# Patient Record
Sex: Male | Born: 1990 | Race: White | Hispanic: No | Marital: Single | Smoking: Never smoker
Health system: Southern US, Community
[De-identification: ages and names within clinical notes are randomized; demographics above are authoritative.]

## PROBLEM LIST (undated history)

## (undated) DIAGNOSIS — A4901 Methicillin susceptible Staphylococcus aureus infection, unspecified site: Secondary | ICD-10-CM

## (undated) DIAGNOSIS — F1910 Other psychoactive substance abuse, uncomplicated: Secondary | ICD-10-CM

---
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
## 2011-08-21 LAB — BASIC METABOLIC PANEL
Chloride: 104 mEq/L (ref 96–112)
Potassium: 4.1 mEq/L (ref 3.5–5.1)
Sodium: 140 mEq/L (ref 135–145)

## 2011-08-21 LAB — DIFFERENTIAL
Eosinophils Relative: 3 % (ref 0–5)
Lymphocytes Relative: 32 % (ref 24–48)
Lymphs Abs: 2.7 10*3/uL (ref 1.1–4.8)
Monocytes Absolute: 0.9 10*3/uL (ref 0.2–1.2)
Monocytes Relative: 11 % (ref 3–11)

## 2011-08-21 LAB — CBC
HCT: 47.5 % (ref 36.0–49.0)
Hemoglobin: 16.1 g/dL — ABNORMAL HIGH (ref 12.0–16.0)
WBC: 8.6 10*3/uL (ref 4.5–13.5)

## 2011-08-21 LAB — OCCULT BLOOD X 1 CARD TO LAB, STOOL: Fecal Occult Bld: NEGATIVE

## 2015-06-10 ENCOUNTER — Encounter (HOSPITAL_BASED_OUTPATIENT_CLINIC_OR_DEPARTMENT_OTHER): Payer: Self-pay | Admitting: *Deleted

## 2015-06-10 ENCOUNTER — Emergency Department (HOSPITAL_BASED_OUTPATIENT_CLINIC_OR_DEPARTMENT_OTHER)
Admission: EM | Admit: 2015-06-10 | Discharge: 2015-06-10 | Disposition: A | Discharge status: Home / Self Care | Payer: Self-pay | Attending: Emergency Medicine | Admitting: Emergency Medicine

## 2015-06-10 DIAGNOSIS — J020 Streptococcal pharyngitis: Secondary | ICD-10-CM | POA: Insufficient documentation

## 2015-06-10 LAB — RAPID STREP SCREEN (MED CTR MEBANE ONLY): STREPTOCOCCUS, GROUP A SCREEN (DIRECT): POSITIVE — AB

## 2015-06-10 MED ORDER — NAPROXEN 500 MG PO TABS: 500.0000 mg | ORAL_TABLET | Freq: Two times a day (BID) | ORAL | Status: DC

## 2015-06-10 MED ORDER — PENICILLIN G BENZATHINE 1200000 UNIT/2ML IM SUSP
1.2000 10*6.[IU] | Freq: Once | INTRAMUSCULAR | Status: AC
  Administered 2015-06-10: 1.2 10*6.[IU] via INTRAMUSCULAR
  Filled 2015-06-10: qty 2

## 2015-06-10 NOTE — ED Notes (Signed)
Sore throat x 2-3 days- recent exposure to strep

## 2015-06-10 NOTE — ED Provider Notes (Signed)
CSN: [id number]     Arrival date & time 06/10/15  1059 History   First MD Initiated Contact with Patient 06/10/15 1101     Chief Complaint  Patient presents with  . Sore Throat   (Consider location/radiation/quality/duration/timing/severity/associated sxs/prior Treatment) HPI Comments: Patient presents with complaint of sore throat for the past 2 days. The pain started on the left side of the throat and as of this morning, is on both sides. Pain is worse with swallowing and when he brushes his teeth. No difficulty breathing. Patient denies fever or chills. No URI symptoms or cough. Pain does not radiate. He was recently around somebody who had strep throat. Has taken Dayquil with some relief. The onset of this condition was acute. The course is constant.   Patient is a 24 y.o. male presenting with pharyngitis. The history is provided by the patient.  Sore Throat Associated symptoms include a sore throat. Pertinent negatives include no abdominal pain, chills, congestion, coughing, fatigue, fever, headaches, myalgias, nausea, rash or vomiting.    History reviewed. No pertinent past medical history. History reviewed. No pertinent past surgical history. No family history on file. History  Substance Use Topics  . Smoking status: Never Smoker   . Smokeless tobacco: Not on file  . Alcohol Use: Yes    Review of Systems  Constitutional: Negative for fever, chills and fatigue.  HENT: Positive for sore throat. Negative for congestion, ear pain, rhinorrhea, sinus pressure and trouble swallowing.   Eyes: Negative for redness.  Respiratory: Negative for cough and wheezing.   Gastrointestinal: Negative for nausea, vomiting, abdominal pain and diarrhea.  Genitourinary: Negative for dysuria.  Musculoskeletal: Negative for myalgias and neck stiffness.  Skin: Negative for rash.  Neurological: Negative for headaches.  Hematological: Negative for adenopathy.    Allergies  Review of patient's  allergies indicates no known allergies.  Home Medications   Prior to Admission medications   Not on File   BP 130/58 mmHg  Pulse 80  Temp(Src) 99 F (37.2 C) (Oral)  Resp 18  Ht 5\' 7"  (1.702 m)  Wt 225 lb (102.059 kg)  BMI 35.23 kg/m2  SpO2 99%   Physical Exam  Constitutional: He appears well-developed and well-nourished.  HENT:  Head: Normocephalic and atraumatic.  Right Ear: Tympanic membrane, external ear and ear canal normal.  Left Ear: Tympanic membrane, external ear and ear canal normal.  Nose: Nose normal. No mucosal edema or rhinorrhea.  Mouth/Throat: Uvula is midline and mucous membranes are normal. Mucous membranes are not dry. No trismus in the jaw. No uvula swelling. Oropharyngeal exudate, posterior oropharyngeal edema and posterior oropharyngeal erythema present. No tonsillar abscesses.  Eyes: Conjunctivae are normal. Right eye exhibits no discharge. Left eye exhibits no discharge.  Neck: Normal range of motion. Neck supple.  Cardiovascular: Normal rate, regular rhythm and normal heart sounds.   Pulmonary/Chest: Effort normal and breath sounds normal. No respiratory distress. He has no wheezes. He has no rales.  Abdominal: Soft. There is no tenderness.  Lymphadenopathy:    He has no cervical adenopathy.  Neurological: He is alert.  Skin: Skin is warm and dry.  Psychiatric: He has a normal mood and affect.  Nursing note and vitals reviewed.   ED Course  Procedures (including critical care time) Labs Review Labs Reviewed  RAPID STREP SCREEN (NOT AT [facility]) - Abnormal; Notable for the following:    Streptococcus, Group A Screen (Direct) POSITIVE (*)    All other components within normal limits  Imaging Review No results found.   EKG Interpretation None       11:17 AM Patient seen and examined. Strep pending. Patient declines medications at this time.   Vital signs reviewed and are as follows: BP 130/58 mmHg  Pulse 80  Temp(Src) 99 F (37.2 C)  (Oral)  Resp 18  Ht  (1.702 m)  Wt 225 lb (102.059 kg)  BMI 35.23 kg/m2  SpO2 99%  11:53 AM Patient informed of positive strep test. Patient counseled on supportive care and s/s to return including worsening symptoms, persistent fever, persistent vomiting, trouble breathing or swallowing, or if they have any other concerns. Urged to see PCP if symptoms persist for more than 3 days. Patient verbalizes understanding and agrees with plan.    MDM   Final diagnoses:  Streptococcal pharyngitis   Patient with sore throat, positive strep test. No peritonsillar abscess seen. No concerning signs for deep space infection in neck. Patient appears well, nontoxic. Treated with IM Bicillin in ED.  Renne Crigler, PA-C 06/10/15 1154  Doug Sou, MD 06/10/15 [phone]

## 2015-06-10 NOTE — Discharge Instructions (Signed)
Please read and follow all provided instructions.  Your diagnoses today include:  1. Streptococcal pharyngitis    Tests performed today include:  Strep test: was POSITIVE for strep throat  Vital signs. See below for your results today.   Medications prescribed:  You were given a one-time shot of penicillin to treat your strep throat.    Naproxen - anti-inflammatory pain medication  Do not exceed  naproxen every 12 hours, take with food  You have been prescribed an anti-inflammatory medication or NSAID. Take with food. Take smallest effective dose for the shortest duration needed for your pain. Stop taking if you experience stomach pain or vomiting.   Take any medications prescribed only as directed.   Home care instructions:  Please read the educational materials provided and follow any instructions contained in this packet.  Follow-up instructions: Please follow-up with your primary care provider as needed for further evaluation of your symptoms.  Return instructions:   Please return to the Emergency Department if you experience worsening symptoms.   Return if you have worsening problems swallowing, your neck becomes swollen, you cannot swallow your saliva or your voice becomes muffled.   Return with high persistent fever, persistent vomiting, or if you have trouble breathing.   Please return if you have any other emergent concerns.  Additional Information:  Your vital signs today were: BP 130/58 mmHg   Pulse 80   Temp(Src) 99 F (37.2 C) (Oral)   Resp 18   Ht  (1.702 m)   Wt 225 lb (102.059 kg)   BMI 35.23 kg/m2   SpO2 99% If your blood pressure (BP) was elevated above 135/85 this visit, please have this repeated by your doctor within one month. --------------

## 2018-08-04 NOTE — ED Provider Notes (Signed)
 History   The history is provided by the patient and the EMS personnel. No language interpreter was used.   Time Seen By Physician(s): 20:34 Helon Dare, MD)  Mode of Arrival: ambulance  Treatment PTA: Narcan  27 y.o.  male pt with who snorted heroin today, half a gram, found in his car unresponsive. No trauma. EMS was called at around 19:30 for cardiac arrest, although when pt was given Narcan his sxs rapidly improved. Found to be hypoxemic to 50% in the field. Pt denies any other ingestion. No SI or HI.   PCP: No PCP reported by pt. Specialist(s): None specified  History reviewed. No pertinent past medical history.  History reviewed. No pertinent surgical history.  Family History:  No family history reported  Additional Information    Social History  Substance Use Topics  . Smoking status: Never Smoker  . Smokeless tobacco: Never Used  . Alcohol  use Yes        Review of Systems  Constitutional: Negative.  Negative for fever.  HENT: Negative.  Negative for sore throat.   Eyes: Negative.  Negative for visual disturbance.  Respiratory: Negative.  Negative for chest tightness and shortness of breath.   Cardiovascular: Negative.  Negative for chest pain.  Gastrointestinal: Negative.  Negative for abdominal pain, blood in stool, constipation, diarrhea, nausea and vomiting.  Endocrine: Negative.   Genitourinary: Negative.  Negative for dysuria, flank pain and hematuria.  Musculoskeletal: Negative.  Negative for back pain, neck pain and neck stiffness.  Skin: Negative.  Negative for rash.  Allergic/Immunologic: Negative.   Neurological: Negative.  Negative for dizziness, syncope, weakness and headaches.  Hematological: Negative.   Psychiatric/Behavioral: Negative.  Negative for confusion.  All other systems reviewed and are negative.   Physical Exam  BP 111/69   Pulse 101   Temp 98 F (36.7 C) (Oral)   Resp 13   Wt 95.5 kg   SpO2 92%   Physical Exam   Constitutional: He appears well-developed and well-nourished.  HENT:  Head: Normocephalic and atraumatic.  Mouth/Throat: Oropharynx is clear and moist.  Eyes: Pupils are equal, round, and reactive to light. Conjunctivae and EOM are normal.  Neck: Normal range of motion. Neck supple. No tracheal deviation present.  Cardiovascular: Regular rhythm and intact distal pulses.  Tachycardia present.  Exam reveals no gallop and no friction rub.   Pulses:      Radial pulses are 2+ on the right side, and 2+ on the left side.       Dorsalis pedis pulses are 2+ on the right side, and 2+ on the left side.       Posterior tibial pulses are 2+ on the right side, and 2+ on the left side.  Pulmonary/Chest: Effort normal and breath sounds normal. No stridor. No respiratory distress. He has no wheezes. He has no rales. He exhibits no tenderness.  O2 sats of RA 84%  Abdominal: Soft. Bowel sounds are normal. He exhibits no distension and no mass. There is no tenderness. There is no rebound and no guarding.  No Pulsatile Abdominal Masses  Musculoskeletal: Normal range of motion. He exhibits no edema, tenderness or deformity.  Lymphadenopathy:    He has no cervical adenopathy.  Neurological: He has normal strength.  Somnolent, but agitated.  Skin: Skin is warm and dry. No rash noted. He is not diaphoretic.  No track marks.  Nursing note and vitals reviewed.   ED Course   ED Course as of Aug 04 2320  Thu Aug 04, 2018  2245 CK Total: (!) 209 [AA]    ED Course User Index [AA] Yancy Dare, MD    Alteplase Stroke Assessment  NIH Stroke Assessment Scale  MDM               Allergies: No Known Allergies   Medications: Previous Medications   No medications on file     Repeat Vitals: Vitals:   08/04/18 2025 08/04/18 2031 08/04/18 2116 08/04/18 2231  BP: 158/72 142/79  111/69  Pulse: 136 132 118 101  Resp: 16 16 21 13   Temp: 98 F (36.7 C)     TempSrc: Oral     SpO2: (!) 84%  96%  92%  Weight:  95.5 kg      ---------------------- MEDICAL DECISION MAKING -----------------------   Vital Signs:  Reviewed the patient's vital signs.   Nursing Notes:  Reviewed and utilized the nursing notes.    Old Medical Records:  The patient's available past medical records and past encounters were reviewed.     ED Orders: Orders Placed This Encounter  Procedures  . X-ray chest 1 view PORTABLE  . CT head without contrast  . CT chest abdomen pelvis with contrast  . CBC auto differential  . CMP (Comprehensive metabolic panel)  . Lipase  . Ethanol level  . Extra Lt Blue Citrate  . Urinalysis complete with reflex cult  . Drug Screen Urine, Qualitative  . Acetaminophen  level  . Salicylate level  . CK  . APTT  . Protime-INR  . Comprehensive metabolic panel  . RT G3 arterial blood gas istat  . Troponin - 0 hour  . Troponin - 2 hours  . Troponin - 6 hours  . Insert peripheral IV or [employer]  . Insert peripheral IV or Access Port     Laboratory Studies:  Ordered and independently reviewed and interpreted laboratory tests.  Abnormal findings are listed below:  Labs Reviewed  CBC W/PLAT AUTOMATED DIFF - Abnormal; Notable for the following:       Result Value   WBC 23.30 (*)    Hematocrit 49.2 (*)    Neutrophils Relative 88.9 (*)    Lymphocytes Relative 5.0 (*)    Neutrophils Absolute 20.8 (*)    Lymphocytes Absolute 1.2 (*)    Basophils Absolute 0.20 (*)    All other components within normal limits  COMPREHENSIVE METABOLIC PANEL - Abnormal; Notable for the following:    Chloride 94 (*)    CO2 33 (*)    Glucose 206 (*)    Creatinine 1.40 (*)    ALT 366 (*)    AST 378 (*)    All other components within normal limits   Narrative:    Hemolysis=<15 Icterus=<2 Lipemia=<20Hemolysis=<15 Icterus=<2 Lipemia=<20  LIPASE - Abnormal; Notable for the following:    Lipase 335 (*)    All other components within normal limits   Narrative:    Hemolysis=<15  Icterus=<2 Lipemia=<20  URINALYSIS COMPLETE WITH REFLEX CULT - Abnormal; Notable for the following:    Clarity, UA Hazy (*)    Protein, UA 2+ (*)    Glucose, UA 1+ (*)    Blood, UA 1+ (*)    Mucus, UA Moderate (*)    All other components within normal limits  RAPID TOX DRUG SCREEN URINE - Abnormal; Notable for the following:    Cocaine Metab Screen Urine Positive (*)    Opiate Screen Urine Positive (*)    Cannabinoid Screen Urine Positive (*)  All other components within normal limits   Narrative:    Presumptive Positive: Drugs present at or above sensitivity limit. Not Detected: Drugs absent or below sensitivity limit. SENSITIVITY LIMITS Barbiturates: 300 ng/ml Benzodiazepine: 300 ng/ml Cocaine: 300 ng/ml Opiates: 300 ng/ml PCP: 25 ng/ml Amphetamine: 1000 ng/ml Cannabinoid: 50 ng/ml Tricyclic Antidepressants: 1000 ng/ml This test provides only a preliminary test result. A more specific alternate chemical method must be used in order to obtain a confirmed analytical result. Call the laboratory if confirmation of presumptive positive result is needed.  CK - Abnormal; Notable for the following:    Total CK 209 (*)    All other components within normal limits   Narrative:    Hemolysis=<15 Icterus=<2 Lipemia=<20Hemolysis=<15 Icterus=<2 Lipemia=<20Hemolysis=<15 Icterus=<2 Lipemia=<20  PROTIME-INR - Abnormal; Notable for the following:    Prothrombin Time 14.6 (*)    All other components within normal limits  RT G3 ARTERIAL BLOOD GAS - Abnormal; Notable for the following:    PH Temp Corrected 7.33 (*)    PCO2 Temp Corrected 53 (*)    PO2 Temp Corrected 130 (*)    pH, Arterial 7.33 (*)    pCO2, Arterial 53 (*)    pO2, Arterial 130 (*)    HCO3, Arterial 28.0 (*)    All other components within normal limits  ETHANOL LEVEL - Normal   Narrative:    Hemolysis=25 Icterus=<2 Lipemia=<20  ACETAMINOPHEN  LEVEL - Normal   Narrative:    Hemolysis=25 Icterus=<2  Lipemia=<20Hemolysis=26 Icterus=<2 Lipemia=<20  SALICYLATE LEVEL - Normal   Narrative:    Hemolysis=25 Icterus=<2 Lipemia=<20Hemolysis=26 Icterus=<2 Lipemia=<20  APTT - Normal  EXTRA LT BLUE CITRATE  TROPONIN     Imaging Studies:   Imaging studies were ordered. Independently interpreted by the Radiologist, which includes: CT head without contrast  Final Result  No acute intracranial abnormality.        Note that CT is insensitive to detection of acute infarction and is  limited in evaluation of the posterior fossa. For persistent or  worsening neurologic symptoms or condition, concern for acute infarction  or posterior fossa symptoms, MRI is recommended.    Approved By: Elsie Jersey  08/04/2018 9:43 PM  [id number]    X-ray chest 1 view PORTABLE  Final Result    No acute cardiopulmonary abnormality observed.    For persistent or worsening symptomatology consider PA and lateral  radiographic follow-up when patient condition allows.    Approved By: Franky Mulders  08/04/2018 9:08 PM  [id number]    CT chest abdomen pelvis with contrast    (Results Pending)     EKG:  None   Medications given in the ED:  Medication Administration from 08/04/2018 2008 to 08/04/2018 2329      Date/Time Order Dose Route Action Action by Comments    08/04/2018 2222 sodium chloride  0.9 % bolus 1,000 mL 0 mL Intravenous Stopped Bobbette Lesches, RN     08/04/2018 2040 sodium chloride  0.9 % bolus 1,000 mL 1,000 mL Intravenous New Bag Bobbette Lesches, RN     08/04/2018 2050 naloxone injection 2 mg 2 mg Injection Given Bobbette Lesches, RN     08/04/2018 2222 sodium chloride  0.9 % bolus 1,000 mL 0 mL Intravenous Stopped Bobbette Lesches, RN     08/04/2018 2116 sodium chloride  0.9 % bolus 1,000 mL 1,000 mL Intravenous New Bag Bobbette Lesches, RN     08/04/2018 2100 ondansetron  (ZOFRAN ) injection 4 mg 4 mg Intravenous Given Bobbette Lesches, RN         ---------------------------  PROGRESS NOTES  ---------------------------   20:34,  Initial exam. Symptoms and medical history reviewed.  22:45, I spoke with admitting physician, Dr. Virgia, regarding all pertinent aspects of the case. This  doctor agrees to accept the patient at this time for admission and will follow all pending labs and imaging.    Consultations:  22:38, Spoke to poison control who recommend trending liver enzymes for the next 6 hours. If they are trending upwards give Acetodone.  22:45.  Discussed case with Dr. Virgia (Medicine) who will admit the patient to [facility] .  ED Physician Core Measures:  None  Critical Care:  None  MDM  27 y.o.  male pt with who snorted heroin today, half a gram, found in his car unresponsive. No trauma. EMS was called at around 19:30 for cardiac arrest, although when pt was given Narcan his sxs rapidly improved. Found to be hypoxemic to 50% in the field. Pt denies any other ingestion. No SI or HI.  Vital signs reviewed: Hypoxic on RA. Tachycardia. Physical examination: somnolent, but agitated. No noted track marks. O2 sat on RA 84%   Assessment and Plan: 27 y/o male pt with heroin ingestion, does not appear to be intravenous. Pt has transaminatis. Will hydrate. Pt given Narcan with decrease in somnolence. No narcan drip necessary at this time. CXR clear. CT head is clear. If pt continues to desat and become hypoxic, he will need to be admitted for observation.         ----------------------------- COUNSELING ------------------------------ Counseling: The emergency provider discussed today's findings, in addition to providing specific details for the plan of care with patient. Questions are answered and there is agreement with the treatment plan. Counseling was provided regarding the diagnosis.  Smoking Cessation: No, patient is not a smoker.  ----------------------- IMPRESSION AND DISPOSITION --------------------   CLINICAL IMPRESSION  1. Transaminitis   2. Hypoxia   3. Opiate  overdose, undetermined intent, initial encounter ([facility])      DISPOSITION Admit to med/surg floor   Prescriptions: N/A  Patient condition:  Stable   SCRIBE ATTESTATION  By signing my name below, I, Rosina Baston, attest that this documentation has been prepared under the direction and in the presence of Yancy Dare, MD  Electronically Signed: Rosina Baston, Scribe. 08/04/2018. 23:30   PHYSICIAN ATTESTATION 08/04/2018 15:14  Yancy Dare: The scribe's documentation has been prepared under my direction and personally reviewed by me in its entirety. I confirm that the note above accurately reflects all work, treatment, procedures, and medical decision making performed by me.     Yancy Dare, MD 08/05/18 [phone]

## 2018-08-04 NOTE — ED Notes (Signed)
 Pt insistent on PO liquid, states cotton mouth. Pt educated on possible nausea and the need for testing and results prior to PO, Pt yelling insistent.  Per MD okay for Pt to have PO. Pt had water, immediately became nauseous emesis X 2, pt provided with IV antiemetic. Will continue to monitor

## 2018-08-04 NOTE — ED Notes (Addendum)
 RT at bedside.

## 2018-08-04 NOTE — Nursing Note (Signed)
 ABG obtained with patient on 2L.

## 2018-08-04 NOTE — ED Notes (Signed)
 Pt more alert/oriented ambulates in room with ease, steady gait. Pt assisted back to bed. Continues to be connected to monitor. Call light within reach. Will continue to monitor

## 2018-08-05 NOTE — Nursing Note (Signed)
 Dr Suzie, radiologist called with CT results.  CN called and gave information to Meghan NP, [facility].  See orders.
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
## 2019-07-27 ENCOUNTER — Ambulatory Visit (HOSPITAL_COMMUNITY)
Admission: EM | Admit: 2019-07-27 | Discharge: 2019-07-27 | Disposition: A | Discharge status: Home / Self Care | Payer: Self-pay | Attending: Internal Medicine | Admitting: Internal Medicine

## 2019-07-27 ENCOUNTER — Other Ambulatory Visit: Payer: Self-pay

## 2019-07-27 ENCOUNTER — Ambulatory Visit (INDEPENDENT_AMBULATORY_CARE_PROVIDER_SITE_OTHER): Payer: Self-pay

## 2019-07-27 ENCOUNTER — Encounter (HOSPITAL_COMMUNITY): Payer: Self-pay

## 2019-07-27 DIAGNOSIS — M869 Osteomyelitis, unspecified: Secondary | ICD-10-CM

## 2019-07-27 HISTORY — DX: Methicillin susceptible Staphylococcus aureus infection, unspecified site: A49.01

## 2019-07-27 NOTE — ED Triage Notes (Signed)
Pt presents with finger on left thumb that has been ongoing for about 6 months; pt states he had a staph infection a few months that may have not cleared up.

## 2019-07-27 NOTE — ED Provider Notes (Signed)
[facility]    CSN: [id number] Arrival date & time: 07/27/19  [place]      History   Chief Complaint Chief Complaint  Patient presents with  . Finger Infection    HPI Jaben Benegas is a 28 y.o. male with no past medical history comes to urgent care with complaints of left thumb pain, swelling and purulent discharge over the past 6 months.  Incident started 6 months ago after he sustained a puncture wound also at work.  He has had 2 rounds of antibiotics.  When he is on antibiotics the discharge subsides only to recur after the antibiotics have been completed.  He denies any fever or chills.  No redness.  No known relieving factors.   HPI  Past Medical History:  Diagnosis Date  . Staph aureus infection     There are no active problems to display for this patient.   History reviewed. No pertinent surgical history.     Home Medications    Prior to Admission medications   Medication Sig Start Date End Date Taking? Authorizing Provider  naproxen (NAPROSYN) 500 MG tablet Take 1 tablet (500 mg total) by mouth 2 (two) times daily. 06/10/15   Carlisle Cater, PA-C    Family History Family History  Family history unknown: Yes    Social History Social History   Tobacco Use  . Smoking status: Never Smoker  Substance Use Topics  . Alcohol use: Yes  . Drug use: Yes    Types: Marijuana     Allergies   Patient has no known allergies.   Review of Systems Review of Systems  Constitutional: Negative.   HENT: Negative.   Respiratory: Negative.   Gastrointestinal: Negative.   Musculoskeletal: Positive for arthralgias.  Skin: Positive for color change and wound.  Neurological: Negative.  Negative for dizziness, weakness, light-headedness and headaches.     Physical Exam Triage Vital Signs ED Triage Vitals  Enc Vitals Group     BP 07/27/19 1857 (!) 148/88     Pulse Rate 07/27/19 1857 98     Resp 07/27/19 1857 16     Temp 07/27/19 1857 98.1 F (36.7  C)     Temp Source 07/27/19 1857 Temporal     SpO2 07/27/19 1857 98 %     Weight --      Height --      Head Circumference --      Peak Flow --      Pain Score 07/27/19 1906 6     Pain Loc --      Pain Edu? --      Excl. in [place]? --    No data found.  Updated Vital Signs BP (!) 148/88 (BP Location: Left Arm)   Pulse 98   Temp 98.1 F (36.7 C) (Temporal)   Resp 16   SpO2 98%   Visual Acuity Right Eye Distance:   Left Eye Distance:   Bilateral Distance:    Right Eye Near:   Left Eye Near:    Bilateral Near:     Physical Exam Constitutional:      General: He is not in acute distress.    Appearance: Normal appearance. He is not ill-appearing or toxic-appearing.  Cardiovascular:     Rate and Rhythm: Normal rate and regular rhythm.     Pulses: Normal pulses.     Heart sounds: Normal heart sounds.  Pulmonary:     Effort: Pulmonary effort is normal.     Breath  sounds: Normal breath sounds.  Musculoskeletal:     Comments: Swelling at the distal phalanx of the left thumb.  There is an open wound with irregular edges and a purulent base.  No left finger nail is intact.  Skin:    General: Skin is warm.     Capillary Refill: Capillary refill takes less than 2 seconds.  Neurological:     General: No focal deficit present.     Mental Status: He is alert and oriented to person, place, and time.      UC Treatments / Results  Labs (all labs ordered are listed, but only abnormal results are displayed) Labs Reviewed - No data to display  EKG   Radiology Dg Hand Complete Left  Result Date: 07/27/2019 CLINICAL DATA:  Ongoing infection, first and third digits EXAM: LEFT HAND - COMPLETE 3+ VIEW COMPARISON:  None. FINDINGS: No fracture or dislocation of the left hand. Joint spaces are well preserved. There is subtle bony erosion of the tufts of the left first and third distal phalanges with overlying soft tissue wounds. IMPRESSION: There is subtle bony erosion of the tufts of  the left first and third distal phalanges with overlying soft tissue wounds. Findings are consistent with osteomyelitis. MRI may be used to further evaluate if desired. Electronically Signed   By: Lauralyn PrimesAlex  Bibbey M.D.   On: 07/27/2019 19:54    Procedures Procedures (including critical care time)  Medications Ordered in UC Medications - No data to display  Initial Impression / Assessment and Plan / UC Course  I have reviewed the triage vital signs and the nursing notes.  Pertinent labs & imaging results that were available during my care of the patient were reviewed by me and considered in my medical decision making (see chart for details).     1.  Left thumb swelling with chronic wound suspicious for underlying osteomyelitis: X-ray of the left hand was remarkable for osteomyelitis involving the distal phalanx of the first and third fingers with overlying soft tissue wounds. Patient was advised to follow-up with the hand surgeon for further evaluation and definitive treatment.  If patient develops further swelling, fever or chills he is advised to go to the emergency department to be evaluated. Final Clinical Impressions(s) / UC Diagnoses   Final diagnoses:  None   Discharge Instructions   None    ED Prescriptions    None     Controlled Substance Prescriptions [place] Controlled Substance Registry consulted? No   Merrilee JanskyLamptey, Philip O, MD 08/01/19 [phone]

## 2021-05-02 ENCOUNTER — Emergency Department (HOSPITAL_COMMUNITY)
Admission: EM | Admit: 2021-05-02 | Discharge: 2021-05-02 | Disposition: A | Discharge status: Home / Self Care | Payer: Self-pay | Attending: Emergency Medicine | Admitting: Emergency Medicine

## 2021-05-02 ENCOUNTER — Emergency Department (HOSPITAL_COMMUNITY): Payer: Self-pay

## 2021-05-02 DIAGNOSIS — W208XXA Other cause of strike by thrown, projected or falling object, initial encounter: Secondary | ICD-10-CM | POA: Insufficient documentation

## 2021-05-02 DIAGNOSIS — S99911A Unspecified injury of right ankle, initial encounter: Secondary | ICD-10-CM | POA: Insufficient documentation

## 2021-05-02 DIAGNOSIS — M899 Disorder of bone, unspecified: Secondary | ICD-10-CM

## 2021-05-02 DIAGNOSIS — Y92009 Unspecified place in unspecified non-institutional (private) residence as the place of occurrence of the external cause: Secondary | ICD-10-CM | POA: Insufficient documentation

## 2021-05-02 DIAGNOSIS — M93271 Osteochondritis dissecans, right ankle and joints of right foot: Secondary | ICD-10-CM | POA: Insufficient documentation

## 2021-05-02 DIAGNOSIS — S0990XA Unspecified injury of head, initial encounter: Secondary | ICD-10-CM

## 2021-05-02 DIAGNOSIS — M949 Disorder of cartilage, unspecified: Secondary | ICD-10-CM

## 2021-05-02 DIAGNOSIS — Y9384 Activity, sleeping: Secondary | ICD-10-CM | POA: Insufficient documentation

## 2021-05-02 DIAGNOSIS — S0101XA Laceration without foreign body of scalp, initial encounter: Secondary | ICD-10-CM | POA: Insufficient documentation

## 2021-05-02 MED ORDER — LIDOCAINE-EPINEPHRINE-TETRACAINE (LET) TOPICAL GEL
3.0000 mL | Freq: Once | TOPICAL | Status: DC
  Filled 2021-05-02: qty 3
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
## 2021-05-02 NOTE — ED Provider Notes (Signed)
Emergency Medicine Provider Triage Evaluation Note  Mike Sellers , a 30 y.o. male  was evaluated in triage.  Pt complains of sudden onset, constant, achy, right ankle pain s/p falling yesterday. Pt is homeless and was sleeping on one of the large shelves at [employer]. He tried to get down in the middle of the night and states that while attempting to go down a cinderblock shifted and fell; hitting him in the top of the head. When he fell down he twisted his ankle in the process. Pt does report he had a lot of bleeding to his scalp however denies LOC. He is not anticoagulated. He states he is mostly worried about his ankle as he has been limping and unable to fully bare weight. Tetanus UTD.  Review of Systems  Positive: + right ankle pain, scalp wound Negative: - HA, vision changes, nausea, vomiting  Physical Exam  BP 135/78 (BP Location: Left Arm)   Pulse 92   Temp 98.4 F (36.9 C) (Oral)   Resp 17   SpO2 100%  Gen:   Awake, no distress   Resp:  Normal effort  MSK:   + swelling and TTP to the right ankle joint; TTP > R malleolus. ROM intact. Strength and sensation intact. 2+ DP pulse.  Other:  Wound to top of scalp on left parietal area; not actively bleeding  Medical Decision Making  Medically screening exam initiated at 11:07 AM.  Appropriate orders placed.  Mike Sellers was informed that the remainder of the evaluation will be completed by another provider, this initial triage assessment does not replace that evaluation, and the importance of remaining in the ED until their evaluation is complete.     Tanda Rockers, PA-C 05/02/21 1110    Rolan Bucco, MD 05/02/21 [phone]

## 2021-05-02 NOTE — Progress Notes (Signed)
Orthopedic Tech Progress Note Patient Details:  Mike Sellers [birth year] [id number]  Ortho Devices Type of Ortho Device: CAM walker Ortho Device/Splint Location: RLE Ortho Device/Splint Interventions: Ordered, Application   Post Interventions Patient Tolerated: Well Instructions Provided: Adjustment of device  [name] 05/02/2021, 1:37 PM

## 2021-05-02 NOTE — ED Provider Notes (Signed)
[facility] EMERGENCY DEPARTMENT Provider Note   CSN: [id number] Arrival date & time: 05/02/21  1041     History No chief complaint on file.   Mike Sellers is a 30 y.o. male who presents to the ED today with complaint of gradual onset, constant, achy/sharp, R ankle pain s/p injury that occurred last night. Pt is homeless and was sleeping on a top shelf at [employer]. He tried to get down in the middle of the night when a large cinderblock shifted and fell, landing onto his head. No LOC and pt is not anticoagulated. In the process pt jumped down and twisted onto his R ankle causing pain. He has been limping since then. Pt also have a laceration to the top of his head however states he is "unconcerned" regarding that. Tetanus UTD. Denies vision changes, nausea, vomiting, confusion.   The history is provided by the patient and medical records.      Past Medical History:  Diagnosis Date   Staph aureus infection     There are no problems to display for this patient.   No past surgical history on file.     Family History  Family history unknown: Yes    Social History   Tobacco Use   Smoking status: Never  Substance Use Topics   Alcohol use: Yes   Drug use: Yes    Types: Marijuana    Home Medications Prior to Admission medications   Medication Sig Start Date End Date Taking? Authorizing Provider  naproxen (NAPROSYN) 500 MG tablet Take 1 tablet (500 mg total) by mouth 2 (two) times daily. 06/10/15   Renne Crigler, PA-C    Allergies    Patient has no known allergies.  Review of Systems   Review of Systems  Constitutional:  Negative for chills and fever.  Musculoskeletal:  Positive for arthralgias and joint swelling.  Skin:  Positive for wound.  Neurological:  Negative for dizziness, syncope, speech difficulty, light-headedness and headaches.  Psychiatric/Behavioral:  Negative for confusion.   All other systems reviewed and are  negative.  Physical Exam Updated Vital Signs BP 135/78 (BP Location: Left Arm)   Pulse 92   Temp 98.4 F (36.9 C) (Oral)   Resp 17   SpO2 100%   Physical Exam Vitals and nursing note reviewed.  Constitutional:      Appearance: He is not ill-appearing or diaphoretic.  HENT:     Head: Normocephalic.     Comments: 1-2 cm laceration to top of scalp; bleeding controlled. No raccoon's sign or battle's sign Eyes:     Conjunctiva/sclera: Conjunctivae normal.  Cardiovascular:     Rate and Rhythm: Normal rate and regular rhythm.     Pulses: Normal pulses.  Pulmonary:     Effort: Pulmonary effort is normal.     Breath sounds: Normal breath sounds. No wheezing, rhonchi or rales.  Musculoskeletal:     Comments: Moderate swelling noted to R ankle with TTP diffusely (greater to latera malleolus). ROM limited s/2 pain/swelling however able to flex and extend at the ankle joint. Sensation intact. 2+ DP pulse.   Skin:    General: Skin is warm and dry.     Coloration: Skin is not jaundiced.  Neurological:     Mental Status: He is alert.    ED Results / Procedures / Treatments   Labs (all labs ordered are listed, but only abnormal results are displayed) Labs Reviewed - No data to display  EKG None  Radiology  DG Ankle Complete Right  Result Date: 05/02/2021 CLINICAL DATA:  Patient status fresh that right ankle pain due to an injury suffered in a fall yesterday. Initial encounter. EXAM: RIGHT ANKLE - COMPLETE 3+ VIEW COMPARISON:  Plain films right ankle 12/30/2006. FINDINGS: There is soft tissue swelling about the ankle which is worse on the lateral side. Osteophytosis is seen off both the medial and lateral malleoli. A 0.4 cm lucency in the lateral talar dome is consistent with an osteochondral defect and new since the prior examination. Exostosis off the dorsal neck of the talus is increased in size since the prior study. IMPRESSION: Soft tissue swelling about the ankle.  Negative for  fracture. Small osteochondral lesion lateral talar dome is age indeterminate and new since the prior examination. 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 Cause for this finding is not identified. 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 Electronically Signed   By: Drusilla Kanner M.D.   On: 05/02/2021 11:40    Procedures .Marland KitchenLaceration Repair  Date/Time: 05/02/2021 1:26 PM Performed by: Tanda Rockers, PA-C Authorized by: Tanda Rockers, PA-C   Consent:    Consent obtained:  Verbal   Consent given by:  Patient   Risks discussed:  Infection, pain and poor cosmetic result Anesthesia:    Anesthesia method:  Topical application   Topical anesthetic:  LET Laceration details:    Location:  Scalp   Scalp location:  Mid-scalp   Length (cm):  2   Depth (mm):  2 Pre-procedure details:    Preparation:  Patient was prepped and draped in usual sterile fashion Treatment:    Area cleansed with:  Povidone-iodine   Irrigation solution:  Sterile saline Skin repair:    Repair method:  Staples   Number of staples:  3 Approximation:    Approximation:  Close Repair type:    Repair type:  Simple Post-procedure details:    Dressing:  Open (no dressing)   Procedure completion:  Tolerated well, no immediate complications   Medications Ordered in ED Medications  lidocaine-EPINEPHrine-tetracaine (LET) topical gel (has no administration in time range)    ED Course  I have reviewed the triage vital signs and the nursing notes.  Pertinent labs & imaging results that were available during my care of the patient were reviewed by me and considered in my medical decision making (see chart for details).    MDM Rules/Calculators/A&P                          30 year old male who presents to the ED today after sustaining a head injury when a cinder block fell on his head while he was going down a large shelf at [employer] where he was sleeping.  He is homeless.  Also  complaining of right ankle pain.  On arrival to the ED vitals are stable patient appears to be in no acute distress.  He does have a 1 to 2 cm laceration to the top of the scalp, not actively bleeding.  He denies any head injury or loss of consciousness, is not anticoagulated.  No signs for basilar skull fracture.  Do not feel he needs CT head at this time however will likely need staples.  Laceration about 12 hours out now.  Also has tenderness palpation and swelling to the right ankle.  We will plan for x-ray.  Xray: IMPRESSION:  Soft tissue swelling about the ankle.  Negative for fracture.  Small osteochondral lesion lateral talar dome is age indeterminate  and new since the prior examination.     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 Cause for this finding is  not identified.     Exostosis off the dorsal neck of the talus is increased in size  since 2008.   Discussed case with orthopedist Earney Hamburg, PA-C who recommends cam walker and weightbearing as tolerated and follow-up with Dr. Dion Saucier in the office.   Laceration repaired with staples. Pt instructed to return for staple removal in 1 weeks time. He will also need to follow up with orthopedics. RICE therapy discussed with pt. He is in agreement with plan and stable for discharge.   This note was prepared using Dragon voice recognition software and may include unintentional dictation errors due to the inherent limitations of voice recognition software.  Final Clinical Impression(s) / ED Diagnoses Final diagnoses:  Injury of head, initial encounter  Laceration of scalp, initial encounter  Injury of right ankle, initial encounter  Osteochondral lesion of talar dome    Rx / DC Orders ED Discharge Orders     None        Discharge Instructions      Return to the ED in 7 days for staple removal.  Return sooner for any signs of infection including redness/swelling around the wound, drainage of pus, fevers > 100.4.    You will also need to follow up with Dr. Dion Saucier Orthopedist for further evaluation of your ankle injury. It is recommended that you wear the boot and weight bare as tolerated until you can be seen by him or his team.   Please rest, ice, and elevate your ankle to help reduce pain/swelling. You can take Ibuprofen and Tylenol as needed for pain.   Return to the ED for any new/worsening symptoms.        Tanda Rockers, PA-C 05/02/21 1330    Rolan Bucco, MD 05/02/21 [phone]

## 2021-05-02 NOTE — ED Triage Notes (Signed)
Pt  here with c/o right ankle sprain and head injury after getting hit in the head with a cinder bloc, no loc

## 2021-05-02 NOTE — Discharge Instructions (Addendum)
Return to the ED in 7 days for staple removal.  Return sooner for any signs of infection including redness/swelling around the wound, drainage of pus, fevers > 100.4.   You will also need to follow up with Dr. Dion Saucier Orthopedist for further evaluation of your ankle injury. It is recommended that you wear the boot and weight bare as tolerated until you can be seen by him or his team.   Please rest, ice, and elevate your ankle to help reduce pain/swelling. You can take Ibuprofen and Tylenol as needed for pain.   Return to the ED for any new/worsening symptoms.

## 2021-05-14 ENCOUNTER — Other Ambulatory Visit: Payer: Self-pay

## 2021-05-14 ENCOUNTER — Encounter (HOSPITAL_COMMUNITY): Payer: Self-pay | Admitting: Emergency Medicine

## 2021-05-14 ENCOUNTER — Emergency Department (HOSPITAL_COMMUNITY): Payer: Self-pay

## 2021-05-14 ENCOUNTER — Inpatient Hospital Stay (HOSPITAL_COMMUNITY)
Admission: EM | Admit: 2021-05-14 | Discharge: 2021-05-20 | DRG: 854 | Disposition: A | Discharge status: Home / Self Care | Payer: Self-pay | Attending: Internal Medicine | Admitting: Internal Medicine

## 2021-05-14 DIAGNOSIS — Z6829 Body mass index (BMI) 29.0-29.9, adult: Secondary | ICD-10-CM

## 2021-05-14 DIAGNOSIS — A419 Sepsis, unspecified organism: Principal | ICD-10-CM

## 2021-05-14 DIAGNOSIS — Z20822 Contact with and (suspected) exposure to covid-19: Secondary | ICD-10-CM | POA: Diagnosis present

## 2021-05-14 DIAGNOSIS — L03115 Cellulitis of right lower limb: Secondary | ICD-10-CM

## 2021-05-14 DIAGNOSIS — T8140XA Infection following a procedure, unspecified, initial encounter: Secondary | ICD-10-CM | POA: Diagnosis not present

## 2021-05-14 DIAGNOSIS — F17210 Nicotine dependence, cigarettes, uncomplicated: Secondary | ICD-10-CM | POA: Diagnosis present

## 2021-05-14 DIAGNOSIS — E876 Hypokalemia: Secondary | ICD-10-CM | POA: Diagnosis present

## 2021-05-14 DIAGNOSIS — L02415 Cutaneous abscess of right lower limb: Secondary | ICD-10-CM

## 2021-05-14 LAB — CBC WITH DIFFERENTIAL/PLATELET
Abs Immature Granulocytes: 0.15 10*3/uL — ABNORMAL HIGH (ref 0.00–0.07)
Basophils Absolute: 0 10*3/uL (ref 0.0–0.1)
Basophils Relative: 0 %
Eosinophils Absolute: 0.2 10*3/uL (ref 0.0–0.5)
Eosinophils Relative: 1 %
HCT: 47.9 % (ref 39.0–52.0)
Hemoglobin: 15.5 g/dL (ref 13.0–17.0)
Immature Granulocytes: 1 %
Lymphocytes Relative: 6 %
Lymphs Abs: 1.2 10*3/uL (ref 0.7–4.0)
MCH: 28.5 pg (ref 26.0–34.0)
MCHC: 32.4 g/dL (ref 30.0–36.0)
MCV: 88.2 fL (ref 80.0–100.0)
Monocytes Absolute: 1.1 10*3/uL — ABNORMAL HIGH (ref 0.1–1.0)
Monocytes Relative: 5 %
Neutro Abs: 17.7 10*3/uL — ABNORMAL HIGH (ref 1.7–7.7)
Neutrophils Relative %: 87 %
Platelets: 287 10*3/uL (ref 150–400)
RBC: 5.43 MIL/uL (ref 4.22–5.81)
RDW: 13 % (ref 11.5–15.5)
WBC: 20.4 10*3/uL — ABNORMAL HIGH (ref 4.0–10.5)
nRBC: 0 % (ref 0.0–0.2)

## 2021-05-14 LAB — BASIC METABOLIC PANEL
Anion gap: 11 (ref 5–15)
BUN: 8 mg/dL (ref 6–20)
CO2: 26 mmol/L (ref 22–32)
Calcium: 9.3 mg/dL (ref 8.9–10.3)
Chloride: 101 mmol/L (ref 98–111)
Creatinine, Ser: 0.91 mg/dL (ref 0.61–1.24)
GFR, Estimated: >60 mL/min (ref >60)
Glucose, Bld: 82 mg/dL (ref 70–99)
Potassium: 4.1 mmol/L (ref 3.5–5.1)
Sodium: 138 mmol/L (ref 135–145)

## 2021-05-14 LAB — LACTIC ACID, PLASMA: Lactic Acid, Venous: 1.3 mmol/L (ref 0.5–1.9)

## 2021-05-14 MED ORDER — PIPERACILLIN-TAZOBACTAM 3.375 G IVPB 30 MIN
3.3750 g | Freq: Once | INTRAVENOUS | Status: AC
  Administered 2021-05-14: 3.375 g via INTRAVENOUS
  Filled 2021-05-14: qty 50

## 2021-05-14 MED ORDER — LACTATED RINGERS IV SOLN: INTRAVENOUS | Status: DC

## 2021-05-14 MED ORDER — ACETAMINOPHEN 325 MG PO TABS
650.0000 mg | ORAL_TABLET | Freq: Once | ORAL | Status: AC
  Administered 2021-05-15: 650 mg via ORAL
  Filled 2021-05-14: qty 2

## 2021-05-14 MED ORDER — VANCOMYCIN HCL 1500 MG/300ML IV SOLN
1500.0000 mg | Freq: Once | INTRAVENOUS | Status: AC
  Administered 2021-05-15: 1500 mg via INTRAVENOUS
  Filled 2021-05-14: qty 300

## 2021-05-14 NOTE — ED Provider Notes (Signed)
Emergency Medicine Provider Triage Evaluation Note  Mike Sellers , a 30 y.o. male  was evaluated in triage.  Pt complains of wound in his right shin.  He was seen for an accident on 6/17 and had a scratch there.  He states that over the past few days it has been red, swollen, increasingly painful and has been draining.  The draining increased today.  He denies any known fevers at home.   Review of Systems  Positive: Wound on right leg Negative: Fevers  Physical Exam  BP 124/70   Pulse (!) 107   Temp 98.4 F (36.9 C) (Oral)   Resp 16   Ht 5\' 7"  (1.702 m)   Wt 86.2 kg   SpO2 100%   BMI 29.76 kg/m  Gen:   Awake, no distress   Resp:  Normal effort  MSK:   Moves extremities without difficulty  Other:  There is wound with purulent drainage on right anterior lower leg.    Medical Decision Making  Medically screening exam initiated at 7:28 PM.  Appropriate orders placed.  Mike Sellers was informed that the remainder of the evaluation will be completed by another provider, this initial triage assessment does not replace that evaluation, and the importance of remaining in the ED until their evaluation is complete.  Note: Portions of this report may have been transcribed using voice recognition software. Every effort was made to ensure accuracy; however, inadvertent computerized transcription errors may be present    Nicholos Johns, PA-C 05/14/21 1929    05/16/21, DO 05/14/21 2130

## 2021-05-14 NOTE — ED Triage Notes (Signed)
Pt arrive POV from home to have a right leg wound check pt states he was seen for an accident 6/17 and he had a scratch there and no looks infected.

## 2021-05-14 NOTE — Sepsis Progress Note (Signed)
Monitoring for code sepsis protocol. 

## 2021-05-15 ENCOUNTER — Inpatient Hospital Stay (HOSPITAL_COMMUNITY): Payer: Self-pay

## 2021-05-15 ENCOUNTER — Encounter (HOSPITAL_COMMUNITY): Payer: Self-pay | Admitting: Family Medicine

## 2021-05-15 DIAGNOSIS — L03115 Cellulitis of right lower limb: Secondary | ICD-10-CM

## 2021-05-15 DIAGNOSIS — R609 Edema, unspecified: Secondary | ICD-10-CM

## 2021-05-15 LAB — COMPREHENSIVE METABOLIC PANEL
ALT: 13 U/L (ref 0–44)
AST: 15 U/L (ref 15–41)
Albumin: 3.5 g/dL (ref 3.5–5.0)
Alkaline Phosphatase: 73 U/L (ref 38–126)
Anion gap: 9 (ref 5–15)
BUN: 10 mg/dL (ref 6–20)
CO2: 25 mmol/L (ref 22–32)
Calcium: 9 mg/dL (ref 8.9–10.3)
Chloride: 101 mmol/L (ref 98–111)
Creatinine, Ser: 0.89 mg/dL (ref 0.61–1.24)
GFR, Estimated: >60 mL/min (ref >60)
Glucose, Bld: 87 mg/dL (ref 70–99)
Potassium: 3.5 mmol/L (ref 3.5–5.1)
Sodium: 135 mmol/L (ref 135–145)
Total Bilirubin: 0.6 mg/dL (ref 0.3–1.2)
Total Protein: 6.7 g/dL (ref 6.5–8.1)

## 2021-05-15 LAB — URINALYSIS, ROUTINE W REFLEX MICROSCOPIC
Bacteria, UA: NONE SEEN
Bilirubin Urine: NEGATIVE
Glucose, UA: NEGATIVE mg/dL
Hgb urine dipstick: NEGATIVE
Ketones, ur: 5 mg/dL — AB
Leukocytes,Ua: NEGATIVE
Nitrite: NEGATIVE
Protein, ur: 30 mg/dL — AB
Specific Gravity, Urine: 1.039 — ABNORMAL HIGH (ref 1.005–1.030)
pH: 5 (ref 5.0–8.0)

## 2021-05-15 LAB — APTT: aPTT: 33 seconds (ref 24–36)

## 2021-05-15 LAB — RESP PANEL BY RT-PCR (FLU A&B, COVID) ARPGX2
Influenza A by PCR: NEGATIVE
Influenza B by PCR: NEGATIVE
SARS Coronavirus 2 by RT PCR: NEGATIVE

## 2021-05-15 LAB — CBC
HCT: 41.6 % (ref 39.0–52.0)
Hemoglobin: 13.8 g/dL (ref 13.0–17.0)
MCH: 28.9 pg (ref 26.0–34.0)
MCHC: 33.2 g/dL (ref 30.0–36.0)
MCV: 87.2 fL (ref 80.0–100.0)
Platelets: 249 10*3/uL (ref 150–400)
RBC: 4.77 MIL/uL (ref 4.22–5.81)
RDW: 13.3 % (ref 11.5–15.5)
WBC: 19.9 10*3/uL — ABNORMAL HIGH (ref 4.0–10.5)
nRBC: 0 % (ref 0.0–0.2)

## 2021-05-15 LAB — LACTIC ACID, PLASMA
Lactic Acid, Venous: 1.5 mmol/L (ref 0.5–1.9)
Lactic Acid, Venous: 2.2 mmol/L (ref 0.5–1.9)

## 2021-05-15 LAB — BASIC METABOLIC PANEL
Anion gap: 9 (ref 5–15)
BUN: 7 mg/dL (ref 6–20)
CO2: 24 mmol/L (ref 22–32)
Calcium: 8.5 mg/dL — ABNORMAL LOW (ref 8.9–10.3)
Chloride: 103 mmol/L (ref 98–111)
Creatinine, Ser: 0.81 mg/dL (ref 0.61–1.24)
GFR, Estimated: >60 mL/min (ref >60)
Glucose, Bld: 118 mg/dL — ABNORMAL HIGH (ref 70–99)
Potassium: 3.3 mmol/L — ABNORMAL LOW (ref 3.5–5.1)
Sodium: 136 mmol/L (ref 135–145)

## 2021-05-15 LAB — PROTIME-INR
INR: 1.2 (ref 0.8–1.2)
Prothrombin Time: 15.2 seconds (ref 11.4–15.2)

## 2021-05-15 LAB — HIV ANTIBODY (ROUTINE TESTING W REFLEX): HIV Screen 4th Generation wRfx: NONREACTIVE

## 2021-05-15 MED ORDER — LACTATED RINGERS IV SOLN: INTRAVENOUS | Status: AC

## 2021-05-15 MED ORDER — ACETAMINOPHEN 650 MG RE SUPP: 650.0000 mg | Freq: Four times a day (QID) | RECTAL | Status: DC | PRN

## 2021-05-15 MED ORDER — GADOBUTROL 1 MMOL/ML IV SOLN
9.0000 mL | Freq: Once | INTRAVENOUS | Status: AC | PRN
  Administered 2021-05-15: 9 mL via INTRAVENOUS

## 2021-05-15 MED ORDER — ACETAMINOPHEN 325 MG PO TABS: 650.0000 mg | ORAL_TABLET | Freq: Four times a day (QID) | ORAL | Status: DC | PRN

## 2021-05-15 MED ORDER — SENNOSIDES-DOCUSATE SODIUM 8.6-50 MG PO TABS: 1.0000 | ORAL_TABLET | Freq: Every evening | ORAL | Status: DC | PRN

## 2021-05-15 MED ORDER — LACTATED RINGERS IV BOLUS
1000.0000 mL | Freq: Once | INTRAVENOUS | Status: AC
  Administered 2021-05-15: 1000 mL via INTRAVENOUS

## 2021-05-15 MED ORDER — POTASSIUM CHLORIDE CRYS ER 20 MEQ PO TBCR
40.0000 meq | EXTENDED_RELEASE_TABLET | Freq: Once | ORAL | Status: AC
  Administered 2021-05-15: 40 meq via ORAL
  Filled 2021-05-15: qty 2

## 2021-05-15 MED ORDER — HYDROMORPHONE HCL 1 MG/ML IJ SOLN
0.5000 mg | INTRAMUSCULAR | Status: DC | PRN
  Administered 2021-05-15 – 2021-05-19 (×5): 0.5 mg via INTRAVENOUS
  Filled 2021-05-15 (×2): qty 0.5
  Filled 2021-05-15: qty 1
  Filled 2021-05-15 (×3): qty 0.5

## 2021-05-15 MED ORDER — VANCOMYCIN HCL 1250 MG/250ML IV SOLN
1250.0000 mg | Freq: Two times a day (BID) | INTRAVENOUS | Status: DC
  Administered 2021-05-15 – 2021-05-16 (×3): 1250 mg via INTRAVENOUS
  Filled 2021-05-15 (×4): qty 250

## 2021-05-15 MED ORDER — LACTATED RINGERS IV SOLN: INTRAVENOUS | Status: DC

## 2021-05-15 MED ORDER — ENOXAPARIN SODIUM 40 MG/0.4ML IJ SOSY
40.0000 mg | PREFILLED_SYRINGE | Freq: Every day | INTRAMUSCULAR | Status: DC
  Administered 2021-05-15 – 2021-05-20 (×6): 40 mg via SUBCUTANEOUS
  Filled 2021-05-15 (×6): qty 0.4

## 2021-05-15 MED ORDER — HYDROCODONE-ACETAMINOPHEN 5-325 MG PO TABS
1.0000 | ORAL_TABLET | ORAL | Status: DC | PRN
  Administered 2021-05-15: 2 via ORAL
  Administered 2021-05-15: 1 via ORAL
  Administered 2021-05-16 – 2021-05-20 (×10): 2 via ORAL
  Filled 2021-05-15 (×6): qty 2
  Filled 2021-05-15: qty 1
  Filled 2021-05-15 (×5): qty 2

## 2021-05-15 NOTE — Sepsis Progress Note (Signed)
Notified bedside nurse of need to draw repeat lactic acid. 

## 2021-05-15 NOTE — ED Notes (Signed)
Dr. Opyd at bedside  

## 2021-05-15 NOTE — Sepsis Progress Note (Signed)
Notified provider of need to order repeat lactic acid. ° °

## 2021-05-15 NOTE — Progress Notes (Signed)
Lower extremity venous has been completed.   Preliminary results in CV Proc.   Blanch Media 05/15/2021 10:08 AM

## 2021-05-15 NOTE — Progress Notes (Signed)
Pharmacy Antibiotic Note  Mike Sellers is a 30 y.o. male admitted on 05/14/2021 with RLE cellulitis.  Pharmacy has been consulted for Vancomycin  dosing.  Vancomycin 1500 mg IV given in ED at  midnight  Plan: Vancomycin 1250 mg IV q12h  Height: 5\' 7"  (170.2 cm) Weight: 86.2 kg (190 lb) IBW/kg (Calculated) : 66.1  Temp (24hrs), Avg:99.8 F (37.7 C), Min:98.4 F (36.9 C), Max:100.6 F (38.1 C)  Recent Labs  Lab 05/14/21 2020 05/14/21 2339  WBC 20.4*  --   CREATININE 0.91 0.89  LATICACIDVEN 1.3 2.2*    Estimated Creatinine Clearance: 129.5 mL/min (by C-G formula based on SCr of 0.89 mg/dL).    No Known Allergies   05/16/21 05/15/2021 2:59 AM

## 2021-05-15 NOTE — ED Provider Notes (Addendum)
[facility] EMERGENCY DEPARTMENT Provider Note   CSN: [id number] Arrival date & time: 05/14/21  1725     History Chief Complaint  Patient presents with   Wound Check    Mike Sellers is a 30 y.o. male.  The history is provided by the patient.  Wound Check Pertinent negatives include no chest pain.  Illness Location:  Right lower extremity distal to the knee Quality:  Redness and swelling Severity:  Severe Onset quality:  Gradual Duration:  2 months Timing:  Constant Progression:  Worsening Chronicity:  New Context:  History of staph aureus Relieved by:  Nothing Worsened by:  Time Ineffective treatments:  None Associated symptoms: fever and myalgias   Associated symptoms: no chest pain, no diarrhea, no rhinorrhea, no vomiting and no wheezing       Past Medical History:  Diagnosis Date   Staph aureus infection     Patient Active Problem List   Diagnosis Date Noted   Cellulitis of right leg 05/15/2021    History reviewed. No pertinent surgical history.     Family History  Family history unknown: Yes    Social History   Tobacco Use   Smoking status: Never  Substance Use Topics   Alcohol use: Yes   Drug use: Yes    Types: Marijuana    Home Medications Prior to Admission medications   Medication Sig Start Date End Date Taking? Authorizing Provider  naproxen (NAPROSYN) 500 MG tablet Take 1 tablet (500 mg total) by mouth 2 (two) times daily. 06/10/15   Renne Crigler, PA-C    Allergies    Patient has no known allergies.  Review of Systems   Review of Systems  Constitutional:  Positive for fever.  HENT:  Negative for drooling and rhinorrhea.   Eyes:  Negative for redness.  Respiratory:  Negative for wheezing.   Cardiovascular:  Positive for leg swelling. Negative for chest pain.  Gastrointestinal:  Negative for diarrhea and vomiting.  Genitourinary:  Negative for difficulty urinating.  Musculoskeletal:  Positive for  myalgias.  Skin:  Positive for color change and wound.  Neurological:  Negative for facial asymmetry.  Psychiatric/Behavioral:  Negative for agitation.   All other systems reviewed and are negative.  Physical Exam Updated Vital Signs BP 124/72   Pulse (!) 102   Temp (!) 100.6 F (38.1 C) (Oral)   Resp 13   Ht 5\' 7"  (1.702 m)   Wt 86.2 kg   SpO2 100%   BMI 29.76 kg/m   Physical Exam Vitals and nursing note reviewed.  Constitutional:      Appearance: Normal appearance. He is not diaphoretic.  HENT:     Head: Normocephalic and atraumatic.     Nose: Nose normal.  Eyes:     Conjunctiva/sclera: Conjunctivae normal.     Pupils: Pupils are equal, round, and reactive to light.  Cardiovascular:     Rate and Rhythm: Normal rate and regular rhythm.     Pulses: Normal pulses.     Heart sounds: Normal heart sounds.  Pulmonary:     Effort: Pulmonary effort is normal. No respiratory distress.     Breath sounds: Normal breath sounds.  Abdominal:     General: Abdomen is flat. Bowel sounds are normal.     Palpations: Abdomen is soft.     Tenderness: There is no abdominal tenderness.  Musculoskeletal:     Cervical back: Normal range of motion and neck supple.  Skin:    Capillary Refill: Capillary  refill takes less than 2 seconds.       Neurological:     General: No focal deficit present.     Mental Status: He is alert and oriented to person, place, and time.  Psychiatric:        Mood and Affect: Mood normal.        Behavior: Behavior normal.    ED Results / Procedures / Treatments   Labs (all labs ordered are listed, but only abnormal results are displayed) Results for orders placed or performed during the hospital encounter of 05/14/21  Basic metabolic panel  Result Value Ref Range   Sodium 138 135 - 145 mmol/L   Potassium 4.1 3.5 - 5.1 mmol/L   Chloride 101 98 - 111 mmol/L   CO2 26 22 - 32 mmol/L   Glucose, Bld 82 70 - 99 mg/dL   BUN 8 6 - 20 mg/dL   Creatinine, Ser  2.11 0.61 - 1.24 mg/dL   Calcium 9.3 8.9 - 94.1 mg/dL   GFR, Estimated >74 >08 mL/min   Anion gap 11 5 - 15  CBC with Differential  Result Value Ref Range   WBC 20.4 (H) 4.0 - 10.5 K/uL   RBC 5.43 4.22 - 5.81 MIL/uL   Hemoglobin 15.5 13.0 - 17.0 g/dL   HCT 14.4 81.8 - 56.3 %   MCV 88.2 80.0 - 100.0 fL   MCH 28.5 26.0 - 34.0 pg   MCHC 32.4 30.0 - 36.0 g/dL   RDW 14.9 70.2 - 63.7 %   Platelets 287 150 - 400 K/uL   nRBC 0.0 0.0 - 0.2 %   Neutrophils Relative % 87 %   Neutro Abs 17.7 (H) 1.7 - 7.7 K/uL   Lymphocytes Relative 6 %   Lymphs Abs 1.2 0.7 - 4.0 K/uL   Monocytes Relative 5 %   Monocytes Absolute 1.1 (H) 0.1 - 1.0 K/uL   Eosinophils Relative 1 %   Eosinophils Absolute 0.2 0.0 - 0.5 K/uL   Basophils Relative 0 %   Basophils Absolute 0.0 0.0 - 0.1 K/uL   Immature Granulocytes 1 %   Abs Immature Granulocytes 0.15 (H) 0.00 - 0.07 K/uL  Lactic acid, plasma  Result Value Ref Range   Lactic Acid, Venous 1.3 0.5 - 1.9 mmol/L  Lactic acid, plasma  Result Value Ref Range   Lactic Acid, Venous 2.2 (HH) 0.5 - 1.9 mmol/L  Comprehensive metabolic panel  Result Value Ref Range   Sodium 135 135 - 145 mmol/L   Potassium 3.5 3.5 - 5.1 mmol/L   Chloride 101 98 - 111 mmol/L   CO2 25 22 - 32 mmol/L   Glucose, Bld 87 70 - 99 mg/dL   BUN 10 6 - 20 mg/dL   Creatinine, Ser 8.58 0.61 - 1.24 mg/dL   Calcium 9.0 8.9 - 85.0 mg/dL   Total Protein 6.7 6.5 - 8.1 g/dL   Albumin 3.5 3.5 - 5.0 g/dL   AST 15 15 - 41 U/L   ALT 13 0 - 44 U/L   Alkaline Phosphatase 73 38 - 126 U/L   Total Bilirubin 0.6 0.3 - 1.2 mg/dL   GFR, Estimated >27 >74 mL/min   Anion gap 9 5 - 15  Protime-INR  Result Value Ref Range   Prothrombin Time 15.2 11.4 - 15.2 seconds   INR 1.2 0.8 - 1.2  APTT  Result Value Ref Range   aPTT 33 24 - 36 seconds   DG Tibia/Fibula Right  Result Date: 05/14/2021  CLINICAL DATA:  Right leg wound.  Infection. EXAM: RIGHT TIBIA AND FIBULA - 2 VIEW COMPARISON:  05/02/2020 FINDINGS:  Soft tissue swelling anteriorly. No radiopaque foreign bodies or soft tissue gas. No bone destruction or acute bony abnormality. IMPRESSION: Soft tissue swelling within the right calf. No acute bony abnormality. Electronically Signed   By: Charlett NoseKevin  Dover M.D.   On: 05/14/2021 23:30   DG Ankle Complete Right  Result Date: 05/02/2021 CLINICAL DATA:  Patient status fresh that right ankle pain due to an injury suffered in a fall yesterday. Initial encounter. EXAM: RIGHT ANKLE - COMPLETE 3+ VIEW COMPARISON:  Plain films right ankle 12/30/2006. FINDINGS: There is soft tissue swelling about the ankle which is worse on the lateral side. Osteophytosis is seen off both the medial and lateral malleoli. A 0.4 cm lucency in the lateral talar dome is consistent with an osteochondral defect and new since the prior examination. Exostosis off the dorsal neck of the talus is increased in size since the prior study. IMPRESSION: Soft tissue swelling about the ankle.  Negative for fracture. Small osteochondral lesion lateral talar dome is age indeterminate and new since the prior examination. 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 Cause for this finding is not identified. 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 Electronically Signed   By: Drusilla Kannerhomas  Dalessio M.D.   On: 05/02/2021 11:40   DG Chest Port 1 View  Result Date: 05/14/2021 CLINICAL DATA:  Questionable sepsis. EXAM: PORTABLE CHEST 1 VIEW COMPARISON:  October 23, 2008 FINDINGS: The heart size and mediastinal contours are within normal limits. Both lungs are clear. The visualized skeletal structures are unremarkable. IMPRESSION: No active disease. Electronically Signed   By: Aram Candelahaddeus  Houston M.D.   On: 05/14/2021 23:36     EKG EKG Interpretation  Date/Time:  Wednesday May 14 2021 23:47:14 EDT Ventricular Rate:  98 PR Interval:  132 QRS Duration: 95 QT Interval:  308 QTC Calculation: 394 R Axis:   86 Text Interpretation: Sinus  rhythm Probable left atrial enlargement RSR' in V1 or V2, probably normal variant Confirmed by Nicanor AlconPalumbo, Rozelle Caudle ([id number]) on 05/15/2021 12:16:40 AM  Radiology DG Tibia/Fibula Right  Result Date: 05/14/2021 CLINICAL DATA:  Right leg wound.  Infection. EXAM: RIGHT TIBIA AND FIBULA - 2 VIEW COMPARISON:  05/02/2020 FINDINGS: Soft tissue swelling anteriorly. No radiopaque foreign bodies or soft tissue gas. No bone destruction or acute bony abnormality. IMPRESSION: Soft tissue swelling within the right calf. No acute bony abnormality. Electronically Signed   By: Charlett NoseKevin  Dover M.D.   On: 05/14/2021 23:30   DG Chest Port 1 View  Result Date: 05/14/2021 CLINICAL DATA:  Questionable sepsis. EXAM: PORTABLE CHEST 1 VIEW COMPARISON:  October 23, 2008 FINDINGS: The heart size and mediastinal contours are within normal limits. Both lungs are clear. The visualized skeletal structures are unremarkable. IMPRESSION: No active disease. Electronically Signed   By: Aram Candelahaddeus  Houston M.D.   On: 05/14/2021 23:36    Procedures Procedures   Medications Ordered in ED Medications  vancomycin (VANCOREADY) IVPB 1500 mg/300 mL (1,500 mg Intravenous New Bag/Given 05/15/21 0004)  lactated ringers infusion ( Intravenous New Bag/Given 05/15/21 0002)  lactated ringers bolus 1,000 mL (has no administration in time range)  piperacillin-tazobactam (ZOSYN) IVPB 3.375 g (3.375 g Intravenous New Bag/Given 05/14/21 2350)  acetaminophen (TYLENOL) tablet 650 mg (650 mg Oral Given 05/15/21 0008)    ED Course  I have reviewed the triage vital signs and the nursing notes.  Pertinent  labs & imaging results that were available during my care of the patient were reviewed by me and considered in my medical decision making (see chart for details).   MDM Reviewed: nursing note, vitals and previous chart Interpretation: labs, ECG and x-ray (elevated white count 20.4, normal renal function, normal CXR by me no gas on leg XR) Total time providing  critical care: 30-74 minutes (multiple antibiotics and bolus, sepsis). This excludes time spent performing separately reportable procedures and services. Consults: admitting MD CRITICAL CARE Performed by: Lurena Naeve K Erdine Hulen-Rasch Total critical care time: 60 minutes Critical care time was exclusive of separately billable procedures and treating other patients. Critical care was necessary to treat or prevent imminent or life-threatening deterioration. Critical care was time spent personally by me on the following activities: development of treatment plan with patient and/or surrogate as well as nursing, discussions with consultants, evaluation of patient's response to treatment, examination of patient, obtaining history from patient or surrogate, ordering and performing treatments and interventions, ordering and review of laboratory studies, ordering and review of radiographic studies, pulse oximetry and re-evaluation of patient's condition.     No hypotension or end organ dysfunction will not give 30 cc bolus at this time.  Mike Sellers was evaluated in Emergency Department on 05/15/2021 for the symptoms described in the history of present illness. He was evaluated in the context of the global COVID-19 pandemic, which necessitated consideration that the patient might be at risk for infection with the SARS-CoV-2 virus that causes COVID-19. Institutional protocols and algorithms that pertain to the evaluation of patients at risk for COVID-19 are in a state of rapid change based on information released by regulatory bodies including the CDC and federal and state organizations. These policies and algorithms were followed during the patient's care in the ED.  Final Clinical Impression(s) / ED Diagnoses Final diagnoses:  Sepsis without acute organ dysfunction, due to unspecified organism [facility])  Cellulitis of right lower extremity   Admit to medicine        Kindred Reidinger, MD 05/15/21 3785

## 2021-05-15 NOTE — Consult Note (Addendum)
Reason for Consult:Right lower leg cellulitis Referring Physician: Marcellus Scott Time called: 0740 Time at bedside: 0905   Mike Sellers is an 30 y.o. male.  HPI: Eleuterio comes to the ED with about a 3d hx/o right lower leg pain. It started out of the blue. He did have a cut there from 6 weeks ago but said that had been fine for a month before this started. He also admits to fevers, chills, and nausea. He is not currently working.  Past Medical History:  Diagnosis Date   Staph aureus infection     History reviewed. No pertinent surgical history.  Family History  Family history unknown: Yes    Social History:  reports current alcohol use. He reports current drug use. Drug: Marijuana. No history on file for tobacco use.  Allergies: No Known Allergies  Medications: I have reviewed the patient's current medications.  Results for orders placed or performed during the hospital encounter of 05/14/21 (from the past 48 hour(s))  Aerobic/Anaerobic Culture w Gram Stain (surgical/deep wound)     Status: None (Preliminary result)   Collection Time: 05/14/21  7:28 PM   Specimen: Wound  Result Value Ref Range   Specimen Description WOUND    Special Requests RL    Gram Stain      MODERATE WBC PRESENT, PREDOMINANTLY PMN MODERATE GRAM POSITIVE COCCI IN PAIRS IN CHAINS Performed at [facility], [address] [place], [place] [address]    Culture PENDING    Report Status PENDING   Basic metabolic panel     Status: None   Collection Time: 05/14/21  8:20 PM  Result Value Ref Range   Sodium 138 135 - 145 mmol/L   Potassium 4.1 3.5 - 5.1 mmol/L   Chloride 101 98 - 111 mmol/L   CO2 26 22 - 32 mmol/L   Glucose, Bld 82 70 - 99 mg/dL    Comment: Glucose reference range applies only to samples taken after fasting for at least 8 hours.   BUN 8 6 - 20 mg/dL   Creatinine, Ser 6.96 0.61 - 1.24 mg/dL   Calcium 9.3 8.9 - 78.9 mg/dL   GFR, Estimated >38 >10 mL/min    Comment:  (NOTE) Calculated using the CKD-EPI Creatinine Equation (2021)    Anion gap 11 5 - 15    Comment: Performed at [facility], [address] [place], [place] [address]  CBC with Differential     Status: Abnormal   Collection Time: 05/14/21  8:20 PM  Result Value Ref Range   WBC 20.4 (H) 4.0 - 10.5 K/uL   RBC 5.43 4.22 - 5.81 MIL/uL   Hemoglobin 15.5 13.0 - 17.0 g/dL   HCT 25.8 52.7 - 78.2 %   MCV 88.2 80.0 - 100.0 fL   MCH 28.5 26.0 - 34.0 pg   MCHC 32.4 30.0 - 36.0 g/dL   RDW 42.3 53.6 - 14.4 %   Platelets 287 150 - 400 K/uL   nRBC 0.0 0.0 - 0.2 %   Neutrophils Relative % 87 %   Neutro Abs 17.7 (H) 1.7 - 7.7 K/uL   Lymphocytes Relative 6 %   Lymphs Abs 1.2 0.7 - 4.0 K/uL   Monocytes Relative 5 %   Monocytes Absolute 1.1 (H) 0.1 - 1.0 K/uL   Eosinophils Relative 1 %   Eosinophils Absolute 0.2 0.0 - 0.5 K/uL   Basophils Relative 0 %   Basophils Absolute 0.0 0.0 - 0.1 K/uL   Immature  Granulocytes 1 %   Abs Immature Granulocytes 0.15 (H) 0.00 - 0.07 K/uL    Comment: Performed at [facility], [address] [place], [place] [address]  Lactic acid, plasma     Status: None   Collection Time: 05/14/21  8:20 PM  Result Value Ref Range   Lactic Acid, Venous 1.3 0.5 - 1.9 mmol/L    Comment: Performed at [facility], [address] [place], [place] [address]  Lactic acid, plasma     Status: Abnormal   Collection Time: 05/14/21 11:39 PM  Result Value Ref Range   Lactic Acid, Venous 2.2 (HH) 0.5 - 1.9 mmol/L    Comment: CRITICAL RESULT CALLED TO, READ BACK BY AND VERIFIED WITH: Lendell Caprice E,RN 05/15/21 0036 WAYK Performed at [facility], [address], [place], [place] [address]   Comprehensive metabolic panel     Status: None   Collection Time: 05/14/21 11:39 PM  Result Value Ref Range   Sodium 135 135 - 145 mmol/L   Potassium 3.5 3.5 - 5.1 mmol/L   Chloride 101 98 - 111 mmol/L   CO2 25 22 - 32 mmol/L   Glucose, Bld 87 70 - 99 mg/dL    Comment:  Glucose reference range applies only to samples taken after fasting for at least 8 hours.   BUN 10 6 - 20 mg/dL   Creatinine, Ser 4.40 0.61 - 1.24 mg/dL   Calcium 9.0 8.9 - 34.7 mg/dL   Total Protein 6.7 6.5 - 8.1 g/dL   Albumin 3.5 3.5 - 5.0 g/dL   AST 15 15 - 41 U/L   ALT 13 0 - 44 U/L   Alkaline Phosphatase 73 38 - 126 U/L   Total Bilirubin 0.6 0.3 - 1.2 mg/dL   GFR, Estimated >42 >59 mL/min    Comment: (NOTE) Calculated using the CKD-EPI Creatinine Equation (2021)    Anion gap 9 5 - 15    Comment: Performed at [facility], [address] [place], [place] [address]  Protime-INR     Status: None   Collection Time: 05/14/21 11:39 PM  Result Value Ref Range   Prothrombin Time 15.2 11.4 - 15.2 seconds   INR 1.2 0.8 - 1.2    Comment: (NOTE) INR goal varies based on device and disease states. Performed at [facility], [address], [place], [place] [address]   APTT     Status: None   Collection Time: 05/14/21 11:39 PM  Result Value Ref Range   aPTT 33 24 - 36 seconds    Comment: Performed at [facility], [address], [place], [place] [address]  Resp Panel by RT-PCR (Flu A&B, Covid) Nasopharyngeal Swab     Status: None   Collection Time: 05/14/21 11:55 PM   Specimen: Nasopharyngeal Swab; Nasopharyngeal(NP) swabs in vial transport medium  Result Value Ref Range   SARS Coronavirus 2 by RT PCR NEGATIVE NEGATIVE    Comment: (NOTE) SARS-CoV-2 target nucleic acids are NOT DETECTED.  The SARS-CoV-2 RNA is generally detectable in upper respiratory specimens during the acute phase of infection. The lowest concentration of SARS-CoV-2 viral copies this assay can detect is 138 copies/mL. A negative result does not preclude SARS-Cov-2 infection and should not be used as the sole basis for treatment or other patient management decisions. A negative result may occur with  improper specimen collection/handling, submission of specimen other than nasopharyngeal  swab, presence of viral mutation(s) within the  areas targeted by this assay, and inadequate number of viral copies(<138 copies/mL). A negative result must be combined with clinical observations, patient history, and epidemiological information. The expected result is Negative.  Fact Sheet for Patients:  BloggerCourse.com  Fact Sheet for Healthcare Providers:  SeriousBroker.it  This test is no t yet approved or cleared by the [place] FDA and  has been authorized for detection and/or diagnosis of SARS-CoV-2 by FDA under an Emergency Use Authorization (EUA). This EUA will remain  in effect (meaning this test can be used) for the duration of the COVID-19 declaration under Section 564(b)(1) of the Act, 21 U.S.C.section 360bbb-3(b)(1), unless the authorization is terminated  or revoked sooner.       Influenza A by PCR NEGATIVE NEGATIVE   Influenza B by PCR NEGATIVE NEGATIVE    Comment: (NOTE) The Xpert Xpress SARS-CoV-2/FLU/RSV plus assay is intended as an aid in the diagnosis of influenza from Nasopharyngeal swab specimens and should not be used as a sole basis for treatment. Nasal washings and aspirates are unacceptable for Xpert Xpress SARS-CoV-2/FLU/RSV testing.  Fact Sheet for Patients: BloggerCourse.com  Fact Sheet for Healthcare Providers: SeriousBroker.it  This test is not yet approved or cleared by the [place] FDA and has been authorized for detection and/or diagnosis of SARS-CoV-2 by FDA under an Emergency Use Authorization (EUA). This EUA will remain in effect (meaning this test can be used) for the duration of the COVID-19 declaration under Section 564(b)(1) of the Act, 21 U.S.C. section 360bbb-3(b)(1), unless the authorization is terminated or revoked.  Performed at [facility], [address] [place], [place] [address]   Urinalysis, Routine w  reflex microscopic Urine, Clean Catch     Status: Abnormal   Collection Time: 05/15/21  1:33 AM  Result Value Ref Range   Color, Urine AMBER (A) YELLOW    Comment: BIOCHEMICALS MAY BE AFFECTED BY COLOR   APPearance HAZY (A) CLEAR   Specific Gravity, Urine 1.039 (H) 1.005 - 1.030   pH 5.0 5.0 - 8.0   Glucose, UA NEGATIVE NEGATIVE mg/dL   Hgb urine dipstick NEGATIVE NEGATIVE   Bilirubin Urine NEGATIVE NEGATIVE   Ketones, ur 5 (A) NEGATIVE mg/dL   Protein, ur 30 (A) NEGATIVE mg/dL   Nitrite NEGATIVE NEGATIVE   Leukocytes,Ua NEGATIVE NEGATIVE   RBC / HPF 0-5 0 - 5 RBC/hpf   WBC, UA 0-5 0 - 5 WBC/hpf   Bacteria, UA NONE SEEN NONE SEEN   Mucus PRESENT     Comment: Performed at [facility], [address] [place], [place] [address]  Lactic acid, plasma     Status: None   Collection Time: 05/15/21  1:48 AM  Result Value Ref Range   Lactic Acid, Venous 1.5 0.5 - 1.9 mmol/L    Comment: Performed at [facility], [address] [place], [place] [address]  CBC     Status: Abnormal   Collection Time: 05/15/21  3:20 AM  Result Value Ref Range   WBC 19.9 (H) 4.0 - 10.5 K/uL   RBC 4.77 4.22 - 5.81 MIL/uL   Hemoglobin 13.8 13.0 - 17.0 g/dL   HCT 91.4 78.2 - 95.6 %   MCV 87.2 80.0 - 100.0 fL   MCH 28.9 26.0 - 34.0 pg   MCHC 33.2 30.0 - 36.0 g/dL   RDW 21.3 08.6 - 57.8 %   Platelets 249 150 - 400 K/uL   nRBC 0.0 0.0 - 0.2 %  Comment: Performed at [facility], [address] [place], [place] [address]  Basic metabolic panel     Status: Abnormal   Collection Time: 05/15/21  4:30 AM  Result Value Ref Range   Sodium 136 135 - 145 mmol/L   Potassium 3.3 (L) 3.5 - 5.1 mmol/L   Chloride 103 98 - 111 mmol/L   CO2 24 22 - 32 mmol/L   Glucose, Bld 118 (H) 70 - 99 mg/dL    Comment: Glucose reference range applies only to samples taken after fasting for at least 8 hours.   BUN 7 6 - 20 mg/dL   Creatinine, Ser 4.76 0.61 - 1.24 mg/dL   Calcium 8.5 (L) 8.9 - 10.3 mg/dL    GFR, Estimated >54 >65 mL/min    Comment: (NOTE) Calculated using the CKD-EPI Creatinine Equation (2021)    Anion gap 9 5 - 15    Comment: Performed at [facility], [address] [place], [place] [address]    DG Tibia/Fibula Right  Result Date: 05/14/2021 CLINICAL DATA:  Right leg wound.  Infection. EXAM: RIGHT TIBIA AND FIBULA - 2 VIEW COMPARISON:  05/02/2020 FINDINGS: Soft tissue swelling anteriorly. No radiopaque foreign bodies or soft tissue gas. No bone destruction or acute bony abnormality. IMPRESSION: Soft tissue swelling within the right calf. No acute bony abnormality. Electronically Signed   By: Charlett Nose M.D.   On: 05/14/2021 23:30   DG Chest Port 1 View  Result Date: 05/14/2021 CLINICAL DATA:  Questionable sepsis. EXAM: PORTABLE CHEST 1 VIEW COMPARISON:  October 23, 2008 FINDINGS: The heart size and mediastinal contours are within normal limits. Both lungs are clear. The visualized skeletal structures are unremarkable. IMPRESSION: No active disease. Electronically Signed   By: Aram Candela M.D.   On: 05/14/2021 23:36    Review of Systems  Constitutional:  Positive for chills and fever. Negative for diaphoresis.  HENT:  Negative for ear discharge, ear pain, hearing loss and tinnitus.   Eyes:  Negative for photophobia and pain.  Respiratory:  Negative for cough and shortness of breath.   Cardiovascular:  Negative for chest pain.  Gastrointestinal:  Positive for nausea. Negative for abdominal pain and vomiting.  Genitourinary:  Negative for dysuria, flank pain, frequency and urgency.  Musculoskeletal:  Positive for myalgias (Right lower leg). Negative for back pain and neck pain.  Neurological:  Negative for dizziness and headaches.  Hematological:  Does not bruise/bleed easily.  Psychiatric/Behavioral:  The patient is not nervous/anxious.   Blood pressure (!) 109/57, pulse 82, temperature 98.7 F (37.1 C), temperature source Oral, resp. rate 18, height 5\' 7"   (1.702 m), weight 86.2 kg, SpO2 99 %. Physical Exam Constitutional:      General: He is not in acute distress.    Appearance: He is well-developed. He is not diaphoretic.  HENT:     Head: Normocephalic and atraumatic.  Eyes:     General: No scleral icterus.       Right eye: No discharge.        Left eye: No discharge.     Conjunctiva/sclera: Conjunctivae normal.  Cardiovascular:     Rate and Rhythm: Normal rate and regular rhythm.  Pulmonary:     Effort: Pulmonary effort is normal. No respiratory distress.  Musculoskeletal:     Cervical back: Normal range of motion.     Comments: RLE No traumatic wounds, ecchymosis, or rash  Anterior lower leg wound with purulent discharge, exquisite TTP ant and lateral,  compartments soft, no pain with passive dorsiflexion.  No knee or ankle effusion  Knee stable to varus/ valgus and anterior/posterior stress  Sens DPN, SPN, TN intact  Motor EHL, ext, flex, evers 5/5  DP 2+, PT 0, 2+ NP edema  Skin:    General: Skin is warm and dry.  Neurological:     Mental Status: He is alert.  Psychiatric:        Mood and Affect: Mood normal.        Behavior: Behavior normal.    Assessment/Plan: Right lower leg cellulitis -- Will check MRI to r/o drainable fluid collection. Continue medical treatment for cellulitis.  Update: MRI shows small abscess but looks like it's draining through wound. Dr. Lajoyce Cornersuda to evaluate in AM to decide on operative I&D. NPO after MN.    Freeman CaldronMichael J. Tniya Bowditch, PA-C Orthopedic Surgery [phone] 05/15/2021, 9:13 AM

## 2021-05-15 NOTE — ED Notes (Signed)
Patient will go to MRI then to bed 5N22. MRI staff notified and will make transport arrangements. Nurse Delphia Grates on 5N notified.

## 2021-05-15 NOTE — ED Notes (Signed)
Nurse report given to Burna, RN

## 2021-05-15 NOTE — Progress Notes (Addendum)
PROGRESS NOTE   Mike Sellers  FOY:[id number]    DOB: Jul 05, 1991    DOA: 05/14/2021  PCP: Patient, No Pcp Per (Inactive)   I have briefly reviewed patients previous medical records in [facility].  Chief Complaint  Patient presents with   Wound Check    Brief Narrative:  30 year old male with no significant past medical history, presented to the ED due to worsening redness, pain and swelling of the right leg following an abrasion to the leg sustained approximately 1.5 months ago with broken wood log splinter.  The leg was reportedly okay until 3 days ago when symptoms abruptly started.  Also noted purulent drainage.  Admitted for sepsis without acute organ dysfunction, POA, due to right leg cellulitis with abscess.  Orthopedics consulted and plan I&D in OR 6/30.  Assessment & Plan:  Active Problems:   Cellulitis of right lower extremity   Sepsis without acute organ dysfunction, POA, due to right lower leg cellulitis and abscess: Met sepsis criteria on admission including fever, tachycardia, tachypnea, leukocytosis and source of infection.  Had a leg wound following injury with a broken wood splinter approximately 6 weeks ago with symptoms starting 3 days PTA.  MRI confirms right lower leg cellulitis with a 2.5 x 0.6 x 3 cm abscess and subcutaneous fatty tissues along the medial diaphysis of the tibia approximately 20 cm above the tibial plateau.  No myositis/fasciitis or osteomyelitis.  Orthopedics consulted and plan I&D in OR 6/30.  Patient confirms that he had a tetanus shot in 2018.  Venous Doppler negative for DVT.  Continue IVF and empirically started vancomycin.  Blood cultures x2: Pending.  Wound gram stain shows moderate gram-positive cocci in pairs and chains.  Polysubstance abuse (alcohol, tobacco, THC): To admitting MD he reported that he has cut down alcohol use significantly since 2019.  Ongoing tobacco use and not considering quitting at this time despite cessation  counseling.  Advised cessation of all substances.  Hypokalemia: Replaced.  Follow BMP.  Leukocytosis: Secondary to infectious etiology above.  Trend daily CBC.  Body mass index is 29.76 kg/m.    DVT prophylaxis: enoxaparin (LOVENOX) injection 40 mg Start: 05/15/21 1000     Code Status: Full Code Family Communication: None at bedside. Disposition:  Status is: Inpatient  Remains inpatient appropriate because:Inpatient level of care appropriate due to severity of illness  Dispo: The patient is from: Home              Anticipated d/c is to: Home              Patient currently is not medically stable to d/c.   Difficult to place patient No        Consultants:   Orthopedics  Procedures:   None  Antimicrobials:    Anti-infectives (From admission, onward)    Start     Dose/Rate Route Frequency Ordered Stop   05/15/21 1000  vancomycin (VANCOREADY) IVPB 1250 mg/250 mL        1,250 mg 166.7 mL/hr over 90 Minutes Intravenous Every 12 hours 05/15/21 0302     05/14/21 2330  vancomycin (VANCOREADY) IVPB 1500 mg/300 mL        1,500 mg 150 mL/hr over 120 Minutes Intravenous  Once 05/14/21 2309 05/15/21 0220   05/14/21 2315  piperacillin-tazobactam (ZOSYN) IVPB 3.375 g        3.375 g 100 mL/hr over 30 Minutes Intravenous  Once 05/14/21 2309 05/15/21 0054  Subjective:  Patient seen in the ED early this morning.  He reported ongoing right leg swelling, redness, drainage and pain.  Asking if he can eat despite advising him that he should not until evaluation by orthopedics in case they decide to take him to the OR.  Confirms tetanus shot in 2018.  Objective:   Vitals:   05/15/21 0630 05/15/21 0645 05/15/21 0924 05/15/21 1051  BP: 108/69 (!) 109/57 104/64 115/71  Pulse: 85 82 88 79  Resp: (!) 21 18 (!) 24 14  Temp:   98.6 F (37 C) 98.4 F (36.9 C)  TempSrc:    Oral  SpO2: 98% 99% 100% 100%  Weight:      Height:        General exam: Young male, moderately  built and nourished lying comfortably propped up in bed without distress. Respiratory system: Clear to auscultation. Respiratory effort normal. Cardiovascular system: S1 & S2 heard, RRR. No JVD, murmurs, rubs, gallops or clicks. No pedal edema.  Telemetry at bedside confirms sinus rhythm. Gastrointestinal system: Abdomen is nondistended, soft and nontender. No organomegaly or masses felt. Normal bowel sounds heard. Central nervous system: Alert and oriented. No focal neurological deficits. Extremities: Symmetric 5 x 5 power.  Right anterior lower leg with approximately 1 cm diameter superficial wound draining purulent material, not foul-smelling.  Diffuse swelling, erythema, exquisite tenderness to palpation but no crepitus or fluctuation.  No concern regarding compartment syndrome at this time.  Had good distal pulses and ankle/toe movements. Skin: As above Psychiatry: Judgement and insight appear normal. Mood & affect appropriate.     Data Reviewed:   I have personally reviewed following labs and imaging studies   CBC: Recent Labs  Lab 05/14/21 2020 05/15/21 0320  WBC 20.4* 19.9*  NEUTROABS 17.7*  --   HGB 15.5 13.8  HCT 47.9 41.6  MCV 88.2 87.2  PLT 287 024    Basic Metabolic Panel: Recent Labs  Lab 05/14/21 2020 05/14/21 2339 05/15/21 0430  NA 138 135 136  K 4.1 3.5 3.3*  CL 101 101 103  CO2 '26 25 24  ' GLUCOSE 82 87 118*  BUN '8 10 7  ' CREATININE 0.91 0.89 0.81  CALCIUM 9.3 9.0 8.5*    Liver Function Tests: Recent Labs  Lab 05/14/21 2339  AST 15  ALT 13  ALKPHOS 73  BILITOT 0.6  PROT 6.7  ALBUMIN 3.5    CBG: No results for input(s): GLUCAP in the last 168 hours.  Microbiology Studies:   Recent Results (from the past 240 hour(s))  Aerobic/Anaerobic Culture w Gram Stain (surgical/deep wound)     Status: None (Preliminary result)   Collection Time: 05/14/21  7:28 PM   Specimen: Wound  Result Value Ref Range Status   Specimen Description WOUND  Final    Special Requests RL  Final   Gram Stain   Final    MODERATE WBC PRESENT, PREDOMINANTLY PMN MODERATE GRAM POSITIVE COCCI IN PAIRS IN CHAINS    Culture   Final    TOO YOUNG TO READ Performed at [facility], [address], [place], [place] [address]    Report Status PENDING  Incomplete  Resp Panel by RT-PCR (Flu A&B, Covid) Nasopharyngeal Swab     Status: None   Collection Time: 05/14/21 11:55 PM   Specimen: Nasopharyngeal Swab; Nasopharyngeal(NP) swabs in vial transport medium  Result Value Ref Range Status   SARS Coronavirus 2 by RT PCR NEGATIVE NEGATIVE Final    Comment: (NOTE)  SARS-CoV-2 target nucleic acids are NOT DETECTED.  The SARS-CoV-2 RNA is generally detectable in upper respiratory specimens during the acute phase of infection. The lowest concentration of SARS-CoV-2 viral copies this assay can detect is 138 copies/mL. A negative result does not preclude SARS-Cov-2 infection and should not be used as the sole basis for treatment or other patient management decisions. A negative result may occur with  improper specimen collection/handling, submission of specimen other than nasopharyngeal swab, presence of viral mutation(s) within the areas targeted by this assay, and inadequate number of viral copies(<138 copies/mL). A negative result must be combined with clinical observations, patient history, and epidemiological information. The expected result is Negative.  Fact Sheet for Patients:  EntrepreneurPulse.com.au  Fact Sheet for Healthcare Providers:  IncredibleEmployment.be  This test is no t yet approved or cleared by the [place] FDA and  has been authorized for detection and/or diagnosis of SARS-CoV-2 by FDA under an Emergency Use Authorization (EUA). This EUA will remain  in effect (meaning this test can be used) for the duration of the COVID-19 declaration under Section 564(b)(1) of the Act, 21 U.S.C.section  360bbb-3(b)(1), unless the authorization is terminated  or revoked sooner.       Influenza A by PCR NEGATIVE NEGATIVE Final   Influenza B by PCR NEGATIVE NEGATIVE Final    Comment: (NOTE) The Xpert Xpress SARS-CoV-2/FLU/RSV plus assay is intended as an aid in the diagnosis of influenza from Nasopharyngeal swab specimens and should not be used as a sole basis for treatment. Nasal washings and aspirates are unacceptable for Xpert Xpress SARS-CoV-2/FLU/RSV testing.  Fact Sheet for Patients: EntrepreneurPulse.com.au  Fact Sheet for Healthcare Providers: IncredibleEmployment.be  This test is not yet approved or cleared by the [place] FDA and has been authorized for detection and/or diagnosis of SARS-CoV-2 by FDA under an Emergency Use Authorization (EUA). This EUA will remain in effect (meaning this test can be used) for the duration of the COVID-19 declaration under Section 564(b)(1) of the Act, 21 U.S.C. section 360bbb-3(b)(1), unless the authorization is terminated or revoked.  Performed at [facility], [place] [address] [place], [place] [address]      Radiology Studies:  DG Tibia/Fibula Right  Result Date: 05/14/2021 CLINICAL DATA:  Right leg wound.  Infection. EXAM: RIGHT TIBIA AND FIBULA - 2 VIEW COMPARISON:  05/02/2020 FINDINGS: Soft tissue swelling anteriorly. No radiopaque foreign bodies or soft tissue gas. No bone destruction or acute bony abnormality. IMPRESSION: Soft tissue swelling within the right calf. No acute bony abnormality. Electronically Signed   By: Rolm Baptise M.D.   On: 05/14/2021 23:30   MR TIBIA FIBULA RIGHT W WO CONTRAST  Result Date: 05/15/2021 CLINICAL DATA:  Acute onset right lower leg pain and swelling 3 days ago. Fever, chills and nausea. The patient reports a laceration of the right lower leg 6 weeks ago. EXAM: MRI OF LOWER RIGHT EXTREMITY WITHOUT AND WITH CONTRAST TECHNIQUE: Multiplanar, multisequence  MR imaging of the right lower leg was performed both before and after administration of intravenous contrast. CONTRAST:  9 mL GADAVIST IV SOLN COMPARISON:  None. FINDINGS: Bones/Joint/Cartilage No bone marrow edema or enhancement to suggest osteomyelitis is identified. No fracture, stress change or worrisome lesion. Ligaments Negative. Muscles and Tendons No muscular edema or enhancement is identified. No intramuscular fluid collection is seen. No evidence of gas within muscle or tracking along fascial planes. Soft tissues There is diffuse and intense subcutaneous edema and enhancement consistent with cellulitis. There is focal fluid  in subcutaneous fat measuring 2.5 cm AP x 0.6 cm transverse x 3 cm craniocaudal consistent with an abscess. This abscess is along the medial margin of the tibial diaphysis approximately 20 cm above the plafond. No other abscess is identified. IMPRESSION: Right lower leg cellulitis with a 2.5 x 0.6 x 3 cm abscess in subcutaneous fatty tissues along the medial diaphysis of the tibia approximately 20 cm above the tibial plafond. Negative for myositis, fasciitis or osteomyelitis. Electronically Signed   By: Inge Rise M.D.   On: 05/15/2021 10:43   DG Chest Port 1 View  Result Date: 05/14/2021 CLINICAL DATA:  Questionable sepsis. EXAM: PORTABLE CHEST 1 VIEW COMPARISON:  October 23, 2008 FINDINGS: The heart size and mediastinal contours are within normal limits. Both lungs are clear. The visualized skeletal structures are unremarkable. IMPRESSION: No active disease. Electronically Signed   By: Virgina Norfolk M.D.   On: 05/14/2021 23:36   VAS [place] LOWER EXTREMITY VENOUS (DVT)  Result Date: 05/15/2021  Lower Venous DVT Study Patient Name:  Mike Sellers  Date of Exam:   05/15/2021 Medical Rec #: [id number]          Accession #:    [id number] Date of Birth: 10-22-1991          Patient Gender: M Patient Age:   29Y Exam Location:  [facility] Procedure:      VAS [place] LOWER  EXTREMITY VENOUS (DVT) Referring Phys: [id number] TIMOTHY S OPYD --------------------------------------------------------------------------------  Indications: Edema.  Comparison Study: no prior Performing Technologist: Archie Patten RVS  Examination Guidelines: A complete evaluation includes B-mode imaging, spectral Doppler, color Doppler, and power Doppler as needed of all accessible portions of each vessel. Bilateral testing is considered an integral part of a complete examination. Limited examinations for reoccurring indications may be performed as noted. The reflux portion of the exam is performed with the patient in reverse Trendelenburg.  +---------+---------------+---------+-----------+----------+--------------+ RIGHT    CompressibilityPhasicitySpontaneityPropertiesThrombus Aging +---------+---------------+---------+-----------+----------+--------------+ CFV      Full           Yes      Yes                                 +---------+---------------+---------+-----------+----------+--------------+ SFJ      Full                                                        +---------+---------------+---------+-----------+----------+--------------+ FV Prox  Full                                                        +---------+---------------+---------+-----------+----------+--------------+ FV Mid   Full                                                        +---------+---------------+---------+-----------+----------+--------------+ FV DistalFull                                                        +---------+---------------+---------+-----------+----------+--------------+  PFV      Full                                                        +---------+---------------+---------+-----------+----------+--------------+ POP      Full           Yes      Yes                                 +---------+---------------+---------+-----------+----------+--------------+ PTV       Full                                                        +---------+---------------+---------+-----------+----------+--------------+ PERO     Full                                                        +---------+---------------+---------+-----------+----------+--------------+   +----+---------------+---------+-----------+----------+--------------+ LEFTCompressibilityPhasicitySpontaneityPropertiesThrombus Aging +----+---------------+---------+-----------+----------+--------------+ CFV Full           Yes      Yes                                 +----+---------------+---------+-----------+----------+--------------+     Summary: RIGHT: - There is no evidence of deep vein thrombosis in the lower extremity.  - No cystic structure found in the popliteal fossa.  LEFT: - No evidence of common femoral vein obstruction.  *See table(s) above for measurements and observations.    Preliminary      Scheduled Meds:    enoxaparin (LOVENOX) injection  40 mg Subcutaneous Daily    Continuous Infusions:    lactated ringers 125 mL/hr at 05/15/21 0316   vancomycin       LOS: 0 days     Vernell Leep, MD, [place], [facility]    To contact the attending provider between 7A-7P or the covering provider during after hours 7P-7A, please log into the web site [email or web address] and access using universal [place] password for that web site. If you do not have the password, please call the hospital operator.  05/15/2021, 11:56 AM

## 2021-05-15 NOTE — ED Notes (Signed)
Dr. Nicanor Alcon notified of lactic level

## 2021-05-15 NOTE — ED Notes (Signed)
Patient was given a breakfast tray and he ate the tray. Patient was told this morning not to eat but choose to anyway. Possible surgery today.

## 2021-05-15 NOTE — H&P (Signed)
History and Physical    Mike Sellers VVO:[id number] DOB: 10/31/1991 DOA: 05/14/2021  PCP: Patient, No Pcp Per (Inactive)   Patient coming from: Home   Chief Complaint: Right leg swelling, pain, and drainage   HPI: Mike Sellers is a 30 y.o. male who denies any significant past medical history now presents to the emergency department with worsening redness, pain, and swelling involving the lower right leg.  The patient reports that he suffered an abrasion to the anterior aspect of the lower right leg approximately 1 and half months ago and then noticed surrounding redness, swelling, and pain over the past few days.  There has been purulent drainage for 2 days and then 1 day of fever.  He denies lightheadedness, chest pain, shortness of breath, or cough.  Reports that he has cut back on his alcohol use significantly since 2019 and denies history of alcohol withdrawal.  He continues to smoke cigarettes and is not ready to think about cutting back or quitting at this time.  ED Course: Upon arrival to the ED, patient is found to be febrile to 38.1 C, slightly tachycardic, and with stable blood pressure.  WBC is 20,400.  Lactic acid 2.2.  Plain radiographs of the lower right leg are negative for acute bony abnormality or gas.  Blood cultures were collected and the patient was given IV fluids, vancomycin, and Zosyn in the ED.  Review of Systems:  All other systems reviewed and apart from HPI, are negative.  Past Medical History:  Diagnosis Date   Staph aureus infection     History reviewed. No pertinent surgical history.  Social History:   reports current alcohol use. He reports current drug use. Drug: Marijuana. No history on file for tobacco use.  No Known Allergies  Family History  Family history unknown: Yes     Prior to Admission medications   Medication Sig Start Date End Date Taking? Authorizing Provider  naproxen (NAPROSYN) 500 MG tablet Take 1 tablet (500 mg total) by  mouth 2 (two) times daily. Patient taking differently: Take 500 mg by mouth 2 (two) times daily as needed for mild pain, headache or moderate pain. 06/10/15  Yes Renne Crigler, PA-C    Physical Exam: Vitals:   05/15/21 0145 05/15/21 0215 05/15/21 0232 05/15/21 0245  BP: 122/65 114/64  103/75  Pulse: 95 95  95  Resp: (!) 23 (!) 23  (!) 21  Temp:   98.7 F (37.1 C)   TempSrc:   Oral   SpO2: 100% 100%  99%  Weight:      Height:        Constitutional: NAD, calm  Eyes: PERTLA, lids and conjunctivae normal ENMT: Mucous membranes are moist. Posterior pharynx clear of any exudate or lesions.   Neck: supple, no masses  Respiratory: no wheezing, no crackles. No accessory muscle use.  Cardiovascular: S1 & S2 heard, regular rate and rhythm. No extremity edema.   Abdomen: No distension, no tenderness, soft. Bowel sounds active.  Musculoskeletal: no clubbing / cyanosis. No joint deformity upper and lower extremities.   Skin: Erythema, edema, heat, and tenderness involving RLE from ankle to knee and centered around a defect at anterior mid-shin where there is purulent drainage. Warm, dry, well-perfused. Neurologic: CN 2-12 grossly intact. Sensation intact. Strength 5/5 in all 4 limbs.  Psychiatric: Alert and oriented to person, place, and situation. Calm and cooperative.    Labs and Imaging on Admission: I have personally reviewed following labs and imaging studies  CBC:  Recent Labs  Lab 05/14/21 2020  WBC 20.4*  NEUTROABS 17.7*  HGB 15.5  HCT 47.9  MCV 88.2  PLT 287   Basic Metabolic Panel: Recent Labs  Lab 05/14/21 2020 05/14/21 2339  NA 138 135  K 4.1 3.5  CL 101 101  CO2 26 25  GLUCOSE 82 87  BUN 8 10  CREATININE 0.91 0.89  CALCIUM 9.3 9.0   GFR: Estimated Creatinine Clearance: 129.5 mL/min (by C-G formula based on SCr of 0.89 mg/dL). Liver Function Tests: Recent Labs  Lab 05/14/21 2339  AST 15  ALT 13  ALKPHOS 73  BILITOT 0.6  PROT 6.7  ALBUMIN 3.5   No  results for input(s): LIPASE, AMYLASE in the last 168 hours. No results for input(s): AMMONIA in the last 168 hours. Coagulation Profile: Recent Labs  Lab 05/14/21 2339  INR 1.2   Cardiac Enzymes: No results for input(s): CKTOTAL, CKMB, CKMBINDEX, TROPONINI in the last 168 hours. BNP (last 3 results) No results for input(s): PROBNP in the last 8760 hours. HbA1C: No results for input(s): HGBA1C in the last 72 hours. CBG: No results for input(s): GLUCAP in the last 168 hours. Lipid Profile: No results for input(s): CHOL, HDL, LDLCALC, TRIG, CHOLHDL, LDLDIRECT in the last 72 hours. Thyroid Function Tests: No results for input(s): TSH, T4TOTAL, FREET4, T3FREE, THYROIDAB in the last 72 hours. Anemia Panel: No results for input(s): VITAMINB12, FOLATE, FERRITIN, TIBC, IRON, RETICCTPCT in the last 72 hours. Urine analysis:    Component Value Date/Time   COLORURINE AMBER (A) 05/15/2021 0133   APPEARANCEUR HAZY (A) 05/15/2021 0133   LABSPEC 1.039 (H) 05/15/2021 0133   PHURINE 5.0 05/15/2021 0133   GLUCOSEU NEGATIVE 05/15/2021 0133   HGBUR NEGATIVE 05/15/2021 0133   BILIRUBINUR NEGATIVE 05/15/2021 0133   KETONESUR 5 (A) 05/15/2021 0133   PROTEINUR 30 (A) 05/15/2021 0133   NITRITE NEGATIVE 05/15/2021 0133   LEUKOCYTESUR NEGATIVE 05/15/2021 0133   Sepsis Labs: @LABRCNTIP (procalcitonin:4,lacticidven:4) ) Recent Results (from the past 240 hour(s))  Resp Panel by RT-PCR (Flu A&B, Covid) Nasopharyngeal Swab     Status: None   Collection Time: 05/14/21 11:55 PM   Specimen: Nasopharyngeal Swab; Nasopharyngeal(NP) swabs in vial transport medium  Result Value Ref Range Status   SARS Coronavirus 2 by RT PCR NEGATIVE NEGATIVE Final    Comment: (NOTE) SARS-CoV-2 target nucleic acids are NOT DETECTED.  The SARS-CoV-2 RNA is generally detectable in upper respiratory specimens during the acute phase of infection. The lowest concentration of SARS-CoV-2 viral copies this assay can detect  is 138 copies/mL. A negative result does not preclude SARS-Cov-2 infection and should not be used as the sole basis for treatment or other patient management decisions. A negative result may occur with  improper specimen collection/handling, submission of specimen other than nasopharyngeal swab, presence of viral mutation(s) within the areas targeted by this assay, and inadequate number of viral copies(<138 copies/mL). A negative result must be combined with clinical observations, patient history, and epidemiological information. The expected result is Negative.  Fact Sheet for Patients:  05/16/21  Fact Sheet for Healthcare Providers:  BloggerCourse.com  This test is no t yet approved or cleared by the [place] FDA and  has been authorized for detection and/or diagnosis of SARS-CoV-2 by FDA under an Emergency Use Authorization (EUA). This EUA will remain  in effect (meaning this test can be used) for the duration of the COVID-19 declaration under Section 564(b)(1) of the Act, 21 U.S.C.section 360bbb-3(b)(1), unless the authorization is terminated  or revoked sooner.       Influenza A by PCR NEGATIVE NEGATIVE Final   Influenza B by PCR NEGATIVE NEGATIVE Final    Comment: (NOTE) The Xpert Xpress SARS-CoV-2/FLU/RSV plus assay is intended as an aid in the diagnosis of influenza from Nasopharyngeal swab specimens and should not be used as a sole basis for treatment. Nasal washings and aspirates are unacceptable for Xpert Xpress SARS-CoV-2/FLU/RSV testing.  Fact Sheet for Patients: BloggerCourse.com  Fact Sheet for Healthcare Providers: SeriousBroker.it  This test is not yet approved or cleared by the [place] FDA and has been authorized for detection and/or diagnosis of SARS-CoV-2 by FDA under an Emergency Use Authorization (EUA). This EUA will remain in effect  (meaning this test can be used) for the duration of the COVID-19 declaration under Section 564(b)(1) of the Act, 21 U.S.C. section 360bbb-3(b)(1), unless the authorization is terminated or revoked.  Performed at [facility], [address] [place], [place] [address]      Radiological Exams on Admission: DG Tibia/Fibula Right  Result Date: 05/14/2021 CLINICAL DATA:  Right leg wound.  Infection. EXAM: RIGHT TIBIA AND FIBULA - 2 VIEW COMPARISON:  05/02/2020 FINDINGS: Soft tissue swelling anteriorly. No radiopaque foreign bodies or soft tissue gas. No bone destruction or acute bony abnormality. IMPRESSION: Soft tissue swelling within the right calf. No acute bony abnormality. Electronically Signed   By: Charlett Nose M.D.   On: 05/14/2021 23:30   DG Chest Port 1 View  Result Date: 05/14/2021 CLINICAL DATA:  Questionable sepsis. EXAM: PORTABLE CHEST 1 VIEW COMPARISON:  October 23, 2008 FINDINGS: The heart size and mediastinal contours are within normal limits. Both lungs are clear. The visualized skeletal structures are unremarkable. IMPRESSION: No active disease. Electronically Signed   By: Aram Candela M.D.   On: 05/14/2021 23:36     Assessment/Plan  1. Cellulitis  - Presents with progressive erythema, edema, and pain involving lower right leg with purulent drainage and is found to be febrile with marked leukocytosis  - There is circumferential lower leg swelling but foot is warm with intact pulses   - Blood cultures were collected in ED and empiric antibiotics started  - Continue vancomycin, check venous US, consider advanced imaging and/or surgical consultation if worsens or fails to improve in next 24 hours     DVT prophylaxis: Lovenox   Code Status: full  Level of Care: Level of care: Med-Surg Family Communication: None present  Disposition Plan:  Patient is from: home  Anticipated d/c is to: home Anticipated d/c date is: 05/17/21  Patient currently: pending venous US,  improvement with abx or else will need advanced imaging and/or surgical consult   Consults called: none  Admission status: Inpatient     Briscoe Deutscher, MD [facility]  05/15/2021, 3:14 AM

## 2021-05-16 ENCOUNTER — Other Ambulatory Visit: Payer: Self-pay | Admitting: Physician Assistant

## 2021-05-16 ENCOUNTER — Encounter (HOSPITAL_COMMUNITY)
Admission: EM | Disposition: A | Discharge status: Home / Self Care | Payer: Self-pay | Source: Home / Self Care | Attending: Internal Medicine

## 2021-05-16 ENCOUNTER — Inpatient Hospital Stay (HOSPITAL_COMMUNITY): Payer: Self-pay | Admitting: Registered Nurse

## 2021-05-16 ENCOUNTER — Encounter (HOSPITAL_COMMUNITY): Payer: Self-pay | Admitting: Family Medicine

## 2021-05-16 DIAGNOSIS — A419 Sepsis, unspecified organism: Principal | ICD-10-CM

## 2021-05-16 DIAGNOSIS — L03115 Cellulitis of right lower limb: Secondary | ICD-10-CM

## 2021-05-16 DIAGNOSIS — L02415 Cutaneous abscess of right lower limb: Secondary | ICD-10-CM

## 2021-05-16 HISTORY — PX: I & D EXTREMITY: SHX5045

## 2021-05-16 LAB — URINE CULTURE: Culture: NO GROWTH

## 2021-05-16 LAB — BASIC METABOLIC PANEL
Anion gap: 6 (ref 5–15)
BUN: 7 mg/dL (ref 6–20)
CO2: 25 mmol/L (ref 22–32)
Calcium: 8.1 mg/dL — ABNORMAL LOW (ref 8.9–10.3)
Chloride: 106 mmol/L (ref 98–111)
Creatinine, Ser: 0.7 mg/dL (ref 0.61–1.24)
GFR, Estimated: >60 mL/min (ref >60)
Glucose, Bld: 95 mg/dL (ref 70–99)
Potassium: 4.2 mmol/L (ref 3.5–5.1)
Sodium: 137 mmol/L (ref 135–145)

## 2021-05-16 LAB — CBC
HCT: 53.9 % — ABNORMAL HIGH (ref 39.0–52.0)
Hemoglobin: 17.1 g/dL — ABNORMAL HIGH (ref 13.0–17.0)
MCH: 27.9 pg (ref 26.0–34.0)
MCHC: 31.7 g/dL (ref 30.0–36.0)
MCV: 87.8 fL (ref 80.0–100.0)
Platelets: 194 10*3/uL (ref 150–400)
RBC: 6.14 MIL/uL — ABNORMAL HIGH (ref 4.22–5.81)
RDW: 12.9 % (ref 11.5–15.5)
WBC: 12 10*3/uL — ABNORMAL HIGH (ref 4.0–10.5)
nRBC: 0 % (ref 0.0–0.2)

## 2021-05-16 LAB — SURGICAL PCR SCREEN
MRSA, PCR: NEGATIVE
Staphylococcus aureus: NEGATIVE

## 2021-05-16 SURGERY — IRRIGATION AND DEBRIDEMENT EXTREMITY
Anesthesia: General | Laterality: Right

## 2021-05-16 MED ORDER — CHLORHEXIDINE GLUCONATE 0.12 % MT SOLN
15.0000 mL | Freq: Once | OROMUCOSAL | Status: AC
  Administered 2021-05-16: 15 mL via OROMUCOSAL
  Filled 2021-05-16: qty 15

## 2021-05-16 MED ORDER — METOCLOPRAMIDE HCL 5 MG PO TABS: 5.0000 mg | ORAL_TABLET | Freq: Three times a day (TID) | ORAL | Status: DC | PRN

## 2021-05-16 MED ORDER — ONDANSETRON HCL 4 MG PO TABS
4.0000 mg | ORAL_TABLET | Freq: Four times a day (QID) | ORAL | Status: DC | PRN
Start: 2021-05-16 — End: 2021-05-20

## 2021-05-16 MED ORDER — SODIUM CHLORIDE 0.9 % IV SOLN
2.0000 g | Freq: Four times a day (QID) | INTRAVENOUS | Status: DC
  Administered 2021-05-16 – 2021-05-18 (×6): 2 g via INTRAVENOUS
  Filled 2021-05-16 (×3): qty 2000
  Filled 2021-05-16: qty 2
  Filled 2021-05-16 (×4): qty 2000
  Filled 2021-05-16: qty 2

## 2021-05-16 MED ORDER — DEXAMETHASONE SODIUM PHOSPHATE 10 MG/ML IJ SOLN
INTRAMUSCULAR | Status: DC | PRN
  Administered 2021-05-16: 5 mg via INTRAVENOUS

## 2021-05-16 MED ORDER — LIDOCAINE 2% (20 MG/ML) 5 ML SYRINGE
INTRAMUSCULAR | Status: DC | PRN
  Administered 2021-05-16: 60 mg via INTRAVENOUS

## 2021-05-16 MED ORDER — FENTANYL CITRATE (PF) 250 MCG/5ML IJ SOLN
INTRAMUSCULAR | Status: AC
  Filled 2021-05-16: qty 5

## 2021-05-16 MED ORDER — FENTANYL CITRATE (PF) 100 MCG/2ML IJ SOLN
INTRAMUSCULAR | Status: AC
  Filled 2021-05-16: qty 2

## 2021-05-16 MED ORDER — ORAL CARE MOUTH RINSE: 15.0000 mL | Freq: Once | OROMUCOSAL | Status: AC

## 2021-05-16 MED ORDER — ONDANSETRON HCL 4 MG/2ML IJ SOLN
INTRAMUSCULAR | Status: DC | PRN
  Administered 2021-05-16: 4 mg via INTRAVENOUS

## 2021-05-16 MED ORDER — CEFAZOLIN SODIUM-DEXTROSE 2-4 GM/100ML-% IV SOLN: 2.0000 g | Freq: Four times a day (QID) | INTRAVENOUS | Status: DC

## 2021-05-16 MED ORDER — CEFAZOLIN SODIUM-DEXTROSE 2-4 GM/100ML-% IV SOLN
2.0000 g | INTRAVENOUS | Status: AC
  Administered 2021-05-16: 2 g via INTRAVENOUS
  Filled 2021-05-16: qty 100

## 2021-05-16 MED ORDER — DOCUSATE SODIUM 100 MG PO CAPS
100.0000 mg | ORAL_CAPSULE | Freq: Two times a day (BID) | ORAL | Status: DC
  Administered 2021-05-16 – 2021-05-20 (×7): 100 mg via ORAL
  Filled 2021-05-16 (×8): qty 1

## 2021-05-16 MED ORDER — PROPOFOL 10 MG/ML IV BOLUS
INTRAVENOUS | Status: DC | PRN
  Administered 2021-05-16: 200 mg via INTRAVENOUS

## 2021-05-16 MED ORDER — LACTATED RINGERS IV SOLN: INTRAVENOUS | Status: DC

## 2021-05-16 MED ORDER — MIDAZOLAM HCL 2 MG/2ML IJ SOLN
INTRAMUSCULAR | Status: DC | PRN
  Administered 2021-05-16: 2 mg via INTRAVENOUS

## 2021-05-16 MED ORDER — PROPOFOL 10 MG/ML IV BOLUS
INTRAVENOUS | Status: AC
  Filled 2021-05-16: qty 20

## 2021-05-16 MED ORDER — 0.9 % SODIUM CHLORIDE (POUR BTL) OPTIME
TOPICAL | Status: DC | PRN
  Administered 2021-05-16: 1000 mL

## 2021-05-16 MED ORDER — ONDANSETRON HCL 4 MG/2ML IJ SOLN: 4.0000 mg | Freq: Four times a day (QID) | INTRAMUSCULAR | Status: DC | PRN

## 2021-05-16 MED ORDER — MIDAZOLAM HCL 2 MG/2ML IJ SOLN
INTRAMUSCULAR | Status: AC
  Filled 2021-05-16: qty 2

## 2021-05-16 MED ORDER — FENTANYL CITRATE (PF) 100 MCG/2ML IJ SOLN
25.0000 ug | INTRAMUSCULAR | Status: DC | PRN
  Administered 2021-05-16: 25 ug via INTRAVENOUS

## 2021-05-16 MED ORDER — METOCLOPRAMIDE HCL 5 MG/ML IJ SOLN
5.0000 mg | Freq: Three times a day (TID) | INTRAMUSCULAR | Status: DC | PRN
Start: 2021-05-16 — End: 2021-05-19

## 2021-05-16 SURGICAL SUPPLY — 34 items
BAG COUNTER SPONGE SURGICOUNT (BAG) IMPLANT
BAG SPNG CNTER NS LX DISP (BAG)
BLADE SURG 21 STRL SS (BLADE) ×2 IMPLANT
BNDG COHESIVE 6X5 TAN NS LF (GAUZE/BANDAGES/DRESSINGS) ×1 IMPLANT
BNDG COHESIVE 6X5 TAN STRL LF (GAUZE/BANDAGES/DRESSINGS) ×1 IMPLANT
BNDG GAUZE ELAST 4 BULKY (GAUZE/BANDAGES/DRESSINGS) ×3 IMPLANT
COVER SURGICAL LIGHT HANDLE (MISCELLANEOUS) ×4 IMPLANT
DRAIN PENROSE 0.25X18 (DRAIN) ×1 IMPLANT
DRAPE U-SHAPE 47X51 STRL (DRAPES) ×2 IMPLANT
DRSG ADAPTIC 3X8 NADH LF (GAUZE/BANDAGES/DRESSINGS) ×2 IMPLANT
DURAPREP 26ML APPLICATOR (WOUND CARE) ×2 IMPLANT
ELECT REM PT RETURN 9FT ADLT (ELECTROSURGICAL)
ELECTRODE REM PT RTRN 9FT ADLT (ELECTROSURGICAL) IMPLANT
GAUZE SPONGE 4X4 12PLY STRL (GAUZE/BANDAGES/DRESSINGS) ×2 IMPLANT
GLOVE SURG ORTHO LTX SZ9 (GLOVE) ×2 IMPLANT
GLOVE SURG UNDER POLY LF SZ9 (GLOVE) ×2 IMPLANT
GOWN STRL REUS W/ TWL XL LVL3 (GOWN DISPOSABLE) ×2 IMPLANT
GOWN STRL REUS W/TWL XL LVL3 (GOWN DISPOSABLE) ×4
HANDPIECE INTERPULSE COAX TIP (DISPOSABLE)
KIT BASIN OR (CUSTOM PROCEDURE TRAY) ×2 IMPLANT
KIT TURNOVER KIT B (KITS) ×2 IMPLANT
MANIFOLD NEPTUNE II (INSTRUMENTS) ×2 IMPLANT
NS IRRIG 1000ML POUR BTL (IV SOLUTION) ×2 IMPLANT
PACK ORTHO EXTREMITY (CUSTOM PROCEDURE TRAY) ×2 IMPLANT
PAD ARMBOARD 7.5X6 YLW CONV (MISCELLANEOUS) ×4 IMPLANT
SET HNDPC FAN SPRY TIP SCT (DISPOSABLE) IMPLANT
SPONGE T-LAP 18X18 ~~LOC~~+RFID (SPONGE) ×2 IMPLANT
STOCKINETTE IMPERVIOUS 9X36 MD (GAUZE/BANDAGES/DRESSINGS) ×1 IMPLANT
SUT ETHILON 2 0 PSLX (SUTURE) ×2 IMPLANT
SWAB COLLECTION DEVICE MRSA (MISCELLANEOUS) ×1 IMPLANT
SWAB CULTURE ESWAB REG 1ML (MISCELLANEOUS) IMPLANT
TOWEL GREEN STERILE (TOWEL DISPOSABLE) ×2 IMPLANT
TUBE CONNECTING 12X1/4 (SUCTIONS) ×2 IMPLANT
YANKAUER SUCT BULB TIP NO VENT (SUCTIONS) ×2 IMPLANT

## 2021-05-16 NOTE — Anesthesia Preprocedure Evaluation (Addendum)
Anesthesia Evaluation  Patient identified by MRN, date of birth, ID band Patient awake    Reviewed: Allergy & Precautions, NPO status , Patient's Chart, lab work & pertinent test results  Airway Mallampati: III  TM Distance: >3 FB Neck ROM: Full    Dental  (+) Dental Advisory Given, Chipped, Missing,    Pulmonary Current SmokerPatient did not abstain from smoking.,    Pulmonary exam normal breath sounds clear to auscultation       Cardiovascular negative cardio ROS Normal cardiovascular exam Rhythm:Regular Rate:Normal     Neuro/Psych negative neurological ROS  negative psych ROS   GI/Hepatic negative GI ROS, (+)     substance abuse  alcohol use and marijuana use,   Endo/Other  negative endocrine ROS  Renal/GU negative Renal ROS  negative genitourinary   Musculoskeletal negative musculoskeletal ROS (+)   Abdominal   Peds  Hematology negative hematology ROS (+)   Anesthesia Other Findings presents to the emergency department with worsening redness, pain, and swelling involving the lower right leg.  The patient reports that he suffered an abrasion to the anterior aspect of the lower right leg approximately 1 and half months ago and then noticed surrounding redness, swelling, and pain over the past few days  Reproductive/Obstetrics                            Anesthesia Physical Anesthesia Plan  ASA: 2  Anesthesia Plan: General   Post-op Pain Management:    Induction: Intravenous  PONV Risk Score and Plan: 2 and Ondansetron, Dexamethasone and Midazolam  Airway Management Planned: LMA  Additional Equipment:   Intra-op Plan:   Post-operative Plan: Extubation in OR  Informed Consent: I have reviewed the patients History and Physical, chart, labs and discussed the procedure including the risks, benefits and alternatives for the proposed anesthesia with the patient or authorized  representative who has indicated his/her understanding and acceptance.     Dental advisory given  Plan Discussed with: CRNA  Anesthesia Plan Comments:         Anesthesia Quick Evaluation

## 2021-05-16 NOTE — Anesthesia Postprocedure Evaluation (Signed)
Anesthesia Post Note  Patient: Mike Sellers  Procedure(s) Performed: IRRIGATION AND DEBRIDEMENT LEG (Right)     Patient location during evaluation: PACU Anesthesia Type: General Level of consciousness: awake and alert Pain management: pain level controlled Vital Signs Assessment: post-procedure vital signs reviewed and stable Respiratory status: spontaneous breathing, nonlabored ventilation, respiratory function stable and patient connected to nasal cannula oxygen Cardiovascular status: blood pressure returned to baseline and stable Postop Assessment: no apparent nausea or vomiting Anesthetic complications: no   No notable events documented.  Last Vitals:  Vitals:   05/16/21 1235 05/16/21 1251  BP: 117/65 110/69  Pulse: 72 83  Resp: 19 17  Temp: 36.7 C 36.7 C  SpO2: 97% 97%    Last Pain:  Vitals:   05/16/21 1256  TempSrc:   PainSc: 10-Worst pain ever                 Doris Gruhn L Matison Nuccio

## 2021-05-16 NOTE — Progress Notes (Signed)
Pt requesting a regular diet instead of a carb modified diet. Dr. Lajoyce Corners made aware. "OK" per Dr. Lajoyce Corners to change order to regular diet. Order changed.

## 2021-05-16 NOTE — Plan of Care (Signed)

## 2021-05-16 NOTE — Consult Note (Signed)
ORTHOPAEDIC CONSULTATION  REQUESTING PHYSICIAN: Elease Etienne, MD  Chief Complaint: Purulent abscess draining right leg.  HPI: Mike Sellers is a 30 y.o. male who presents with a purulent abscess right leg patient complains of pain and purulent drainage.  Past Medical History:  Diagnosis Date   Staph aureus infection    History reviewed. No pertinent surgical history. Social History   Socioeconomic History   Marital status: Single    Spouse name: Not on file   Number of children: Not on file   Years of education: Not on file   Highest education level: Not on file  Occupational History   Not on file  Tobacco Use   Smoking status: Never   Smokeless tobacco: Never  Substance and Sexual Activity   Alcohol use: Yes   Drug use: Yes    Types: Marijuana   Sexual activity: Not on file  Other Topics Concern   Not on file  Social History Narrative   Not on file   Social Determinants of Health   Financial Resource Strain: Not on file  Food Insecurity: Not on file  Transportation Needs: Not on file  Physical Activity: Not on file  Stress: Not on file  Social Connections: Not on file   Family History  Family history unknown: Yes   - negative except otherwise stated in the family history section No Known Allergies Prior to Admission medications   Medication Sig Start Date End Date Taking? Authorizing Provider  naproxen (NAPROSYN) 500 MG tablet Take 1 tablet (500 mg total) by mouth 2 (two) times daily. Patient taking differently: Take 500 mg by mouth 2 (two) times daily as needed for mild pain, headache or moderate pain. 06/10/15  Yes Renne Crigler, PA-C   DG Tibia/Fibula Right  Result Date: 05/14/2021 CLINICAL DATA:  Right leg wound.  Infection. EXAM: RIGHT TIBIA AND FIBULA - 2 VIEW COMPARISON:  05/02/2020 FINDINGS: Soft tissue swelling anteriorly. No radiopaque foreign bodies or soft tissue gas. No bone destruction or acute bony abnormality. IMPRESSION: Soft  tissue swelling within the right calf. No acute bony abnormality. Electronically Signed   By: Charlett Nose M.D.   On: 05/14/2021 23:30   MR TIBIA FIBULA RIGHT W WO CONTRAST  Result Date: 05/15/2021 CLINICAL DATA:  Acute onset right lower leg pain and swelling 3 days ago. Fever, chills and nausea. The patient reports a laceration of the right lower leg 6 weeks ago. EXAM: MRI OF LOWER RIGHT EXTREMITY WITHOUT AND WITH CONTRAST TECHNIQUE: Multiplanar, multisequence MR imaging of the right lower leg was performed both before and after administration of intravenous contrast. CONTRAST:  9 mL GADAVIST IV SOLN COMPARISON:  None. FINDINGS: Bones/Joint/Cartilage No bone marrow edema or enhancement to suggest osteomyelitis is identified. No fracture, stress change or worrisome lesion. Ligaments Negative. Muscles and Tendons No muscular edema or enhancement is identified. No intramuscular fluid collection is seen. No evidence of gas within muscle or tracking along fascial planes. Soft tissues There is diffuse and intense subcutaneous edema and enhancement consistent with cellulitis. There is focal fluid in subcutaneous fat measuring 2.5 cm AP x 0.6 cm transverse x 3 cm craniocaudal consistent with an abscess. This abscess is along the medial margin of the tibial diaphysis approximately 20 cm above the plafond. No other abscess is identified. IMPRESSION: Right lower leg cellulitis with a 2.5 x 0.6 x 3 cm abscess in subcutaneous fatty tissues along the medial diaphysis of the tibia approximately 20 cm above the tibial plafond. Negative  for myositis, fasciitis or osteomyelitis. Electronically Signed   By: Drusilla Kannerhomas  Dalessio M.D.   On: 05/15/2021 10:43   DG Chest Port 1 View  Result Date: 05/14/2021 CLINICAL DATA:  Questionable sepsis. EXAM: PORTABLE CHEST 1 VIEW COMPARISON:  October 23, 2008 FINDINGS: The heart size and mediastinal contours are within normal limits. Both lungs are clear. The visualized skeletal structures are  unremarkable. IMPRESSION: No active disease. Electronically Signed   By: Aram Candelahaddeus  Houston M.D.   On: 05/14/2021 23:36   VAS US LOWER EXTREMITY VENOUS (DVT)  Result Date: 05/15/2021  Lower Venous DVT Study Patient Name:  Mike Sellers  Date of Exam:   05/15/2021 Medical Rec #: [id number]          Accession #:    [id number] Date of Birth: Apr 11, 1991          Patient Gender: M Patient Age:   29Y [facility] Procedure:      VAS US LOWER EXTREMITY VENOUS (DVT) Referring Phys: [id number] TIMOTHY S OPYD --------------------------------------------------------------------------------  Indications: Edema.  Comparison Study: no prior Performing Technologist: [place] PonderMegan Stricklin RVS  Examination Guidelines: A complete evaluation includes B-mode imaging, spectral Doppler, color Doppler, and power Doppler as needed of all accessible portions of each vessel. Bilateral testing is considered an integral part of a complete examination. Limited examinations for reoccurring indications may be performed as noted. The reflux portion of the exam is performed with the patient in reverse Trendelenburg.  +---------+---------------+---------+-----------+----------+--------------+ RIGHT    CompressibilityPhasicitySpontaneityPropertiesThrombus Aging +---------+---------------+---------+-----------+----------+--------------+ CFV      Full           Yes      Yes                                 +---------+---------------+---------+-----------+----------+--------------+ SFJ      Full                                                        +---------+---------------+---------+-----------+----------+--------------+ FV Prox  Full                                                        +---------+---------------+---------+-----------+----------+--------------+ FV Mid   Full                                                         +---------+---------------+---------+-----------+----------+--------------+ FV DistalFull                                                        +---------+---------------+---------+-----------+----------+--------------+ PFV      Full                                                        +---------+---------------+---------+-----------+----------+--------------+  POP      Full           Yes      Yes                                 +---------+---------------+---------+-----------+----------+--------------+ PTV      Full                                                        +---------+---------------+---------+-----------+----------+--------------+ PERO     Full                                                        +---------+---------------+---------+-----------+----------+--------------+   +----+---------------+---------+-----------+----------+--------------+ LEFTCompressibilityPhasicitySpontaneityPropertiesThrombus Aging +----+---------------+---------+-----------+----------+--------------+ CFV Full           Yes      Yes                                 +----+---------------+---------+-----------+----------+--------------+     Summary: RIGHT: - There is no evidence of deep vein thrombosis in the lower extremity.  - No cystic structure found in the popliteal fossa.  LEFT: - No evidence of common femoral vein obstruction.  *See table(s) above for measurements and observations. Electronically signed by Lemar Livings MD on 05/15/2021 at 6:29:49 PM.    Final    - pertinent xrays, CT, MRI studies were reviewed and independently interpreted  Positive ROS: All other systems have been reviewed and were otherwise negative with the exception of those mentioned in the HPI and as above.  Physical Exam: General: Alert, no acute distress Psychiatric: Patient is competent for consent with normal mood and affect Lymphatic: No axillary or cervical  lymphadenopathy Cardiovascular: No pedal edema Respiratory: No cyanosis, no use of accessory musculature GI: No organomegaly, abdomen is soft and non-tender    Images:  @ENCIMAGES @  Labs:  Lab Results  Component Value Date   REPTSTATUS PENDING 05/14/2021   GRAMSTAIN  05/14/2021    MODERATE WBC PRESENT, PREDOMINANTLY PMN MODERATE GRAM POSITIVE COCCI IN PAIRS IN CHAINS    CULT  05/14/2021    FEW GROUP A STREP (S.PYOGENES) ISOLATED Beta hemolytic streptococci are predictably susceptible to penicillin and other beta lactams. Susceptibility testing not routinely performed. Performed at [facility], [address] [place], [place]     Lab Results  Component Value Date   ALBUMIN 3.5 05/14/2021     CBC EXTENDED Latest Ref Rng & Units 05/16/2021 05/15/2021 05/14/2021  WBC 4.0 - 10.5 K/uL 12.0(H) 19.9(H) 20.4(H)  RBC 4.22 - 5.81 MIL/uL 6.14(H) 4.77 5.43  HGB 13.0 - 17.0 g/dL 17.1(H) 13.8 15.5  HCT 39.0 - 52.0 % 53.9(H) 41.6 47.9  PLT 150 - 400 K/uL 194 249 287  NEUTROABS 1.7 - 7.7 K/uL - - 17.7(H)  LYMPHSABS 0.7 - 4.0 K/uL - - 1.2    Neurologic: Patient does not have protective sensation bilateral lower extremities.   MUSCULOSKELETAL:   Skin: Examination patient has cellulitis and tenderness to palpation of the right leg there is no crepitation  there is no blistering of the skin there is a purulent draining ulcer on the mid tibia.  Review of the MRI scan shows a subcutaneous abscess this does not involve the bone does not involve the muscle.  Assessment: Assessment subcutaneous abscess right leg.  Plan: Plan: Will plan for debridement of the abscess risk and benefits were discussed including repeat surgery.  Patient states he understands wished to proceed at this time.  Thank you for the consult and the opportunity to see Mr. Mike Cona, MD [facility] [phone] 7:30 AM

## 2021-05-16 NOTE — Op Note (Addendum)
05/16/2021  12:01 PM  PATIENT:  Mike Sellers    PRE-OPERATIVE DIAGNOSIS:  ABscess Right leg  POST-OPERATIVE DIAGNOSIS:  Same  PROCEDURE: Excisional DEBRIDEMENT ABSCESS RIGHT LEG Local tissue rearrangement for wound closure 9 x 5 cm.   SURGEON:  Nadara Mustard, MD  PHYSICIAN ASSISTANT:None ANESTHESIA:   General  PREOPERATIVE INDICATIONS:  Mike Sellers is a  30 y.o. male with a diagnosis of ABscess Right leg who failed conservative measures and elected for surgical management.    The risks benefits and alternatives were discussed with the patient preoperatively including but not limited to the risks of infection, bleeding, nerve injury, cardiopulmonary complications, the need for revision surgery, among others, and the patient was willing to proceed.  OPERATIVE IMPLANTS: Penrose drain 1/4 inch  @ENCIMAGES @  OPERATIVE FINDINGS: Tissue margins were clear and the abscess tissue was sent for cultures.  OPERATIVE PROCEDURE: Patient was brought the operating room and underwent a general anesthetic.  After adequate levels anesthesia were obtained patient's right lower extremity was prepped using DuraPrep draped into a sterile field a timeout was called.  Patient had a purulent draining abscess from the anterior medial aspect of the right leg.  A elliptical incision was performed around the abscess tissue through healthy margins this left a wound that was 9 x 5 cm.  The abscess tissue was sharply excised.  The wound was irrigated with normal saline electrocardio was used for hemostasis.  Local tissue rearrangement was used to close the wound 9 x 5 cm.  2-0 nylon was used for wound closure.  A Penrose drain was placed deep within the wound.  Sterile dressing was applied patient was extubated taken the PACU in stable condition.  Debridement type: Excisional Debridement  Side: right  Body Location: leg   Tools used for debridement: scalpel and rongeur  Pre-debridement Wound size  (cm):   Length: 1        Width: 1     Depth: 1   Post-debridement Wound size (cm):   Length: 9        Width: 5     Depth: 2   Debridement depth beyond dead/damaged tissue down to healthy viable tissue: yes  Tissue layer involved: skin, subcutaneous tissue, muscle / fascia  Nature of tissue removed: Slough, Necrotic, Devitalized Tissue, Non-viable tissue, and Purulence  Irrigation volume: 1 liter     Irrigation fluid type: Normal Saline     DISCHARGE PLANNING:  Antibiotic duration: Continue antibiotics adjust according to tissue cultures  Weightbearing: Weightbearing as tolerated  Pain medication: Opioid pathway  Dressing care/ Wound VAC: Reinforce or change dressing as needed with dry dressing  Ambulatory devices: Walker or crutches  Discharge to: Anticipate discharge to home.  Follow-up: In the office 1 week post operative.

## 2021-05-16 NOTE — Transfer of Care (Signed)
Immediate Anesthesia Transfer of Care Note  Patient: Inigo Lantigua  Procedure(s) Performed: IRRIGATION AND DEBRIDEMENT LEG (Right)  Patient Location: PACU  Anesthesia Type:General  Level of Consciousness: awake and drowsy  Airway & Oxygen Therapy: Patient Spontanous Breathing and Patient connected to nasal cannula oxygen  Post-op Assessment: Report given to RN and Post -op Vital signs reviewed and stable  Post vital signs: Reviewed and stable  Last Vitals:  Vitals Value Taken Time  BP 108/58 05/16/21 1205  Temp 36.4 C 05/16/21 1205  Pulse 93 05/16/21 1206  Resp 18 05/16/21 1206  SpO2 97 % 05/16/21 1206  Vitals shown include unvalidated device data.  Last Pain:  Vitals:   05/16/21 1045  TempSrc: Oral  PainSc: 6       Patients Stated Pain Goal: 2 (05/16/21 1045)  Complications: No notable events documented.

## 2021-05-16 NOTE — Anesthesia Procedure Notes (Signed)
Procedure Name: LMA Insertion Date/Time: 05/16/2021 11:25 AM Performed by: Lelon Perla, CRNA Pre-anesthesia Checklist: Patient identified, Emergency Drugs available, Suction available and Patient being monitored Patient Re-evaluated:Patient Re-evaluated prior to induction Oxygen Delivery Method: Circle System Utilized Preoxygenation: Pre-oxygenation with 100% oxygen Induction Type: IV induction Ventilation: Mask ventilation without difficulty LMA: LMA inserted LMA Size: 4.0 Number of attempts: 1 Airway Equipment and Method: Bite block Placement Confirmation: positive ETCO2 Tube secured with: Tape Dental Injury: Teeth and Oropharynx as per pre-operative assessment

## 2021-05-16 NOTE — Progress Notes (Signed)
PROGRESS NOTE   Mike Sellers  YBW:[id number]    DOB: 11/10/1991    DOA: 05/14/2021  PCP: Patient, No Pcp Per (Inactive)   I have briefly reviewed patients previous medical records in [facility].  Chief Complaint  Patient presents with   Wound Check    Brief Narrative:  31 year old male with no significant past medical history, presented to the ED due to worsening redness, pain and swelling of the right leg following an abrasion to the leg sustained approximately 1.5 months ago with broken wood log splinter.  The leg was reportedly okay until 3 days ago when symptoms abruptly started.  Also noted purulent drainage.  Admitted for sepsis without acute organ dysfunction, POA, due to right leg cellulitis with abscess.  Orthopedics consulted and s/p excisional debridement of abscess of right leg and local tissue rearrangement for wound closure 7/1.  Assessment & Plan:  Active Problems:   Cellulitis of right lower extremity   Abscess of right lower leg   Sepsis without acute organ dysfunction (HCC)   Sepsis without acute organ dysfunction, POA, due to right lower leg cellulitis and abscess: Met sepsis criteria on admission including fever, tachycardia, tachypnea, leukocytosis and source of infection.  Had a leg wound following injury with a broken wood splinter approximately 6 weeks ago with symptoms starting 3 days PTA.  MRI confirms right lower leg cellulitis with a 2.5 x 0.6 x 3 cm abscess and subcutaneous fatty tissues along the medial diaphysis of the tibia approximately 20 cm above the tibial plateau.  No myositis/fasciitis or osteomyelitis.  Orthopedics consulted and s/p excisional debridement of abscess of right leg and local tissue rearrangement for wound closure 7/1.  Patient confirms that he had a tetanus shot in 2018.  Venous Doppler negative for DVT.  Continue IVF and empirically started vancomycin.  Blood cultures x2: NTD.  Wound gram stain (preop): Few group A strep  isolated.  Surgical cultures pending.  Vancomycin switched to IV ampicillin.  Polysubstance abuse (alcohol, tobacco, THC): To admitting MD he reported that he has cut down alcohol use significantly since 2019.  Ongoing tobacco use and not considering quitting at this time despite cessation counseling.  Advised cessation of all substances.  Hypokalemia: Replaced.    Leukocytosis: Secondary to infectious etiology above.  Improved.  Body mass index is 29.76 kg/m.    DVT prophylaxis: SCDs Start: 05/16/21 1340 enoxaparin (LOVENOX) injection 40 mg Start: 05/15/21 1000     Code Status: Full Code Family Communication: None at bedside. Disposition:  Status is: Inpatient  Remains inpatient appropriate because:Inpatient level of care appropriate due to severity of illness  Dispo: The patient is from: Home              Anticipated d/c is to: Home              Patient currently is not medically stable to d/c.   Difficult to place patient No        Consultants:   Orthopedics  Procedures:   s/p excisional debridement of abscess of right leg and local tissue rearrangement for wound closure 7/1.  Antimicrobials:    Anti-infectives (From admission, onward)    Start     Dose/Rate Route Frequency Ordered Stop   05/16/21 2100  ampicillin (OMNIPEN) 2 g in sodium chloride 0.9 % 100 mL IVPB        2 g 300 mL/hr over 20 Minutes Intravenous Every 6 hours 05/16/21 1324     05/16/21 1430  ceFAZolin (ANCEF) IVPB 2g/100 mL premix  Status:  Discontinued        2 g 200 mL/hr over 30 Minutes Intravenous Every 6 hours 05/16/21 1339 05/16/21 1341   05/16/21 1045  ceFAZolin (ANCEF) IVPB 2g/100 mL premix        2 g 200 mL/hr over 30 Minutes Intravenous On call to O.R. 05/16/21 1034 05/16/21 1134   05/15/21 1000  vancomycin (VANCOREADY) IVPB 1250 mg/250 mL  Status:  Discontinued        1,250 mg 166.7 mL/hr over 90 Minutes Intravenous Every 12 hours 05/15/21 0302 05/16/21 1325   05/14/21 2330   vancomycin (VANCOREADY) IVPB 1500 mg/300 mL        1,500 mg 150 mL/hr over 120 Minutes Intravenous  Once 05/14/21 2309 05/15/21 0220   05/14/21 2315  piperacillin-tazobactam (ZOSYN) IVPB 3.375 g        3.375 g 100 mL/hr over 30 Minutes Intravenous  Once 05/14/21 2309 05/15/21 0054         Subjective:  Patient seen this morning prior to procedure.  Still with right leg pain.  Anxious to get over with procedure and wants something to eat and drink.  Objective:   Vitals:   05/16/21 1235 05/16/21 1251 05/16/21 1456 05/16/21 1458  BP: 117/65 110/69 129/76   Pulse: 72 83 91   Resp: _0 Temp: 98.1 F (36.7 C) 98 F (36.7 C) 97.8 F (36.6 C) 97.8 F (36.6 C)  TempSrc:  Oral Oral Oral  SpO2: 97% 97% 100%   Weight:      Height:        General exam: Young male, moderately built and nourished lying comfortably propped up in bed without distress. Respiratory system: Clear to auscultation. Respiratory effort normal. Cardiovascular system: S1 & S2 heard, RRR. No JVD, murmurs, rubs, gallops or clicks. No pedal edema.  Telemetry at bedside confirms sinus rhythm. Gastrointestinal system: Abdomen is nondistended, soft and nontender. No organomegaly or masses felt. Normal bowel sounds heard. Central nervous system: Alert and oriented. No focal neurological deficits. Extremities: Symmetric 5 x 5 power.  Right anterior lower leg with approximately 1 cm diameter superficial wound draining purulent material, not foul-smelling.  Mildly reduced diffuse swelling, erythema, and tenderness to palpation but no crepitus or fluctuation.  No concern regarding compartment syndrome at this time.  Had good distal pulses and ankle/toe movements. Skin: As above Psychiatry: Judgement and insight appear normal. Mood & affect appropriate.     Data Reviewed:   I have personally reviewed following labs and imaging studies   CBC: Recent Labs  Lab 05/14/21 2020 05/15/21 0320 05/16/21 0527  WBC 20.4*  19.9* 12.0*  NEUTROABS 17.7*  --   --   HGB 15.5 13.8 17.1*  HCT 47.9 41.6 53.9*  MCV 88.2 87.2 87.8  PLT 287 249 427    Basic Metabolic Panel: Recent Labs  Lab 05/14/21 2339 05/15/21 0430 05/16/21 0527  NA 135 136 137  K 3.5 3.3* 4.2  CL 101 103 106  CO2 _1 GLUCOSE 87 118* 95  BUN _2 CREATININE 0.89 0.81 0.70  CALCIUM 9.0 8.5* 8.1*    Liver Function Tests: Recent Labs  Lab 05/14/21 2339  AST 15  ALT 13  ALKPHOS 73  BILITOT 0.6  PROT 6.7  ALBUMIN 3.5    CBG: No results for input(s): GLUCAP in the last 168 hours.  Microbiology Studies:   Recent Results (from the past  240 hour(s))  Aerobic/Anaerobic Culture w Gram Stain (surgical/deep wound)     Status: None (Preliminary result)   Collection Time: 05/14/21  7:28 PM   Specimen: Wound  Result Value Ref Range Status   Specimen Description WOUND  Final   Special Requests RL  Final   Gram Stain   Final    MODERATE WBC PRESENT, PREDOMINANTLY PMN MODERATE GRAM POSITIVE COCCI IN PAIRS IN CHAINS Performed at [facility], [address], [place], [place] [address]    Culture   Final    FEW GROUP A STREP (S.PYOGENES) ISOLATED Beta hemolytic streptococci are predictably susceptible to penicillin and other beta lactams. Susceptibility testing not routinely performed. CULTURE REINCUBATED FOR BETTER GROWTH NO ANAEROBES ISOLATED; CULTURE IN PROGRESS FOR 5 DAYS    Report Status PENDING  Incomplete  Blood culture (routine x 2)     Status: None (Preliminary result)   Collection Time: 05/14/21 11:15 PM   Specimen: BLOOD  Result Value Ref Range Status   Specimen Description BLOOD RIGHT ANTECUBITAL  Final   Special Requests   Final    BOTTLES DRAWN AEROBIC AND ANAEROBIC Blood Culture adequate volume   Culture   Final    NO GROWTH 1 DAY Performed at [facility], [place] [address], [place], [place] [address]    Report Status PENDING  Incomplete  Blood culture (routine x 2)     Status: None  (Preliminary result)   Collection Time: 05/14/21 11:22 PM   Specimen: BLOOD RIGHT HAND  Result Value Ref Range Status   Specimen Description BLOOD RIGHT HAND  Final   Special Requests   Final    BOTTLES DRAWN AEROBIC AND ANAEROBIC Blood Culture results may not be optimal due to an inadequate volume of blood received in culture bottles   Culture   Final    NO GROWTH 1 DAY Performed at [facility], [place] [address] [place], [place] [address]    Report Status PENDING  Incomplete  Resp Panel by RT-PCR (Flu A&B, Covid) Nasopharyngeal Swab     Status: None   Collection Time: 05/14/21 11:55 PM   Specimen: Nasopharyngeal Swab; Nasopharyngeal(NP) swabs in vial transport medium  Result Value Ref Range Status   SARS Coronavirus 2 by RT PCR NEGATIVE NEGATIVE Final    Comment: (NOTE) SARS-CoV-2 target nucleic acids are NOT DETECTED.  The SARS-CoV-2 RNA is generally detectable in upper respiratory specimens during the acute phase of infection. The lowest concentration of SARS-CoV-2 viral copies this assay can detect is 138 copies/mL. A negative result does not preclude SARS-Cov-2 infection and should not be used as the sole basis for treatment or other patient management decisions. A negative result may occur with  improper specimen collection/handling, submission of specimen other than nasopharyngeal swab, presence of viral mutation(s) within the areas targeted by this assay, and inadequate number of viral copies(<138 copies/mL). A negative result must be combined with clinical observations, patient history, and epidemiological information. The expected result is Negative.  Fact Sheet for Patients:  EntrepreneurPulse.com.au  Fact Sheet for Healthcare Providers:  IncredibleEmployment.be  This test is no t yet approved or cleared by the [place] FDA and  has been authorized for detection and/or diagnosis of SARS-CoV-2 by FDA under an Emergency  Use Authorization (EUA). This EUA will remain  in effect (meaning this test can be used) for the duration of the COVID-19 declaration under Section 564(b)(1) of the Act, 21 U.S.C.section 360bbb-3(b)(1), unless the authorization is terminated  or revoked sooner.       Influenza A by PCR NEGATIVE NEGATIVE Final   Influenza B by PCR NEGATIVE NEGATIVE Final    Comment: (NOTE) The Xpert Xpress SARS-CoV-2/FLU/RSV plus assay is intended as an aid in the diagnosis of influenza from Nasopharyngeal swab specimens and should not be used as a sole basis for treatment. Nasal washings and aspirates are unacceptable for Xpert Xpress SARS-CoV-2/FLU/RSV testing.  Fact Sheet for Patients: EntrepreneurPulse.com.au  Fact Sheet for Healthcare Providers: IncredibleEmployment.be  This test is not yet approved or cleared by the [place] FDA and has been authorized for detection and/or diagnosis of SARS-CoV-2 by FDA under an Emergency Use Authorization (EUA). This EUA will remain in effect (meaning this test can be used) for the duration of the COVID-19 declaration under Section 564(b)(1) of the Act, 21 U.S.C. section 360bbb-3(b)(1), unless the authorization is terminated or revoked.  Performed at [facility], [place] [address] [place], [place] [address]   Urine culture     Status: None   Collection Time: 05/15/21  1:28 AM   Specimen: In/Out Cath Urine  Result Value Ref Range Status   Specimen Description IN/OUT CATH URINE  Final   Special Requests NONE  Final   Culture   Final    NO GROWTH Performed at [facility], [place] [address] [place], [place] [address]    Report Status 05/16/2021 FINAL  Final  Surgical pcr screen     Status: None   Collection Time: 05/16/21  8:49 AM   Specimen: Nasal Mucosa; Nasal Swab  Result Value Ref Range Status   MRSA, PCR NEGATIVE NEGATIVE Final   Staphylococcus aureus NEGATIVE NEGATIVE Final    Comment:  (NOTE) The Xpert SA Assay (FDA approved for NASAL specimens in patients 31 years of age and older), is one component of a comprehensive surveillance program. It is not intended to diagnose infection nor to guide or monitor treatment. Performed at [facility], [place] [address] [place], [place] [address]      Radiology Studies:  DG Tibia/Fibula Right  Result Date: 05/14/2021 CLINICAL DATA:  Right leg wound.  Infection. EXAM: RIGHT TIBIA AND FIBULA - 2 VIEW COMPARISON:  05/02/2020 FINDINGS: Soft tissue swelling anteriorly. No radiopaque foreign bodies or soft tissue gas. No bone destruction or acute bony abnormality. IMPRESSION: Soft tissue swelling within the right calf. No acute bony abnormality. Electronically Signed   By: Rolm Baptise M.D.   On: 05/14/2021 23:30   MR TIBIA FIBULA RIGHT W WO CONTRAST  Result Date: 05/15/2021 CLINICAL DATA:  Acute onset right lower leg pain and swelling 3 days ago. Fever, chills and nausea. The patient reports a laceration of the right lower leg 6 weeks ago. EXAM: MRI OF LOWER RIGHT EXTREMITY WITHOUT AND WITH CONTRAST TECHNIQUE: Multiplanar, multisequence MR imaging of the right lower leg was performed both before and after administration of intravenous contrast. CONTRAST:  9 mL GADAVIST IV SOLN COMPARISON:  None. FINDINGS: Bones/Joint/Cartilage No bone marrow edema or enhancement to suggest osteomyelitis is identified. No fracture, stress change or worrisome lesion. Ligaments Negative. Muscles and Tendons No muscular edema or enhancement is identified. No intramuscular fluid collection is seen. No evidence of gas within muscle or tracking along fascial planes. Soft tissues There is diffuse and intense subcutaneous edema and enhancement consistent with cellulitis. There is focal fluid in subcutaneous fat measuring 2.5 cm AP x 0.6 cm transverse x 3 cm craniocaudal consistent with an abscess. This abscess  is along the medial margin of the tibial diaphysis  approximately 20 cm above the plafond. No other abscess is identified. IMPRESSION: Right lower leg cellulitis with a 2.5 x 0.6 x 3 cm abscess in subcutaneous fatty tissues along the medial diaphysis of the tibia approximately 20 cm above the tibial plafond. Negative for myositis, fasciitis or osteomyelitis. Electronically Signed   By: Inge Rise M.D.   On: 05/15/2021 10:43   DG Chest Port 1 View  Result Date: 05/14/2021 CLINICAL DATA:  Questionable sepsis. EXAM: PORTABLE CHEST 1 VIEW COMPARISON:  October 23, 2008 FINDINGS: The heart size and mediastinal contours are within normal limits. Both lungs are clear. The visualized skeletal structures are unremarkable. IMPRESSION: No active disease. Electronically Signed   By: Virgina Norfolk M.D.   On: 05/14/2021 23:36   VAS [place] LOWER EXTREMITY VENOUS (DVT)  Result Date: 05/15/2021  Lower Venous DVT Study Patient Name:  Shadrach Couse  Date of Exam:   05/15/2021 Medical Rec #: [id number]          Accession #:    [id number] Date of Birth: December 05, 1990          Patient Gender: M Patient Age:   29Y Exam Location[facility]  [facility] Procedure:      VAS [place] LOWER EXTREMITY VENOUS (DVT) Referring Phys: [id number] TIMOTHY S OPYD --------------------------------------------------------------------------------  Indications: Edema.  Comparison Study: no prior Performing Technologist: Archie Patten RVS  Examination Guidelines: A complete evaluation includes B-mode imaging, spectral Doppler, color Doppler, and power Doppler as needed of all accessible portions of each vessel. Bilateral testing is considered an integral part of a complete examination. Limited examinations for reoccurring indications may be performed as noted. The reflux portion of the exam is performed with the patient in reverse Trendelenburg.  +---------+---------------+---------+-----------+----------+--------------+ RIGHT    CompressibilityPhasicitySpontaneityPropertiesThrombus Aging  +---------+---------------+---------+-----------+----------+--------------+ CFV      Full           Yes      Yes                                 +---------+---------------+---------+-----------+----------+--------------+ SFJ      Full                                                        +---------+---------------+---------+-----------+----------+--------------+ FV Prox  Full                                                        +---------+---------------+---------+-----------+----------+--------------+ FV Mid   Full                                                        +---------+---------------+---------+-----------+----------+--------------+ FV DistalFull                                                        +---------+---------------+---------+-----------+----------+--------------+  PFV      Full                                                        +---------+---------------+---------+-----------+----------+--------------+ POP      Full           Yes      Yes                                 +---------+---------------+---------+-----------+----------+--------------+ PTV      Full                                                        +---------+---------------+---------+-----------+----------+--------------+ PERO     Full                                                        +---------+---------------+---------+-----------+----------+--------------+   +----+---------------+---------+-----------+----------+--------------+ LEFTCompressibilityPhasicitySpontaneityPropertiesThrombus Aging +----+---------------+---------+-----------+----------+--------------+ CFV Full           Yes      Yes                                 +----+---------------+---------+-----------+----------+--------------+     Summary: RIGHT: - There is no evidence of deep vein thrombosis in the lower extremity.  - No cystic structure found in the popliteal fossa.   LEFT: - No evidence of common femoral vein obstruction.  *See table(s) above for measurements and observations. Electronically signed by Servando Snare MD on 05/15/2021 at 6:29:49 PM.    Final      Scheduled Meds:    docusate sodium  100 mg Oral BID   enoxaparin (LOVENOX) injection  40 mg Subcutaneous Daily   fentaNYL        Continuous Infusions:    ampicillin (OMNIPEN) IV       LOS: 1 day     Vernell Leep, MD, [place], [facility]. [facility]    To contact the attending provider between 7A-7P or the covering provider during after hours 7P-7A, please log into the web site [email or web address] and access using universal [place] password for that web site. If you do not have the password, please call the hospital operator.  05/16/2021, 6:18 PM

## 2021-05-17 ENCOUNTER — Encounter (HOSPITAL_COMMUNITY): Payer: Self-pay | Admitting: Orthopedic Surgery

## 2021-05-17 LAB — BASIC METABOLIC PANEL
Anion gap: 6 (ref 5–15)
BUN: 5 mg/dL — ABNORMAL LOW (ref 6–20)
CO2: 27 mmol/L (ref 22–32)
Calcium: 8.5 mg/dL — ABNORMAL LOW (ref 8.9–10.3)
Chloride: 103 mmol/L (ref 98–111)
Creatinine, Ser: 0.72 mg/dL (ref 0.61–1.24)
GFR, Estimated: >60 mL/min (ref >60)
Glucose, Bld: 129 mg/dL — ABNORMAL HIGH (ref 70–99)
Potassium: 4.2 mmol/L (ref 3.5–5.1)
Sodium: 136 mmol/L (ref 135–145)

## 2021-05-17 LAB — CBC
HCT: 36.7 % — ABNORMAL LOW (ref 39.0–52.0)
Hemoglobin: 12.5 g/dL — ABNORMAL LOW (ref 13.0–17.0)
MCH: 29.1 pg (ref 26.0–34.0)
MCHC: 34.1 g/dL (ref 30.0–36.0)
MCV: 85.3 fL (ref 80.0–100.0)
Platelets: 307 10*3/uL (ref 150–400)
RBC: 4.3 MIL/uL (ref 4.22–5.81)
RDW: 12.3 % (ref 11.5–15.5)
WBC: 19 10*3/uL — ABNORMAL HIGH (ref 4.0–10.5)
nRBC: 0 % (ref 0.0–0.2)

## 2021-05-17 NOTE — Progress Notes (Signed)
Patient ID: Mike Sellers, male   DOB: 1990-12-10, 30 y.o.   MRN: [id number] The patient is awake and alert this morning.  He is postop day 1 status post an irrigation and debridement of the leg abscess by my partner Dr. Lajoyce Corners.  According to the notes, the incision was closed and there is a dressing over this.  Also according to the operative report, there is a Penrose drain.  The dressing is clean and dry today.  His foot is swollen and some of this is to be expected given dependent edema combined with the wrap on his leg.  Cultures are pending.  He can weight-bear as tolerated.  We will change the dressing tomorrow and reassess the wound.

## 2021-05-17 NOTE — Plan of Care (Signed)
  Problem: Clinical Measurements: Goal: Will remain free from infection Outcome: Progressing   Problem: Activity: Goal: Risk for activity intolerance will decrease Outcome: Progressing   
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
## 2021-05-17 NOTE — Plan of Care (Signed)
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
## 2021-05-17 NOTE — Progress Notes (Signed)
PROGRESS NOTE   Mike Sellers  QPY:[id number]    DOB: 01/27/1991    DOA: 05/14/2021  PCP: Patient, No Pcp Per (Inactive)   I have briefly reviewed patients previous medical records in [facility]  Chief Complaint  Patient presents with   Wound Check    Brief Narrative:  30 year old male with no significant past medical history, presented to the ED due to worsening redness, pain and swelling of the right leg following an abrasion to the leg sustained approximately 1.5 months ago with broken wood log splinter.  The leg was reportedly okay until 3 days ago when symptoms abruptly started.  Also noted purulent drainage.  Admitted for sepsis without acute organ dysfunction, POA, due to right leg cellulitis with abscess.  Orthopedics consulted and s/p excisional debridement of abscess of right leg and local tissue rearrangement for wound closure 7/1.  Assessment & Plan:  Active Problems:   Cellulitis of right lower extremity   Abscess of right lower leg   Sepsis without acute organ dysfunction (HCC)   Sepsis without acute organ dysfunction, POA, due to right lower leg cellulitis and abscess: Met sepsis criteria on admission including fever, tachycardia, tachypnea, leukocytosis and source of infection.  Had a leg wound following injury with a broken wood splinter approximately 6 weeks ago with symptoms starting 3 days PTA.  MRI confirms right lower leg cellulitis with a 2.5 x 0.6 x 3 cm abscess and subcutaneous fatty tissues along the medial diaphysis of the tibia approximately 20 cm above the tibial plateau.  No myositis/fasciitis or osteomyelitis.  Orthopedics consulted and s/p excisional debridement of abscess of right leg and local tissue rearrangement for wound closure 7/1.  Patient confirms that he had a tetanus shot in 2018.  Venous Doppler negative for DVT.  Blood cultures x2: NTD.  Wound gram stain (preop): Few group A strep isolated.  Surgical cultures pending.  Vancomycin switched  to IV ampicillin.  Improving.  Discussed with Dr. Trenda Moots change 7/3, possible removal of Penrose drain, await surgical cultures for antibiotics adjustment.  Polysubstance abuse (alcohol, tobacco, THC): To admitting MD he reported that he has cut down alcohol use significantly since 2019.  Ongoing tobacco use and not considering quitting at this time despite cessation counseling.  Advised cessation of all substances.  Per RN, asking if he can go out to smoke.  Hypokalemia: Replaced.    Leukocytosis: Secondary to infectious etiology above.  WBC back up to 19.  Follow CBC tomorrow.  Body mass index is 29.76 kg/m.    DVT prophylaxis: SCDs Start: 05/16/21 1340 enoxaparin (LOVENOX) injection 40 mg Start: 05/15/21 1000     Code Status: Full Code Family Communication: None at bedside. Disposition:  Status is: Inpatient  Remains inpatient appropriate because:Inpatient level of care appropriate due to severity of illness  Dispo: The patient is from: Home              Anticipated d/c is to: Home              Patient currently is not medically stable to d/c.   Difficult to place patient No        Consultants:   Orthopedics  Procedures:   s/p excisional debridement of abscess of right leg and local tissue rearrangement for wound closure 7/1.  Antimicrobials:    Anti-infectives (From admission, onward)    Start     Dose/Rate Route Frequency Ordered Stop   05/16/21 2100  ampicillin (OMNIPEN) 2 g in sodium  chloride 0.9 % 100 mL IVPB        2 g 300 mL/hr over 20 Minutes Intravenous Every 6 hours 05/16/21 1324     05/16/21 1430  ceFAZolin (ANCEF) IVPB 2g/100 mL premix  Status:  Discontinued        2 g 200 mL/hr over 30 Minutes Intravenous Every 6 hours 05/16/21 1339 05/16/21 1341   05/16/21 1045  ceFAZolin (ANCEF) IVPB 2g/100 mL premix        2 g 200 mL/hr over 30 Minutes Intravenous On call to O.R. 05/16/21 1034 05/16/21 1134   05/15/21 1000  vancomycin (VANCOREADY) IVPB  1250 mg/250 mL  Status:  Discontinued        1,250 mg 166.7 mL/hr over 90 Minutes Intravenous Every 12 hours 05/15/21 0302 05/16/21 1325   05/14/21 2330  vancomycin (VANCOREADY) IVPB 1500 mg/300 mL        1,500 mg 150 mL/hr over 120 Minutes Intravenous  Once 05/14/21 2309 05/15/21 0220   05/14/21 2315  piperacillin-tazobactam (ZOSYN) IVPB 3.375 g        3.375 g 100 mL/hr over 30 Minutes Intravenous  Once 05/14/21 2309 05/15/21 0054         Subjective:  Right lower extremity pain is somewhat less.  Concerned about swelling above and below dressing.  Objective:   Vitals:   05/16/21 1456 05/16/21 1458 05/16/21 2220 05/17/21 0738  BP: 129/76  126/76 (!) 112/53  Pulse: 91  89 66  Resp: _0 Temp: 97.8 F (36.6 C) 97.8 F (36.6 C) 98.4 F (36.9 C) 97.9 F (36.6 C)  TempSrc: Oral Oral Oral Oral  SpO2: 100%  100% 100%  Weight:      Height:        General exam: Young male, moderately built and nourished lying comfortably propped up in bed without distress. Respiratory system: Clear to auscultation. Respiratory effort normal. Cardiovascular system: S1 & S2 heard, RRR. No JVD, murmurs, rubs, gallops or clicks. No pedal edema.   Gastrointestinal system: Abdomen is nondistended, soft and nontender. No organomegaly or masses felt. Normal bowel sounds heard. Central nervous system: Alert and oriented. No focal neurological deficits. Extremities: Symmetric 5 x 5 power.  Postop dressing of right leg clean and dry.  Some swelling proximal to the dressing just below the right knee and of the ankle.  Good distal pulses.  Normal color and movement of ankle and toes. Skin: As above Psychiatry: Judgement and insight appear normal. Mood & affect appropriate.     Data Reviewed:   I have personally reviewed following labs and imaging studies   CBC: Recent Labs  Lab 05/14/21 2020 05/15/21 0320 05/16/21 0527 05/17/21 0920  WBC 20.4* 19.9* 12.0* 19.0*  NEUTROABS 17.7*  --   --   --    HGB 15.5 13.8 17.1* 12.5*  HCT 47.9 41.6 53.9* 36.7*  MCV 88.2 87.2 87.8 85.3  PLT 287 249 194 381    Basic Metabolic Panel: Recent Labs  Lab 05/15/21 0430 05/16/21 0527 05/17/21 0920  NA 136 137 136  K 3.3* 4.2 4.2  CL 103 106 103  CO2 _1 GLUCOSE 118* 95 129*  BUN 7 7 5*  CREATININE 0.81 0.70 0.72  CALCIUM 8.5* 8.1* 8.5*    Liver Function Tests: Recent Labs  Lab 05/14/21 2339  AST 15  ALT 13  ALKPHOS 73  BILITOT 0.6  PROT 6.7  ALBUMIN 3.5    CBG: No results for input(s):  GLUCAP in the last 168 hours.  Microbiology Studies:   Recent Results (from the past 240 hour(s))  Aerobic/Anaerobic Culture w Gram Stain (surgical/deep wound)     Status: None (Preliminary result)   Collection Time: 05/14/21  7:28 PM   Specimen: Wound  Result Value Ref Range Status   Specimen Description WOUND  Final   Special Requests RL  Final   Gram Stain   Final    MODERATE WBC PRESENT, PREDOMINANTLY PMN MODERATE GRAM POSITIVE COCCI IN PAIRS IN CHAINS Performed at [facility], [address] [place], [place] [address]    Culture   Final    FEW GROUP A STREP (S.PYOGENES) ISOLATED Beta hemolytic streptococci are predictably susceptible to penicillin and other beta lactams. Susceptibility testing not routinely performed. CULTURE REINCUBATED FOR BETTER GROWTH NO ANAEROBES ISOLATED; CULTURE IN PROGRESS FOR 5 DAYS    Report Status PENDING  Incomplete  Blood culture (routine x 2)     Status: None (Preliminary result)   Collection Time: 05/14/21 11:15 PM   Specimen: BLOOD  Result Value Ref Range Status   Specimen Description BLOOD RIGHT ANTECUBITAL  Final   Special Requests   Final    BOTTLES DRAWN AEROBIC AND ANAEROBIC Blood Culture adequate volume   Culture   Final    NO GROWTH 2 DAYS Performed at [facility], [place] [address] [place], [place] [address]    Report Status PENDING  Incomplete  Blood culture (routine x 2)     Status: None (Preliminary result)    Collection Time: 05/14/21 11:22 PM   Specimen: BLOOD RIGHT HAND  Result Value Ref Range Status   Specimen Description BLOOD RIGHT HAND  Final   Special Requests   Final    BOTTLES DRAWN AEROBIC AND ANAEROBIC Blood Culture results may not be optimal due to an inadequate volume of blood received in culture bottles   Culture   Final    NO GROWTH 2 DAYS Performed at [facility], [place] [address] [place],  [address]    Report Status PENDING  Incomplete  Resp Panel by RT-PCR (Flu A&B, Covid) Nasopharyngeal Swab     Status: None   Collection Time: 05/14/21 11:55 PM   Specimen: Nasopharyngeal Swab; Nasopharyngeal(NP) swabs in vial transport medium  Result Value Ref Range Status   SARS Coronavirus 2 by RT PCR NEGATIVE NEGATIVE Final    Comment: (NOTE) SARS-CoV-2 target nucleic acids are NOT DETECTED.  The SARS-CoV-2 RNA is generally detectable in upper respiratory specimens during the acute phase of infection. The lowest concentration of SARS-CoV-2 viral copies this assay can detect is 138 copies/mL. A negative result does not preclude SARS-Cov-2 infection and should not be used as the sole basis for treatment or other patient management decisions. A negative result may occur with  improper specimen collection/handling, submission of specimen other than nasopharyngeal swab, presence of viral mutation(s) within the areas targeted by this assay, and inadequate number of viral copies(<138 copies/mL). A negative result must be combined with clinical observations, patient history, and epidemiological information. The expected result is Negative.  Fact Sheet for Patients:  EntrepreneurPulse.com.au  Fact Sheet for Healthcare Providers:  IncredibleEmployment.be  This test is no t yet approved or cleared by the [place] FDA and  has been authorized for detection and/or diagnosis of SARS-CoV-2 by FDA under an Emergency Use Authorization  (EUA). This EUA will remain  in effect (meaning this test can be used) for the duration of the  COVID-19 declaration under Section 564(b)(1) of the Act, 21 U.S.C.section 360bbb-3(b)(1), unless the authorization is terminated  or revoked sooner.       Influenza A by PCR NEGATIVE NEGATIVE Final   Influenza B by PCR NEGATIVE NEGATIVE Final    Comment: (NOTE) The Xpert Xpress SARS-CoV-2/FLU/RSV plus assay is intended as an aid in the diagnosis of influenza from Nasopharyngeal swab specimens and should not be used as a sole basis for treatment. Nasal washings and aspirates are unacceptable for Xpert Xpress SARS-CoV-2/FLU/RSV testing.  Fact Sheet for Patients: EntrepreneurPulse.com.au  Fact Sheet for Healthcare Providers: IncredibleEmployment.be  This test is not yet approved or cleared by the [place] FDA and has been authorized for detection and/or diagnosis of SARS-CoV-2 by FDA under an Emergency Use Authorization (EUA). This EUA will remain in effect (meaning this test can be used) for the duration of the COVID-19 declaration under Section 564(b)(1) of the Act, 21 U.S.C. section 360bbb-3(b)(1), unless the authorization is terminated or revoked.  Performed at [facility], [place] [address], [place], [place] [address]   Urine culture     Status: None   Collection Time: 05/15/21  1:28 AM   Specimen: In/Out Cath Urine  Result Value Ref Range Status   Specimen Description IN/OUT CATH URINE  Final   Special Requests NONE  Final   Culture   Final    NO GROWTH Performed at [facility], [place] [address], [place], [place] [address]    Report Status 05/16/2021 FINAL  Final  Surgical pcr screen     Status: None   Collection Time: 05/16/21  8:49 AM   Specimen: Nasal Mucosa; Nasal Swab  Result Value Ref Range Status   MRSA, PCR NEGATIVE NEGATIVE Final   Staphylococcus aureus NEGATIVE NEGATIVE Final    Comment: (NOTE) The Xpert SA  Assay (FDA approved for NASAL specimens in patients 67 years of age and older), is one component of a comprehensive surveillance program. It is not intended to diagnose infection nor to guide or monitor treatment. Performed at [facility], [place] [address] [place], [place] [address]      Radiology Studies:  No results found.   Scheduled Meds:    docusate sodium  100 mg Oral BID   enoxaparin (LOVENOX) injection  40 mg Subcutaneous Daily    Continuous Infusions:    ampicillin (OMNIPEN) IV 2 g (05/17/21 0709)     LOS: 2 days     Vernell Leep, MD, [place], [facility]. [facility]    To contact the attending provider between 7A-7P or the covering provider during after hours 7P-7A, please log into the web site [email or web address] and access using universal [place] password for that web site. If you do not have the password, please call the hospital operator.  05/17/2021, 12:38 PM

## 2021-05-18 LAB — CBC
HCT: 39.2 % (ref 39.0–52.0)
Hemoglobin: 12.8 g/dL — ABNORMAL LOW (ref 13.0–17.0)
MCH: 28.6 pg (ref 26.0–34.0)
MCHC: 32.7 g/dL (ref 30.0–36.0)
MCV: 87.5 fL (ref 80.0–100.0)
Platelets: 332 10*3/uL (ref 150–400)
RBC: 4.48 MIL/uL (ref 4.22–5.81)
RDW: 12.5 % (ref 11.5–15.5)
WBC: 16.1 10*3/uL — ABNORMAL HIGH (ref 4.0–10.5)
nRBC: 0 % (ref 0.0–0.2)

## 2021-05-18 MED ORDER — CEFAZOLIN SODIUM-DEXTROSE 1-4 GM/50ML-% IV SOLN
1.0000 g | Freq: Three times a day (TID) | INTRAVENOUS | Status: DC
  Administered 2021-05-18 – 2021-05-20 (×7): 1 g via INTRAVENOUS
  Filled 2021-05-18 (×7): qty 50

## 2021-05-18 NOTE — Progress Notes (Addendum)
PROGRESS NOTE   Mike Sellers  MRN:[id number]    DOB: 02/28/1991    DOA: 05/14/2021  PCP: Patient, No Pcp Per (Inactive)   I have briefly reviewed patients previous medical records in [facility].  Chief Complaint  Patient presents with   Wound Check    Brief Narrative:  29-year-old male with no significant past medical history, presented to the ED due to worsening redness, pain and swelling of the right leg following an abrasion to the leg sustained approximately 1.5 months ago with broken wood log splinter.  The leg was reportedly okay until 3 days ago when symptoms abruptly started.  Also noted purulent drainage.  Admitted for sepsis without acute organ dysfunction, POA, due to right leg cellulitis with abscess.  Orthopedics consulted and s/p excisional debridement of abscess of right leg and local tissue rearrangement for wound closure 7/1.  Assessment & Plan:  Active Problems:   Cellulitis of right lower extremity   Abscess of right lower leg   Sepsis without acute organ dysfunction (HCC)   Sepsis without acute organ dysfunction, POA, due to right lower leg cellulitis and abscess: Met sepsis criteria on admission including fever, tachycardia, tachypnea, leukocytosis and source of infection.  Had a leg wound following injury with a broken wood splinter approximately 6 weeks ago with symptoms starting 3 days PTA.  MRI confirms right lower leg cellulitis with a 2.5 x 0.6 x 3 cm abscess and subcutaneous fatty tissues along the medial diaphysis of the tibia approximately 20 cm above the tibial plateau.  No myositis/fasciitis or osteomyelitis.  Orthopedics consulted and s/p excisional debridement of abscess of right leg and local tissue rearrangement for wound closure 7/1.  Patient confirms that he had a tetanus shot in 2018.  Venous Doppler negative for DVT.  Blood cultures x2: NTD.  Wound gram stain (preop): Few group A strep isolated.  Surgical cultures also confirmed rare group A  strep.  Vancomycin switched to IV ampicillin.  As per ID pharmacist, preop wound cultures showing group A strep and MSSA, IV ampicillin switched to cefazolin and then p.o. Keflex at time of discharge to complete total 10 days.  As per orthopedics, Penrose drain removed, incision looks good, final operative cultures pending, WBAT, dressing changes as needed.  Continues to improve.  Await orthopedics decision regarding timing of discharge.  Polysubstance abuse (alcohol, tobacco, THC): To admitting MD he reported that he has cut down alcohol use significantly since 2019.  Ongoing tobacco use and not considering quitting at this time despite cessation counseling.  Advised cessation of all substances.  Per RN, asking if he can go out to smoke.  Hypokalemia: Replaced.    Leukocytosis: Secondary to infectious etiology above.  Down to 16 K on 7/3.  Follow CBC in a.m.  Body mass index is 29.76 kg/m.    DVT prophylaxis: SCDs Start: 05/16/21 1340 enoxaparin (LOVENOX) injection 40 mg Start: 05/15/21 1000     Code Status: Full Code Family Communication: None at bedside. Disposition:  Status is: Inpatient  Remains inpatient appropriate because:Inpatient level of care appropriate due to severity of illness  Dispo: The patient is from: Home              Anticipated d/c is to: Home              Patient currently is not medically stable to d/c.   Difficult to place patient No        Consultants:   Orthopedics  Procedures:     s/p excisional debridement of abscess of right leg and local tissue rearrangement for wound closure 7/1.  Antimicrobials:    Anti-infectives (From admission, onward)    Start     Dose/Rate Route Frequency Ordered Stop   05/18/21 0900  ceFAZolin (ANCEF) IVPB 1 g/50 mL premix        1 g 100 mL/hr over 30 Minutes Intravenous Every 8 hours 05/18/21 0813     05/16/21 2100  ampicillin (OMNIPEN) 2 g in sodium chloride 0.9 % 100 mL IVPB  Status:  Discontinued        2 g 300  mL/hr over 20 Minutes Intravenous Every 6 hours 05/16/21 1324 05/18/21 0813   05/16/21 1430  ceFAZolin (ANCEF) IVPB 2g/100 mL premix  Status:  Discontinued        2 g 200 mL/hr over 30 Minutes Intravenous Every 6 hours 05/16/21 1339 05/16/21 1341   05/16/21 1045  ceFAZolin (ANCEF) IVPB 2g/100 mL premix        2 g 200 mL/hr over 30 Minutes Intravenous On call to O.R. 05/16/21 1034 05/16/21 1134   05/15/21 1000  vancomycin (VANCOREADY) IVPB 1250 mg/250 mL  Status:  Discontinued        1,250 mg 166.7 mL/hr over 90 Minutes Intravenous Every 12 hours 05/15/21 0302 05/16/21 1325   05/14/21 2330  vancomycin (VANCOREADY) IVPB 1500 mg/300 mL        1,500 mg 150 mL/hr over 120 Minutes Intravenous  Once 05/14/21 2309 05/15/21 0220   05/14/21 2315  piperacillin-tazobactam (ZOSYN) IVPB 3.375 g        3.375 g 100 mL/hr over 30 Minutes Intravenous  Once 05/14/21 2309 05/15/21 0054         Subjective:  Pain better.  Able to ambulate in the room to the bathroom.  Objective:   Vitals:   05/17/21 2112 05/18/21 1407 05/18/21 2134 05/19/21 0814  BP: (!) 134/58 (!) 127/50 132/71 113/61  Pulse: 79 70 71 72  Resp: _0 Temp: 98.3 F (36.8 C) 98 F (36.7 C) 97.9 F (36.6 C) 97.7 F (36.5 C)  TempSrc:   Oral Oral  SpO2: 100% 98% 95% 99%  Weight:      Height:        General exam: Young male, moderately built and nourished lying comfortably propped up in bed without distress. Respiratory system: Clear to auscultation. Respiratory effort normal. Cardiovascular system: S1 & S2 heard, RRR. No JVD, murmurs, rubs, gallops or clicks. No pedal edema.   Gastrointestinal system: Abdomen is nondistended, soft and nontender. No organomegaly or masses felt. Normal bowel sounds heard. Central nervous system: Alert and oriented. No focal neurological deficits. Extremities: Symmetric 5 x 5 power.  Right leg dressing clean and dry.  Defer to orthopedics to take down dressing and examine.  Swelling  proximal and distal to dressing resolved.  Good distal pulses.  Normal color and movement of ankle and toes. Skin: As above Psychiatry: Judgement and insight appear normal. Mood & affect appropriate.     Data Reviewed:   I have personally reviewed following labs and imaging studies   CBC: Recent Labs  Lab 05/14/21 2020 05/15/21 0320 05/16/21 0527 05/17/21 0920 05/18/21 0611  WBC 20.4*   < > 12.0* 19.0* 16.1*  NEUTROABS 17.7*  --   --   --   --   HGB 15.5   < > 17.1* 12.5* 12.8*  HCT 47.9   < > 53.9* 36.7* 39.2  MCV 88.2   < >  87.8 85.3 87.5  PLT 287   < > 194 307 332   < > = values in this interval not displayed.    Basic Metabolic Panel: Recent Labs  Lab 05/15/21 0430 05/16/21 0527 05/17/21 0920  NA 136 137 136  K 3.3* 4.2 4.2  CL 103 106 103  CO2 _0 GLUCOSE 118* 95 129*  BUN 7 7 5*  CREATININE 0.81 0.70 0.72  CALCIUM 8.5* 8.1* 8.5*    Liver Function Tests: Recent Labs  Lab 05/14/21 2339  AST 15  ALT 13  ALKPHOS 73  BILITOT 0.6  PROT 6.7  ALBUMIN 3.5    CBG: No results for input(s): GLUCAP in the last 168 hours.  Microbiology Studies:   Recent Results (from the past 240 hour(s))  Aerobic/Anaerobic Culture w Gram Stain (surgical/deep wound)     Status: None (Preliminary result)   Collection Time: 05/14/21  7:28 PM   Specimen: Wound  Result Value Ref Range Status   Specimen Description WOUND  Final   Special Requests RL  Final   Gram Stain   Final    MODERATE WBC PRESENT, PREDOMINANTLY PMN MODERATE GRAM POSITIVE COCCI IN PAIRS IN CHAINS Performed at [facility], [address] [place], [place] [address]    Culture   Final    FEW GROUP A STREP (S.PYOGENES) ISOLATED Beta hemolytic streptococci are predictably susceptible to penicillin and other beta lactams. Susceptibility testing not routinely performed. RARE STAPHYLOCOCCUS AUREUS NO ANAEROBES ISOLATED; CULTURE IN PROGRESS FOR 5 DAYS    Report Status PENDING  Incomplete    Organism ID, Bacteria STAPHYLOCOCCUS AUREUS  Final      Susceptibility   Staphylococcus aureus - MIC*    CIPROFLOXACIN <=0.5 SENSITIVE Sensitive     ERYTHROMYCIN <=0.25 SENSITIVE Sensitive     GENTAMICIN <=0.5 SENSITIVE Sensitive     OXACILLIN <=0.25 SENSITIVE Sensitive     TETRACYCLINE <=1 SENSITIVE Sensitive     VANCOMYCIN 1 SENSITIVE Sensitive     TRIMETH/SULFA <=10 SENSITIVE Sensitive     CLINDAMYCIN <=0.25 SENSITIVE Sensitive     RIFAMPIN <=0.5 SENSITIVE Sensitive     Inducible Clindamycin NEGATIVE Sensitive     * RARE STAPHYLOCOCCUS AUREUS  Blood culture (routine x 2)     Status: None (Preliminary result)   Collection Time: 05/14/21 11:15 PM   Specimen: BLOOD  Result Value Ref Range Status   Specimen Description BLOOD RIGHT ANTECUBITAL  Final   Special Requests   Final    BOTTLES DRAWN AEROBIC AND ANAEROBIC Blood Culture adequate volume   Culture   Final    NO GROWTH 4 DAYS Performed at [facility], [address] [place], [place] [address]    Report Status PENDING  Incomplete  Blood culture (routine x 2)     Status: None (Preliminary result)   Collection Time: 05/14/21 11:22 PM   Specimen: BLOOD RIGHT HAND  Result Value Ref Range Status   Specimen Description BLOOD RIGHT HAND  Final   Special Requests   Final    BOTTLES DRAWN AEROBIC AND ANAEROBIC Blood Culture results may not be optimal due to an inadequate volume of blood received in culture bottles   Culture   Final    NO GROWTH 4 DAYS Performed at [facility], [place] [address], [place], [place] [address]    Report Status PENDING  Incomplete  Resp Panel by RT-PCR (Flu A&B, Covid) Nasopharyngeal Swab     Status: None  Collection Time: 05/14/21 11:55 PM   Specimen: Nasopharyngeal Swab; Nasopharyngeal(NP) swabs in vial transport medium  Result Value Ref Range Status   SARS Coronavirus 2 by RT PCR NEGATIVE NEGATIVE Final    Comment: (NOTE) SARS-CoV-2 target nucleic acids are NOT DETECTED.  The  SARS-CoV-2 RNA is generally detectable in upper respiratory specimens during the acute phase of infection. The lowest concentration of SARS-CoV-2 viral copies this assay can detect is 138 copies/mL. A negative result does not preclude SARS-Cov-2 infection and should not be used as the sole basis for treatment or other patient management decisions. A negative result may occur with  improper specimen collection/handling, submission of specimen other than nasopharyngeal swab, presence of viral mutation(s) within the areas targeted by this assay, and inadequate number of viral copies(<138 copies/mL). A negative result must be combined with clinical observations, patient history, and epidemiological information. The expected result is Negative.  Fact Sheet for Patients:  EntrepreneurPulse.com.au  Fact Sheet for Healthcare Providers:  IncredibleEmployment.be  This test is no t yet approved or cleared by the [place] FDA and  has been authorized for detection and/or diagnosis of SARS-CoV-2 by FDA under an Emergency Use Authorization (EUA). This EUA will remain  in effect (meaning this test can be used) for the duration of the COVID-19 declaration under Section 564(b)(1) of the Act, 21 U.S.C.section 360bbb-3(b)(1), unless the authorization is terminated  or revoked sooner.       Influenza A by PCR NEGATIVE NEGATIVE Final   Influenza B by PCR NEGATIVE NEGATIVE Final    Comment: (NOTE) The Xpert Xpress SARS-CoV-2/FLU/RSV plus assay is intended as an aid in the diagnosis of influenza from Nasopharyngeal swab specimens and should not be used as a sole basis for treatment. Nasal washings and aspirates are unacceptable for Xpert Xpress SARS-CoV-2/FLU/RSV testing.  Fact Sheet for Patients: EntrepreneurPulse.com.au  Fact Sheet for Healthcare Providers: IncredibleEmployment.be  This test is not yet approved or  cleared by the [place] FDA and has been authorized for detection and/or diagnosis of SARS-CoV-2 by FDA under an Emergency Use Authorization (EUA). This EUA will remain in effect (meaning this test can be used) for the duration of the COVID-19 declaration under Section 564(b)(1) of the Act, 21 U.S.C. section 360bbb-3(b)(1), unless the authorization is terminated or revoked.  Performed at [facility], [place] [address], [place], [place] [address]   Urine culture     Status: None   Collection Time: 05/15/21  1:28 AM   Specimen: In/Out Cath Urine  Result Value Ref Range Status   Specimen Description IN/OUT CATH URINE  Final   Special Requests NONE  Final   Culture   Final    NO GROWTH Performed at [facility], [place] [address] [place], [place] [address]    Report Status 05/16/2021 FINAL  Final  Surgical pcr screen     Status: None   Collection Time: 05/16/21  8:49 AM   Specimen: Nasal Mucosa; Nasal Swab  Result Value Ref Range Status   MRSA, PCR NEGATIVE NEGATIVE Final   Staphylococcus aureus NEGATIVE NEGATIVE Final    Comment: (NOTE) The Xpert SA Assay (FDA approved for NASAL specimens in patients 74 years of age and older), is one component of a comprehensive surveillance program. It is not intended to diagnose infection nor to guide or monitor treatment. Performed at [facility], [place] [address], [place], [place] [address]   Aerobic/Anaerobic Culture w Gram Stain (surgical/deep wound)  Status: None (Preliminary result)   Collection Time: 05/16/21 11:46 AM   Specimen: Soft Tissue, Other  Result Value Ref Range Status   Specimen Description TISSUE RIGHT LEG SPEC A  Final   Special Requests RIGHT LEG  Final   Gram Stain   Final    FEW WBC PRESENT, PREDOMINANTLY PMN FEW GRAM POSITIVE COCCI IN PAIRS Performed at [facility], [address] [place], [place] [address]    Culture   Final    RARE GROUP A STREP (S.PYOGENES) ISOLATED Beta  hemolytic streptococci are predictably susceptible to penicillin and other beta lactams. Susceptibility testing not routinely performed. NO ANAEROBES ISOLATED; CULTURE IN PROGRESS FOR 5 DAYS    Report Status PENDING  Incomplete     Radiology Studies:  No results found.   Scheduled Meds:    docusate sodium  100 mg Oral BID   enoxaparin (LOVENOX) injection  40 mg Subcutaneous Daily    Continuous Infusions:     ceFAZolin (ANCEF) IV 1 g (05/19/21 0809)     LOS: 4 days     Anand Hongalgi, MD, FACP, SFHM. [facility]    To contact the attending provider between 7A-7P or the covering provider during after hours 7P-7A, please log into the web site [email or web address] and access using universal [place] password for that web site. If you do not have the password, please call the hospital operator.  05/19/2021, 1:55 PM   

## 2021-05-18 NOTE — Progress Notes (Signed)
2230 Pt refused SCDs. Pt educated.

## 2021-05-18 NOTE — Progress Notes (Signed)
PT Cancellation Note  Patient Details Name: Tennessee Perra MRN: [id number] DOB: 07-28-1991   Cancelled Treatment:    Reason Eval/Treat Not Completed: Patient declined, no reason specified. Attempted to see pt for a second time today; however, he again refused and declined any mobility at this time. PT will continue to follow-up with pt while admitted as available and appropriate.    Alessandra Bevels Fielding Mault 05/18/2021, 12:16 PM

## 2021-05-18 NOTE — Plan of Care (Signed)
  Problem: Health Behavior/Discharge Planning: Goal: Ability to manage health-related needs will improve Outcome: Progressing   Problem: Clinical Measurements: Goal: Will remain free from infection Outcome: Progressing   Problem: Activity: Goal: Risk for activity intolerance will decrease Outcome: Progressing   Problem: Pain Managment: Goal: General experience of comfort will improve Outcome: Progressing   

## 2021-05-18 NOTE — Progress Notes (Signed)
Patient stated he pulled his IV out and wants to go out to smoke. Explained to pt that doctor made it clear to nurse this morning that he is not going out to smoke. Pt still wants to go out he has to sign AMA. Patient stated he didn't want to sign AMA and went back to room.

## 2021-05-18 NOTE — Progress Notes (Signed)
I removed the dressing from the patient's right leg and assessed his incision.  I also remove the Penrose drain.  The incision itself looks good.  His calf is soft.  There is no cellulitis.  I placed a new dressing.  The final cultures and sensitivities are still pending.  He can weight-bear as tolerated on the right lower extremity and dressings will be changed as needed.  We will continue to follow.

## 2021-05-18 NOTE — Progress Notes (Signed)
PT Cancellation Note  Patient Details Name: Mike Sellers MRN: [id number] DOB: 1991-10-28   Cancelled Treatment:    Reason Eval/Treat Not Completed: Patient declined, no reason specified. Pt reporting that he was told by the MD to "not move around too much" until MD was able to come assess the dressing at some point today. PT will continue to follow-up with pt acutely as available and appropriate.    Alessandra Bevels Victorious Cosio 05/18/2021, 8:35 AM

## 2021-05-18 NOTE — Plan of Care (Signed)
  Problem: Pain Managment: Goal: General experience of comfort will improve Outcome: Progressing   Problem: Safety: Goal: Ability to remain free from injury will improve Outcome: Progressing   

## 2021-05-18 NOTE — Progress Notes (Addendum)
Abx was recently switched to ampicillin for group A strep skin soft tissue infection but would culture has grown some MSSA now. D/w Dr. Waymon Amato and we will optimize to cefazolin instead and PO keflex when he is ready.  Mike Sellers, PharmD, BCIDP, AAHIVP, CPP Infectious Disease Pharmacist 05/18/2021 8:14 AM

## 2021-05-19 LAB — AEROBIC/ANAEROBIC CULTURE W GRAM STAIN (SURGICAL/DEEP WOUND)

## 2021-05-19 NOTE — Progress Notes (Signed)
     Subjective: 3 Days Post-Op Procedure(s) (LRB): IRRIGATION AND DEBRIDEMENT LEG (Right) Sleeping comfortably, right leg dressing Micro Srept Grp A On Ancef.  Patient reports pain as sleeping, not disturbed. .    Objective:   VITALS:  Temp:  [97.7 F (36.5 C)-98 F (36.7 C)] 97.7 F (36.5 C) (07/04 0814) Pulse Rate:  [70-72] 72 (07/04 0814) Resp:  [17-18] 18 (07/04 0814) BP: (113-132)/(50-71) 113/61 (07/04 0814) SpO2:  [95 %-99 %] 99 % (07/04 0814)  Neurologically intact Intact pulses distally Compartment soft   LABS Recent Labs    05/17/21 0920 05/18/21 0611  HGB 12.5* 12.8*  WBC 19.0* 16.1*  PLT 307 332   Recent Labs    05/17/21 0920  NA 136  K 4.2  CL 103  CO2 27  BUN 5*  CREATININE 0.72  GLUCOSE 129*   No results for input(s): LABPT, INR in the last 72 hours.   Assessment/Plan: 3 Days Post-Op Procedure(s) (LRB): IRRIGATION AND DEBRIDEMENT LEG (Right)  Advance diet Up with therapy D/C IV fluids Continue ABX therapy due to Post-op infection  Vira Browns 05/19/2021, 11:31 AM Patient ID: Mike Sellers, male   DOB: [birth year], 30 y.o.   MRN: [id number]

## 2021-05-19 NOTE — Progress Notes (Signed)
Occupational Therapy Treatment Patient Details Name: Mike Sellers MRN: [id number] DOB: 08-25-1991 Today's Date: 05/19/2021    History of present illness The pt is a 30 yo male presenting 6/29 due to redness, swelling, and drainage from wound on R shin sustained in an accident on 6/17. The pt is now s/p I&D of abscess on RLE on 7/1. No significant PMH other than polysubstance abuse.   OT comments  Pt performing OOB ADL with RW for stability. Pt currently, performing ADL routine with no difficulty; pt reports that he feels better other than pain in RLE. Pt given education to use call bell as pt might need assist with IV pole and RW. Pt does not require continued OT skilled services. OT signing off. Thank you.   Follow Up Recommendations  No OT follow up    Equipment Recommendations  None recommended by OT    Recommendations for Other Services      Precautions / Restrictions Precautions Precautions: Fall Restrictions Weight Bearing Restrictions: Yes RLE Weight Bearing: Weight bearing as tolerated       Mobility Bed Mobility Overal bed mobility: Independent                  Transfers Overall transfer level: Modified independent Equipment used: Rolling walker (2 wheeled)             General transfer comment: supervision during session    Balance Overall balance assessment: No apparent balance deficits (not formally assessed)                                         ADL either performed or assessed with clinical judgement   ADL Overall ADL's : Needs assistance/impaired Eating/Feeding: Independent   Grooming: Supervision/safety;Standing   Upper Body Bathing: Supervision/ safety;Standing   Lower Body Bathing: Supervison/ safety;Sitting/lateral leans;Sit to/from stand   Upper Body Dressing : Supervision/safety;Sitting       Toilet Transfer: Supervision/safety;RW   Toileting- Clothing Manipulation and Hygiene: Supervision/safety;Sit  to/from stand       Functional mobility during ADLs: Supervision/safety;Rolling walker General ADL Comments: Pt performing ADL routine with no difficulty; pt reports that he feels better other than pain in RLE. Pt given education to use call bell as pt might need assist with IV pole and RW.     Vision       Perception     Praxis      Cognition Arousal/Alertness: Awake/alert Behavior During Therapy: WFL for tasks assessed/performed;Impulsive Overall Cognitive Status: Within Functional Limits for tasks assessed                                 General Comments: Pt appears less imprulsive today and reports to "feel better when I use the walker."        Exercises     Shoulder Instructions       General Comments VSS    Pertinent Vitals/ Pain       Pain Assessment: 0-10 Pain Score: 3  Pain Location: R shin Pain Descriptors / Indicators: Aching;Discomfort;Grimacing;Throbbing Pain Intervention(s): Monitored during session  Home Living                                          Prior  Functioning/Environment              Frequency  Min 1X/week        Progress Toward Goals  OT Goals(current goals can now be found in the care plan section)  Progress towards OT goals: Progressing toward goals  Acute Rehab OT Goals Patient Stated Goal: to go outside for a smoke OT Goal Formulation: With patient Time For Goal Achievement: 05/31/21 Potential to Achieve Goals: Fair ADL Goals Pt Will Perform Grooming: with modified independence;standing Pt Will Transfer to Toilet: with modified independence;ambulating;regular height toilet Pt/caregiver will Perform Home Exercise Program: Increased strength;Both right and left upper extremity;With theraband;Independently  Plan Discharge plan remains appropriate    Co-evaluation                 AM-PAC OT "6 Clicks" Daily Activity     Outcome Measure   Help from another person eating  meals?: None Help from another person taking care of personal grooming?: A Little Help from another person toileting, which includes using toliet, bedpan, or urinal?: A Little Help from another person bathing (including washing, rinsing, drying)?: A Little Help from another person to put on and taking off regular upper body clothing?: None Help from another person to put on and taking off regular lower body clothing?: A Little 6 Click Score: 20    End of Session Equipment Utilized During Treatment: Gait belt;Rolling walker  OT Visit Diagnosis: Unsteadiness on feet (R26.81);Pain Pain - Right/Left: Right Pain - part of body: Leg   Activity Tolerance Patient limited by pain   Patient Left in bed;with call bell/phone within reach   Nurse Communication Mobility status        Time: 3382-5053 OT Time Calculation (min): 13 min  Charges: OT General Charges $OT Visit: 1 Visit OT Treatments $Self Care/Home Management : 8-22 mins  Flora Lipps, OTR/L Acute Rehabilitation Services Pager: [phone] Office: [phone]    Cynthia Stainback C 05/19/2021, 2:42 PM

## 2021-05-19 NOTE — Progress Notes (Signed)
Physical Therapy Treatment Patient Details Name: Mike Sellers MRN: [id number] DOB: October 09, 1991 Today's Date: 05/19/2021    History of Present Illness The pt is a 30 yo male presenting 6/29 due to redness, swelling, and drainage from wound on R shin sustained in an accident on 6/17. The pt is now s/p I&D of abscess on RLE on 7/1. No significant PMH other than polysubstance abuse.    PT Comments    Pt resting in bed upon arrival to room, motivated to d/c. Pt hallway ambulatory with RW today, complaining of RLE specifically R ankle pain, RN notified and PT educated pt on ankle ROM exercises. Pt declines need to practice steps. Pt most comfortable with use of RW at d/c, equipment recommendations updated accordingly.    Follow Up Recommendations  No PT follow up;Supervision - Intermittent     Equipment Recommendations  Rolling walker with 5" wheels    Recommendations for Other Services       Precautions / Restrictions Precautions Precautions: Fall (moderate) Restrictions Weight Bearing Restrictions: No RLE Weight Bearing: Weight bearing as tolerated    Mobility  Bed Mobility Overal bed mobility: Modified Independent                  Transfers Overall transfer level: Modified independent Equipment used: Rolling walker (2 wheeled)             General transfer comment: supervision during session  Ambulation/Gait Ambulation/Gait assistance: Supervision Gait Distance (Feet): 85 Feet Assistive device: Rolling walker (2 wheeled) Gait Pattern/deviations: Step-through pattern;Decreased stride length;Trunk flexed;Antalgic Gait velocity: decr   General Gait Details: supervision for safety, verbal cuing for upright posture and placement in RW.   Stairs Stairs:  (pt declines stair training, states "they're not even steps, they're like this big (demonstrates two inches)". PT explained sequencing (up with the good leg, down with the bad leg leading) and RW placement, pt  expresses understanding)           Wheelchair Mobility    Modified Rankin (Stroke Patients Only)       Balance Overall balance assessment: No apparent balance deficits (not formally assessed) (use of RW for offweighting RLE only)                                          Cognition Arousal/Alertness: Awake/alert Behavior During Therapy: WFL for tasks assessed/performed Overall Cognitive Status: Within Functional Limits for tasks assessed                                 General Comments: Pt appears less imprulsive today and reports to "feel better when I use the walker."      Exercises      General Comments General comments (skin integrity, edema, etc.): VSS      Pertinent Vitals/Pain Pain Assessment: Faces Pain Score: 3  Faces Pain Scale: Hurts even more Pain Location: R shin, ankle Pain Descriptors / Indicators: Aching;Discomfort;Grimacing;Throbbing Pain Intervention(s): Limited activity within patient's tolerance;Monitored during session    Home Living                      Prior Function            PT Goals (current goals can now be found in the care plan section) Acute Rehab PT Goals Patient Stated Goal:  to go outside for a smoke PT Goal Formulation: With patient Time For Goal Achievement: 05/31/21 Potential to Achieve Goals: Good Progress towards PT goals: Progressing toward goals    Frequency    Min 3X/week      PT Plan Current plan remains appropriate    Co-evaluation              AM-PAC PT "6 Clicks" Mobility   Outcome Measure  Help needed turning from your back to your side while in a flat bed without using bedrails?: None Help needed moving from lying on your back to sitting on the side of a flat bed without using bedrails?: None Help needed moving to and from a bed to a chair (including a wheelchair)?: None Help needed standing up from a chair using your arms (e.g., wheelchair or bedside  chair)?: None Help needed to walk in hospital room?: None Help needed climbing 3-5 steps with a railing? : A Little 6 Click Score: 23    End of Session   Activity Tolerance: Patient tolerated treatment well;Patient limited by pain Patient left: in bed;with call bell/phone within reach Nurse Communication: Mobility status PT Visit Diagnosis: Other abnormalities of gait and mobility (R26.89);Pain Pain - Right/Left: Right Pain - part of body: Leg     Time: 0102-7253 PT Time Calculation (min) (ACUTE ONLY): 13 min  Charges:  $Gait Training: 8-22 mins                     Mike Sellers, PT DPT Acute Rehabilitation Services Pager [phone]  Office [phone]    Mike Sellers 05/19/2021, 4:21 PM

## 2021-05-19 NOTE — Progress Notes (Addendum)
PROGRESS NOTE   Mike Sellers  MRN:[id number]    DOB: 05/13/1991    DOA: 05/14/2021  PCP: Patient, No Pcp Per (Inactive)   I have briefly reviewed patients previous medical records in [facility].  Chief Complaint  Patient presents with   Wound Check    Brief Narrative:  30-year-old male with no significant past medical history, presented to the ED due to worsening redness, pain and swelling of the right leg following an abrasion to the leg sustained approximately 1.5 months ago with broken wood log splinter.  The leg was reportedly okay until 3 days ago when symptoms abruptly started.  Also noted purulent drainage.  Admitted for sepsis without acute organ dysfunction, POA, due to right leg cellulitis with abscess.  Orthopedics consulted and s/p excisional debridement of abscess of right leg and local tissue rearrangement for wound closure 7/1.  Assessment & Plan:  Active Problems:   Cellulitis of right lower extremity   Abscess of right lower leg   Sepsis without acute organ dysfunction (HCC)   Sepsis without acute organ dysfunction, POA, due to right lower leg cellulitis and abscess: Met sepsis criteria on admission including fever, tachycardia, tachypnea, leukocytosis and source of infection.  Had a leg wound following injury with a broken wood splinter approximately 6 weeks ago with symptoms starting 3 days PTA.  MRI confirms right lower leg cellulitis with a 2.5 x 0.6 x 3 cm abscess and subcutaneous fatty tissues along the medial diaphysis of the tibia approximately 20 cm above the tibial plateau.  No myositis/fasciitis or osteomyelitis.  Orthopedics consulted and s/p excisional debridement of abscess of right leg and local tissue rearrangement for wound closure 7/1.  Patient confirms that he had a tetanus shot in 2018.  Venous Doppler negative for DVT.  Blood cultures x2: NTD.  Wound gram stain (preop): Few group A strep isolated.  Surgical cultures also confirmed rare group A  strep.  Vancomycin switched to IV ampicillin.  As per ID pharmacist, preop wound cultures showing group A strep and MSSA, IV ampicillin switched to cefazolin and then p.o. Keflex at time of discharge to complete total 10 days.  As per orthopedics, Penrose drain removed, incision looks good, final operative cultures pending, WBAT, dressing changes as needed.  Continues to improve.  Await orthopedics decision regarding timing of discharge.  PT and OT recommend no follow-up.  Polysubstance abuse (alcohol, tobacco, THC): To admitting MD he reported that he has cut down alcohol use significantly since 2019.  Ongoing tobacco use and not considering quitting at this time despite cessation counseling.  Advised cessation of all substances.  Per RN, asking if he can go out to smoke.  Hypokalemia: Replaced.    Leukocytosis: Secondary to infectious etiology above.  Down to 16 K on 7/3.  Follow CBC in a.m.  Body mass index is 29.76 kg/m.    DVT prophylaxis: SCDs Start: 05/16/21 1340 enoxaparin (LOVENOX) injection 40 mg Start: 05/15/21 1000     Code Status: Full Code Family Communication: None at bedside. Disposition:  Status is: Inpatient  Remains inpatient appropriate because:Inpatient level of care appropriate due to severity of illness  Dispo: The patient is from: Home              Anticipated d/c is to: Home              Patient currently is not medically stable to d/c.   Difficult to place patient No          Consultants:   Orthopedics  Procedures:   s/p excisional debridement of abscess of right leg and local tissue rearrangement for wound closure 7/1.  Antimicrobials:    Anti-infectives (From admission, onward)    Start     Dose/Rate Route Frequency Ordered Stop   05/18/21 0900  ceFAZolin (ANCEF) IVPB 1 g/50 mL premix        1 g 100 mL/hr over 30 Minutes Intravenous Every 8 hours 05/18/21 0813     05/16/21 2100  ampicillin (OMNIPEN) 2 g in sodium chloride 0.9 % 100 mL IVPB   Status:  Discontinued        2 g 300 mL/hr over 20 Minutes Intravenous Every 6 hours 05/16/21 1324 05/18/21 0813   05/16/21 1430  ceFAZolin (ANCEF) IVPB 2g/100 mL premix  Status:  Discontinued        2 g 200 mL/hr over 30 Minutes Intravenous Every 6 hours 05/16/21 1339 05/16/21 1341   05/16/21 1045  ceFAZolin (ANCEF) IVPB 2g/100 mL premix        2 g 200 mL/hr over 30 Minutes Intravenous On call to O.R. 05/16/21 1034 05/16/21 1134   05/15/21 1000  vancomycin (VANCOREADY) IVPB 1250 mg/250 mL  Status:  Discontinued        1,250 mg 166.7 mL/hr over 90 Minutes Intravenous Every 12 hours 05/15/21 0302 05/16/21 1325   05/14/21 2330  vancomycin (VANCOREADY) IVPB 1500 mg/300 mL        1,500 mg 150 mL/hr over 120 Minutes Intravenous  Once 05/14/21 2309 05/15/21 0220   05/14/21 2315  piperacillin-tazobactam (ZOSYN) IVPB 3.375 g        3.375 g 100 mL/hr over 30 Minutes Intravenous  Once 05/14/21 2309 05/15/21 0054         Subjective:  Pain better.  Able to ambulate in the room to the bathroom.  Objective:   Vitals:   05/18/21 1407 05/18/21 2134 05/19/21 0814 05/19/21 1546  BP: (!) 127/50 132/71 113/61 132/81  Pulse: 70 71 72 87  Resp: _0 Temp: 98 F (36.7 C) 97.9 F (36.6 C) 97.7 F (36.5 C) 98.9 F (37.2 C)  TempSrc:  Oral Oral Oral  SpO2: 98% 95% 99% 97%  Weight:      Height:        General exam: Young male, moderately built and nourished lying comfortably propped up in bed without distress. Respiratory system: Clear to auscultation. Respiratory effort normal. Cardiovascular system: S1 & S2 heard, RRR. No JVD, murmurs, rubs, gallops or clicks. No pedal edema.   Gastrointestinal system: Abdomen is nondistended, soft and nontender. No organomegaly or masses felt. Normal bowel sounds heard. Central nervous system: Alert and oriented. No focal neurological deficits. Extremities: Symmetric 5 x 5 power.  Right leg dressing clean and dry.  Defer to orthopedics to take down  dressing and examined.  Swelling proximal and distal to the dressing have resolved.  He has good distal pulses.  Normal color and movement of ankle and toes.   Skin: As above Psychiatry: Judgement and insight appear normal. Mood & affect appropriate.     Data Reviewed:   I have personally reviewed following labs and imaging studies   CBC: Recent Labs  Lab 05/14/21 2020 05/15/21 0320 05/16/21 0527 05/17/21 0920 05/18/21 0611  WBC 20.4*   < > 12.0* 19.0* 16.1*  NEUTROABS 17.7*  --   --   --   --   HGB 15.5   < > 17.1* 12.5* 12.8*  HCT 47.9   < > 53.9* 36.7* 39.2  MCV 88.2   < > 87.8 85.3 87.5  PLT 287   < > 194 307 332   < > = values in this interval not displayed.    Basic Metabolic Panel: Recent Labs  Lab 05/15/21 0430 05/16/21 0527 05/17/21 0920  NA 136 137 136  K 3.3* 4.2 4.2  CL 103 106 103  CO2 24 25 27  GLUCOSE 118* 95 129*  BUN 7 7 5*  CREATININE 0.81 0.70 0.72  CALCIUM 8.5* 8.1* 8.5*    Liver Function Tests: Recent Labs  Lab 05/14/21 2339  AST 15  ALT 13  ALKPHOS 73  BILITOT 0.6  PROT 6.7  ALBUMIN 3.5    CBG: No results for input(s): GLUCAP in the last 168 hours.  Microbiology Studies:   Recent Results (from the past 240 hour(s))  Aerobic/Anaerobic Culture w Gram Stain (surgical/deep wound)     Status: None   Collection Time: 05/14/21  7:28 PM   Specimen: Wound  Result Value Ref Range Status   Specimen Description WOUND  Final   Special Requests RL  Final   Gram Stain   Final    MODERATE WBC PRESENT, PREDOMINANTLY PMN MODERATE GRAM POSITIVE COCCI IN PAIRS IN CHAINS    Culture   Final    FEW GROUP A STREP (S.PYOGENES) ISOLATED Beta hemolytic streptococci are predictably susceptible to penicillin and other beta lactams. Susceptibility testing not routinely performed. RARE STAPHYLOCOCCUS AUREUS NO ANAEROBES ISOLATED Performed at [facility], [address] [place], [place] [address]    Report Status 05/19/2021 FINAL  Final    Organism ID, Bacteria STAPHYLOCOCCUS AUREUS  Final      Susceptibility   Staphylococcus aureus - MIC*    CIPROFLOXACIN <=0.5 SENSITIVE Sensitive     ERYTHROMYCIN <=0.25 SENSITIVE Sensitive     GENTAMICIN <=0.5 SENSITIVE Sensitive     OXACILLIN <=0.25 SENSITIVE Sensitive     TETRACYCLINE <=1 SENSITIVE Sensitive     VANCOMYCIN 1 SENSITIVE Sensitive     TRIMETH/SULFA <=10 SENSITIVE Sensitive     CLINDAMYCIN <=0.25 SENSITIVE Sensitive     RIFAMPIN <=0.5 SENSITIVE Sensitive     Inducible Clindamycin NEGATIVE Sensitive     * RARE STAPHYLOCOCCUS AUREUS  Blood culture (routine x 2)     Status: None (Preliminary result)   Collection Time: 05/14/21 11:15 PM   Specimen: BLOOD  Result Value Ref Range Status   Specimen Description BLOOD RIGHT ANTECUBITAL  Final   Special Requests   Final    BOTTLES DRAWN AEROBIC AND ANAEROBIC Blood Culture adequate volume   Culture   Final    NO GROWTH 4 DAYS Performed at [facility], [address] [place], [place] [address]    Report Status PENDING  Incomplete  Blood culture (routine x 2)     Status: None (Preliminary result)   Collection Time: 05/14/21 11:22 PM   Specimen: BLOOD RIGHT HAND  Result Value Ref Range Status   Specimen Description BLOOD RIGHT HAND  Final   Special Requests   Final    BOTTLES DRAWN AEROBIC AND ANAEROBIC Blood Culture results may not be optimal due to an inadequate volume of blood received in culture bottles   Culture   Final    NO GROWTH 4 DAYS Performed at [facility], [address] [place], [place] [address]    Report Status PENDING  Incomplete  Resp Panel by RT-PCR (Flu A&B,   Covid) Nasopharyngeal Swab     Status: None   Collection Time: 05/14/21 11:55 PM   Specimen: Nasopharyngeal Swab; Nasopharyngeal(NP) swabs in vial transport medium  Result Value Ref Range Status   SARS Coronavirus 2 by RT PCR NEGATIVE NEGATIVE Final    Comment: (NOTE) SARS-CoV-2 target nucleic acids are NOT DETECTED.  The  SARS-CoV-2 RNA is generally detectable in upper respiratory specimens during the acute phase of infection. The lowest concentration of SARS-CoV-2 viral copies this assay can detect is 138 copies/mL. A negative result does not preclude SARS-Cov-2 infection and should not be used as the sole basis for treatment or other patient management decisions. A negative result may occur with  improper specimen collection/handling, submission of specimen other than nasopharyngeal swab, presence of viral mutation(s) within the areas targeted by this assay, and inadequate number of viral copies(<138 copies/mL). A negative result must be combined with clinical observations, patient history, and epidemiological information. The expected result is Negative.  Fact Sheet for Patients:  EntrepreneurPulse.com.au  Fact Sheet for Healthcare Providers:  IncredibleEmployment.be  This test is no t yet approved or cleared by the [place] FDA and  has been authorized for detection and/or diagnosis of SARS-CoV-2 by FDA under an Emergency Use Authorization (EUA). This EUA will remain  in effect (meaning this test can be used) for the duration of the COVID-19 declaration under Section 564(b)(1) of the Act, 21 U.S.C.section 360bbb-3(b)(1), unless the authorization is terminated  or revoked sooner.       Influenza A by PCR NEGATIVE NEGATIVE Final   Influenza B by PCR NEGATIVE NEGATIVE Final    Comment: (NOTE) The Xpert Xpress SARS-CoV-2/FLU/RSV plus assay is intended as an aid in the diagnosis of influenza from Nasopharyngeal swab specimens and should not be used as a sole basis for treatment. Nasal washings and aspirates are unacceptable for Xpert Xpress SARS-CoV-2/FLU/RSV testing.  Fact Sheet for Patients: EntrepreneurPulse.com.au  Fact Sheet for Healthcare Providers: IncredibleEmployment.be  This test is not yet approved or  cleared by the [place] FDA and has been authorized for detection and/or diagnosis of SARS-CoV-2 by FDA under an Emergency Use Authorization (EUA). This EUA will remain in effect (meaning this test can be used) for the duration of the COVID-19 declaration under Section 564(b)(1) of the Act, 21 U.S.C. section 360bbb-3(b)(1), unless the authorization is terminated or revoked.  Performed at [facility], [place] [address] [place], [place] [address]   Urine culture     Status: None   Collection Time: 05/15/21  1:28 AM   Specimen: In/Out Cath Urine  Result Value Ref Range Status   Specimen Description IN/OUT CATH URINE  Final   Special Requests NONE  Final   Culture   Final    NO GROWTH Performed at [facility], [place] [address] [place], [place] [address]    Report Status 05/16/2021 FINAL  Final  Surgical pcr screen     Status: None   Collection Time: 05/16/21  8:49 AM   Specimen: Nasal Mucosa; Nasal Swab  Result Value Ref Range Status   MRSA, PCR NEGATIVE NEGATIVE Final   Staphylococcus aureus NEGATIVE NEGATIVE Final    Comment: (NOTE) The Xpert SA Assay (FDA approved for NASAL specimens in patients 71 years of age and older), is one component of a comprehensive surveillance program. It is not intended to diagnose infection nor to guide or monitor treatment. Performed at [facility], [place] [address] [place], [place] [address]  Aerobic/Anaerobic Culture w Gram Stain (surgical/deep wound)     Status: None (Preliminary result)   Collection Time: 05/16/21 11:46 AM   Specimen: Soft Tissue, Other  Result Value Ref Range Status   Specimen Description TISSUE RIGHT LEG SPEC A  Final   Special Requests RIGHT LEG  Final   Gram Stain   Final    FEW WBC PRESENT, PREDOMINANTLY PMN FEW GRAM POSITIVE COCCI IN PAIRS Performed at [facility], [address] [place], [place] [address]    Culture   Final    RARE GROUP A STREP (S.PYOGENES) ISOLATED Beta  hemolytic streptococci are predictably susceptible to penicillin and other beta lactams. Susceptibility testing not routinely performed. NO ANAEROBES ISOLATED; CULTURE IN PROGRESS FOR 5 DAYS    Report Status PENDING  Incomplete     Radiology Studies:  No results found.   Scheduled Meds:    docusate sodium  100 mg Oral BID   enoxaparin (LOVENOX) injection  40 mg Subcutaneous Daily    Continuous Infusions:     ceFAZolin (ANCEF) IV 1 g (05/19/21 1628)     LOS: 4 days     Vernell Leep, MD, [place], [facility]. [facility]    To contact the attending provider between 7A-7P or the covering provider during after hours 7P-7A, please log into the web site [email or web address] and access using universal [place] password for that web site. If you do not have the password, please call the hospital operator.  05/19/2021, 5:16 PM

## 2021-05-20 ENCOUNTER — Telehealth: Payer: Self-pay | Admitting: Orthopedic Surgery

## 2021-05-20 ENCOUNTER — Other Ambulatory Visit (HOSPITAL_COMMUNITY): Payer: Self-pay

## 2021-05-20 LAB — CBC
HCT: 43 % (ref 39.0–52.0)
Hemoglobin: 14.1 g/dL (ref 13.0–17.0)
MCH: 28.8 pg (ref 26.0–34.0)
MCHC: 32.8 g/dL (ref 30.0–36.0)
MCV: 87.9 fL (ref 80.0–100.0)
Platelets: 342 10*3/uL (ref 150–400)
RBC: 4.89 MIL/uL (ref 4.22–5.81)
RDW: 12.8 % (ref 11.5–15.5)
WBC: 19.4 10*3/uL — ABNORMAL HIGH (ref 4.0–10.5)
nRBC: 0.3 % — ABNORMAL HIGH (ref 0.0–0.2)

## 2021-05-20 LAB — CULTURE, BLOOD (ROUTINE X 2)
Culture: NO GROWTH
Culture: NO GROWTH
Special Requests: ADEQUATE

## 2021-05-20 MED ORDER — CEPHALEXIN 500 MG PO CAPS
500.0000 mg | ORAL_CAPSULE | Freq: Four times a day (QID) | ORAL | 0 refills | Status: AC
  Filled 2021-05-20: qty 20, 5d supply, fill #0

## 2021-05-20 MED ORDER — HYDROCODONE-ACETAMINOPHEN 5-325 MG PO TABS
1.0000 | ORAL_TABLET | Freq: Three times a day (TID) | ORAL | 0 refills | Status: AC | PRN
  Filled 2021-05-20: qty 20, 4d supply, fill #0

## 2021-05-20 MED ORDER — NAPROXEN 500 MG PO TABS: 500.0000 mg | ORAL_TABLET | Freq: Two times a day (BID) | ORAL | Status: AC | PRN

## 2021-05-20 NOTE — Plan of Care (Signed)

## 2021-05-20 NOTE — Discharge Summary (Signed)
Physician Discharge Summary  Mike Sellers NKN:[id number] DOB: 12-09-1990  PCP: Patient, No Pcp Per (Inactive)  Admitted from: Home Discharged to: Home  Admit date: 05/14/2021 Discharge date: 05/20/2021  Recommendations for Outpatient Follow-up:    Follow-up Information     Suzan Slick, NP Follow up in 1 week(s).   Specialty: Orthopedic Surgery Why: To be seen with repeat labs (CBC & BMP).  I have discussed these lab draws with Dr. Sharol Given. Contact information: [address] [place] [address] [phone]         Primary Care at [facility]. Go on 07/01/2021.   Specialty: Family Medicine Why: at 1:30pm for hospital follow-up Contact information: [address] [place]: None    Equipment/Devices:     Durable Medical Equipment  (From admission, onward)           Start     Ordered   05/20/21 1134  For home use only DME Walker rolling  Once       Comments: With 5" wheels.  Question Answer Comment  Walker: With 5 Inch Wheels   Patient needs a walker to treat with the following condition Abscess of right leg      05/20/21 1134             Discharge Condition: Improved and stable.   Code Status: Full Code Diet recommendation:  Discharge Diet Orders (From admission, onward)     Start     Ordered   05/20/21 0000  Diet general        05/20/21 1205             Discharge Diagnoses:  Active Problems:   Cellulitis of right lower extremity   Abscess of right lower leg   Sepsis without acute organ dysfunction [facility])   Brief Summary: 30 year old male with no significant past medical history, presented to the ED due to worsening redness, pain and swelling of the right leg following an abrasion to the leg sustained approximately 1.5 months ago with a broken wood log splinter.  The leg was reportedly okay until 3 days prior to admission when symptoms abruptly  started.  Also noted purulent drainage.  Admitted for sepsis without acute organ dysfunction, POA, due to right leg cellulitis with abscess.  Orthopedics consulted and s/p excisional debridement of abscess of right leg and local tissue rearrangement for wound closure 7/1.   Assessment & Plan:    Sepsis without acute organ dysfunction, POA, due to right lower leg cellulitis and abscess: Met sepsis criteria on admission including fever, tachycardia, tachypnea, leukocytosis and source of infection.  Had a leg wound following injury with a broken wood splinter approximately 6 weeks ago with symptoms starting 3 days PTA.  MRI confirms right lower leg cellulitis with a 2.5 x 0.6 x 3 cm abscess and subcutaneous fatty tissues along the medial diaphysis of the tibia approximately 20 cm above the tibial plateau.  No myositis/fasciitis or osteomyelitis.  Orthopedics consulted and s/p excisional debridement of abscess of right leg and local tissue rearrangement for wound closure 7/1.  Patient confirms that he had a tetanus shot in 2018.  Venous Doppler negative for DVT.  Blood cultures x2: Negative.  Preop wound culture: Few group A strep isolated and MSSA.  Surgical cultures also confirmed rare group A strep.  MRSA PCR negative.  Has completed 5 to  6 days of IV antibiotics.  Initially on vancomycin, then switched to ampicillin and finally to cefazolin per ID pharmacist recommendation.  As per ID pharmacist, preop wound cultures showing group A strep and MSSA, IV ampicillin switched to cefazolin and then p.o. Keflex at time of discharge to complete total 10 days.  Orthopedics MD has seen patient today and cleared him for discharge.  I discussed in detail with Dr. Sharol Given.  Although patient still has leukocytosis with WBC of 19 K, surgical wound looks good and he recommends discharge and will follow up in the office in a week's time.  He has kindly agreed also to have lab work done during that visit.  He recommends dressing  changes as noted above.  TOC team consulted for PCP needs, meds at discharge and DME.  Improved and stable.   Polysubstance abuse (alcohol, tobacco, THC): To admitting MD he reported that he has cut down alcohol use significantly since 2019.  Ongoing tobacco use and not considering quitting at this time despite cessation counseling.  Advised cessation of all substances.  Per RN, asking if he can go out to smoke.   Hypokalemia: Replaced.     Leukocytosis: Secondary to infectious etiology above.  WBC is back up to 19 K from 16 K.  Possibly reactive.  Right leg wound looks good, personally examined today.  No other clinical concern for infectious etiology.  Follow CBC as outpatient   Body mass index is 29.76 kg/m.         Consultants:   Orthopedics   Procedures:   s/p excisional debridement of abscess of right leg and local tissue rearrangement for wound closure 7/1.   Discharge Instructions  Discharge Instructions     Call MD for:  extreme fatigue   Complete by: As directed    Call MD for:  persistant dizziness or light-headedness   Complete by: As directed    Call MD for:  redness, tenderness, or signs of infection (pain, swelling, redness, odor or green/yellow discharge around incision site)   Complete by: As directed    Call MD for:  severe uncontrolled pain   Complete by: As directed    Call MD for:  temperature >100.4   Complete by: As directed    Diet general   Complete by: As directed    Discharge wound care:   Complete by: As directed    Daily wound care: Wash surgical incision site with soap and water, dab dry, cover with dry dressing gauze and wrap with Kerlix.  Keep dressing dry and clean.   Increase activity slowly   Complete by: As directed         Medication List     TAKE these medications    cephALEXin 500 MG capsule Commonly known as: KEFLEX Take 1 capsule (500 mg total) by mouth 4 (four) times daily for 5 days.   HYDROcodone-acetaminophen 5-325 MG  tablet Commonly known as: NORCO/VICODIN Take 1-2 tablets by mouth every 8 (eight) hours as needed for severe pain.   naproxen 500 MG tablet Commonly known as: NAPROSYN Take 1 tablet (500 mg total) by mouth 2 (two) times daily as needed for mild pain, headache or moderate pain.       No Known Allergies    Procedures/Studies: DG Tibia/Fibula Right  Result Date: 05/14/2021 CLINICAL DATA:  Right leg wound.  Infection. EXAM: RIGHT TIBIA AND FIBULA - 2 VIEW COMPARISON:  05/02/2020 FINDINGS: Soft tissue swelling anteriorly. No radiopaque foreign bodies or soft  tissue gas. No bone destruction or acute bony abnormality. IMPRESSION: Soft tissue swelling within the right calf. No acute bony abnormality. Electronically Signed   By: Rolm Baptise M.D.   On: 05/14/2021 23:30   MR TIBIA FIBULA RIGHT W WO CONTRAST  Result Date: 05/15/2021 CLINICAL DATA:  Acute onset right lower leg pain and swelling 3 days ago. Fever, chills and nausea. The patient reports a laceration of the right lower leg 6 weeks ago. EXAM: MRI OF LOWER RIGHT EXTREMITY WITHOUT AND WITH CONTRAST TECHNIQUE: Multiplanar, multisequence MR imaging of the right lower leg was performed both before and after administration of intravenous contrast. CONTRAST:  9 mL GADAVIST IV SOLN COMPARISON:  None. FINDINGS: Bones/Joint/Cartilage No bone marrow edema or enhancement to suggest osteomyelitis is identified. No fracture, stress change or worrisome lesion. Ligaments Negative. Muscles and Tendons No muscular edema or enhancement is identified. No intramuscular fluid collection is seen. No evidence of gas within muscle or tracking along fascial planes. Soft tissues There is diffuse and intense subcutaneous edema and enhancement consistent with cellulitis. There is focal fluid in subcutaneous fat measuring 2.5 cm AP x 0.6 cm transverse x 3 cm craniocaudal consistent with an abscess. This abscess is along the medial margin of the tibial diaphysis  approximately 20 cm above the plafond. No other abscess is identified. IMPRESSION: Right lower leg cellulitis with a 2.5 x 0.6 x 3 cm abscess in subcutaneous fatty tissues along the medial diaphysis of the tibia approximately 20 cm above the tibial plafond. Negative for myositis, fasciitis or osteomyelitis. Electronically Signed   By: Inge Rise M.D.   On: 05/15/2021 10:43   DG Chest Port 1 View  Result Date: 05/14/2021 CLINICAL DATA:  Questionable sepsis. EXAM: PORTABLE CHEST 1 VIEW COMPARISON:  October 23, 2008 FINDINGS: The heart size and mediastinal contours are within normal limits. Both lungs are clear. The visualized skeletal structures are unremarkable. IMPRESSION: No active disease. Electronically Signed   By: Virgina Norfolk M.D.   On: 05/14/2021 23:36   VAS [place] LOWER EXTREMITY VENOUS (DVT)  Result Date: 05/15/2021  Lower Venous DVT Study Patient Name:  Carder Basden  Date of Exam:   05/15/2021 Medical Rec #: [id number]          Accession #:    [id number] Date of Birth: Feb 18, 1991          Patient Gender: M Patient Age:   29Y Exam Location:  [facility] Procedure:      VAS [place] LOWER EXTREMITY VENOUS (DVT) Referring Phys: [id number] TIMOTHY S OPYD --------------------------------------------------------------------------------  Indications: Edema.  Comparison Study: no prior Performing Technologist: Archie Patten RVS  Examination Guidelines: A complete evaluation includes B-mode imaging, spectral Doppler, color Doppler, and power Doppler as needed of all accessible portions of each vessel. Bilateral testing is considered an integral part of a complete examination. Limited examinations for reoccurring indications may be performed as noted. The reflux portion of the exam is performed with the patient in reverse Trendelenburg.  +---------+---------------+---------+-----------+----------+--------------+ RIGHT    CompressibilityPhasicitySpontaneityPropertiesThrombus Aging  +---------+---------------+---------+-----------+----------+--------------+ CFV      Full           Yes      Yes                                 +---------+---------------+---------+-----------+----------+--------------+ SFJ      Full                                                        +---------+---------------+---------+-----------+----------+--------------+  FV Prox  Full                                                        +---------+---------------+---------+-----------+----------+--------------+ FV Mid   Full                                                        +---------+---------------+---------+-----------+----------+--------------+ FV DistalFull                                                        +---------+---------------+---------+-----------+----------+--------------+ PFV      Full                                                        +---------+---------------+---------+-----------+----------+--------------+ POP      Full           Yes      Yes                                 +---------+---------------+---------+-----------+----------+--------------+ PTV      Full                                                        +---------+---------------+---------+-----------+----------+--------------+ PERO     Full                                                        +---------+---------------+---------+-----------+----------+--------------+   +----+---------------+---------+-----------+----------+--------------+ LEFTCompressibilityPhasicitySpontaneityPropertiesThrombus Aging +----+---------------+---------+-----------+----------+--------------+ CFV Full           Yes      Yes                                 +----+---------------+---------+-----------+----------+--------------+     Summary: RIGHT: - There is no evidence of deep vein thrombosis in the lower extremity.  - No cystic structure found in the popliteal fossa.   LEFT: - No evidence of common femoral vein obstruction.  *See table(s) above for measurements and observations. Electronically signed by Servando Snare MD on 05/15/2021 at 6:29:49 PM.    Final       Subjective: Patient interviewed and examined along with his RN in the room.  Took down the right leg dressing and examined.  Postop pain slowly improving.  Able to ambulate in the room.  Denies any other complaints.  Eager to go home.  Discharge Exam:  Vitals:  05/19/21 0814 05/19/21 1546 05/19/21 2100 05/20/21 0749  BP: 113/61 132/81 122/69 (!) 115/45  Pulse: 72 87 78 73  Resp: _0 Temp: 97.7 F (36.5 C) 98.9 F (37.2 C) (!) 97.5 F (36.4 C) 97.9 F (36.6 C)  TempSrc: Oral Oral Oral Oral  SpO2: 99% 97% 97% 98%  Weight:      Height:        General exam: Young male, moderately built and nourished lying comfortably propped up in bed without distress. Respiratory system: Clear to auscultation. Respiratory effort normal. Cardiovascular system: S1 & S2 heard, RRR. No JVD, murmurs, rubs, gallops or clicks. No pedal edema.   Gastrointestinal system: Abdomen is nondistended, soft and nontender. No organomegaly or masses felt. Normal bowel sounds heard. Central nervous system: Alert and oriented. No focal neurological deficits. Extremities: Symmetric 5 x 5 power.  Took down the right leg postop dressing.  Surgical incision site looks good.  Sutures intact.  No drainage.  Faint and minimal redness-resolving from admission on the anterior shin.  Minimal swelling again much improved compared to admission.  No increased warmth.  Appropriate mild tenderness.  No crepitus.   Skin: As above Psychiatry: Judgement and insight appear normal. Mood & affect appropriate.    The results of significant diagnostics from this hospitalization (including imaging, microbiology, ancillary and laboratory) are listed below for reference.     Microbiology: Recent Results (from the past 240 hour(s))   Aerobic/Anaerobic Culture w Gram Stain (surgical/deep wound)     Status: None   Collection Time: 05/14/21  7:28 PM   Specimen: Wound  Result Value Ref Range Status   Specimen Description WOUND  Final   Special Requests RL  Final   Gram Stain   Final    MODERATE WBC PRESENT, PREDOMINANTLY PMN MODERATE GRAM POSITIVE COCCI IN PAIRS IN CHAINS    Culture   Final    FEW GROUP A STREP (S.PYOGENES) ISOLATED Beta hemolytic streptococci are predictably susceptible to penicillin and other beta lactams. Susceptibility testing not routinely performed. RARE STAPHYLOCOCCUS AUREUS NO ANAEROBES ISOLATED Performed at [facility], [place] [address] [place], [place] [address]    Report Status 05/19/2021 FINAL  Final   Organism ID, Bacteria STAPHYLOCOCCUS AUREUS  Final      Susceptibility   Staphylococcus aureus - MIC*    CIPROFLOXACIN <=0.5 SENSITIVE Sensitive     ERYTHROMYCIN <=0.25 SENSITIVE Sensitive     GENTAMICIN <=0.5 SENSITIVE Sensitive     OXACILLIN <=0.25 SENSITIVE Sensitive     TETRACYCLINE <=1 SENSITIVE Sensitive     VANCOMYCIN 1 SENSITIVE Sensitive     TRIMETH/SULFA <=10 SENSITIVE Sensitive     CLINDAMYCIN <=0.25 SENSITIVE Sensitive     RIFAMPIN <=0.5 SENSITIVE Sensitive     Inducible Clindamycin NEGATIVE Sensitive     * RARE STAPHYLOCOCCUS AUREUS  Blood culture (routine x 2)     Status: None   Collection Time: 05/14/21 11:15 PM   Specimen: BLOOD  Result Value Ref Range Status   Specimen Description BLOOD RIGHT ANTECUBITAL  Final   Special Requests   Final    BOTTLES DRAWN AEROBIC AND ANAEROBIC Blood Culture adequate volume   Culture   Final    NO GROWTH 5 DAYS Performed at [facility], [place] [address] [place], [place] [address]    Report Status 05/20/2021 FINAL  Final  Blood culture (routine x 2)     Status: None   Collection Time: 05/14/21 11:22 PM  Specimen: BLOOD RIGHT HAND  Result Value Ref Range Status   Specimen Description BLOOD RIGHT HAND  Final    Special Requests   Final    BOTTLES DRAWN AEROBIC AND ANAEROBIC Blood Culture results may not be optimal due to an inadequate volume of blood received in culture bottles   Culture   Final    NO GROWTH 5 DAYS Performed at [facility], [place] [address], [place], [place] [address]    Report Status 05/20/2021 FINAL  Final  Resp Panel by RT-PCR (Flu A&B, Covid) Nasopharyngeal Swab     Status: None   Collection Time: 05/14/21 11:55 PM   Specimen: Nasopharyngeal Swab; Nasopharyngeal(NP) swabs in vial transport medium  Result Value Ref Range Status   SARS Coronavirus 2 by RT PCR NEGATIVE NEGATIVE Final    Comment: (NOTE) SARS-CoV-2 target nucleic acids are NOT DETECTED.  The SARS-CoV-2 RNA is generally detectable in upper respiratory specimens during the acute phase of infection. The lowest concentration of SARS-CoV-2 viral copies this assay can detect is 138 copies/mL. A negative result does not preclude SARS-Cov-2 infection and should not be used as the sole basis for treatment or other patient management decisions. A negative result may occur with  improper specimen collection/handling, submission of specimen other than nasopharyngeal swab, presence of viral mutation(s) within the areas targeted by this assay, and inadequate number of viral copies(<138 copies/mL). A negative result must be combined with clinical observations, patient history, and epidemiological information. The expected result is Negative.  Fact Sheet for Patients:  EntrepreneurPulse.com.au  Fact Sheet for Healthcare Providers:  IncredibleEmployment.be  This test is no t yet approved or cleared by the [place] FDA and  has been authorized for detection and/or diagnosis of SARS-CoV-2 by FDA under an Emergency Use Authorization (EUA). This EUA will remain  in effect (meaning this test can be used) for the duration of the COVID-19 declaration under Section 564(b)(1) of the  Act, 21 U.S.C.section 360bbb-3(b)(1), unless the authorization is terminated  or revoked sooner.       Influenza A by PCR NEGATIVE NEGATIVE Final   Influenza B by PCR NEGATIVE NEGATIVE Final    Comment: (NOTE) The Xpert Xpress SARS-CoV-2/FLU/RSV plus assay is intended as an aid in the diagnosis of influenza from Nasopharyngeal swab specimens and should not be used as a sole basis for treatment. Nasal washings and aspirates are unacceptable for Xpert Xpress SARS-CoV-2/FLU/RSV testing.  Fact Sheet for Patients: EntrepreneurPulse.com.au  Fact Sheet for Healthcare Providers: IncredibleEmployment.be  This test is not yet approved or cleared by the [place] FDA and has been authorized for detection and/or diagnosis of SARS-CoV-2 by FDA under an Emergency Use Authorization (EUA). This EUA will remain in effect (meaning this test can be used) for the duration of the COVID-19 declaration under Section 564(b)(1) of the Act, 21 U.S.C. section 360bbb-3(b)(1), unless the authorization is terminated or revoked.  Performed at [facility], [place] [address] [place], [place] [address]   Urine culture     Status: None   Collection Time: 05/15/21  1:28 AM   Specimen: In/Out Cath Urine  Result Value Ref Range Status   Specimen Description IN/OUT CATH URINE  Final   Special Requests NONE  Final   Culture   Final    NO GROWTH Performed at [facility], [place] [address] [place], [place] [address]    Report Status 05/16/2021 FINAL  Final  Surgical pcr screen     Status:  None   Collection Time: 05/16/21  8:49 AM   Specimen: Nasal Mucosa; Nasal Swab  Result Value Ref Range Status   MRSA, PCR NEGATIVE NEGATIVE Final   Staphylococcus aureus NEGATIVE NEGATIVE Final    Comment: (NOTE) The Xpert SA Assay (FDA approved for NASAL specimens in patients 34 years of age and older), is one component of a comprehensive surveillance program. It is not  intended to diagnose infection nor to guide or monitor treatment. Performed at [facility], [place] [address] [place], [place] [address]   Aerobic/Anaerobic Culture w Gram Stain (surgical/deep wound)     Status: None (Preliminary result)   Collection Time: 05/16/21 11:46 AM   Specimen: Soft Tissue, Other  Result Value Ref Range Status   Specimen Description TISSUE RIGHT LEG SPEC A  Final   Special Requests RIGHT LEG  Final   Gram Stain   Final    FEW WBC PRESENT, PREDOMINANTLY PMN FEW GRAM POSITIVE COCCI IN PAIRS Performed at [facility], [place] [address], [place], [place] [address]    Culture   Final    RARE GROUP A STREP (S.PYOGENES) ISOLATED Beta hemolytic streptococci are predictably susceptible to penicillin and other beta lactams. Susceptibility testing not routinely performed. NO ANAEROBES ISOLATED; CULTURE IN PROGRESS FOR 5 DAYS    Report Status PENDING  Incomplete     Labs: CBC: Recent Labs  Lab 05/14/21 2020 05/15/21 0320 05/16/21 0527 05/17/21 0920 05/18/21 0611 05/20/21 0331  WBC 20.4* 19.9* 12.0* 19.0* 16.1* 19.4*  NEUTROABS 17.7*  --   --   --   --   --   HGB 15.5 13.8 17.1* 12.5* 12.8* 14.1  HCT 47.9 41.6 53.9* 36.7* 39.2 43.0  MCV 88.2 87.2 87.8 85.3 87.5 87.9  PLT 287 249 194 307 332 567    Basic Metabolic Panel: Recent Labs  Lab 05/14/21 2020 05/14/21 2339 05/15/21 0430 05/16/21 0527 05/17/21 0920  NA 138 135 136 137 136  K 4.1 3.5 3.3* 4.2 4.2  CL 101 101 103 106 103  CO2 _0 GLUCOSE 82 87 118* 95 129*  BUN _1 5*  CREATININE 0.91 0.89 0.81 0.70 0.72  CALCIUM 9.3 9.0 8.5* 8.1* 8.5*    Liver Function Tests: Recent Labs  Lab 05/14/21 2339  AST 15  ALT 13  ALKPHOS 73  BILITOT 0.6  PROT 6.7  ALBUMIN 3.5    Urinalysis    Component Value Date/Time   COLORURINE AMBER (A) 05/15/2021 0133   APPEARANCEUR HAZY (A) 05/15/2021 0133   LABSPEC 1.039 (H) 05/15/2021 0133   PHURINE 5.0 05/15/2021 0133    GLUCOSEU NEGATIVE 05/15/2021 0133   HGBUR NEGATIVE 05/15/2021 0133   BILIRUBINUR NEGATIVE 05/15/2021 0133   KETONESUR 5 (A) 05/15/2021 0133   PROTEINUR 30 (A) 05/15/2021 0133   NITRITE NEGATIVE 05/15/2021 0133   LEUKOCYTESUR NEGATIVE 05/15/2021 0133      Time coordinating discharge: 25 minutes  SIGNED:  Vernell Leep, MD, FACP, [facility]. [facility]  To contact the attending provider between 7A-7P or the covering provider during after hours 7P-7A, please log into the web site [email or web address] and access using universal [place] password for that web site. If you do not have the password, please call the hospital operator.

## 2021-05-20 NOTE — Telephone Encounter (Signed)
Called number for pt and his mother Victorino Dike answered. She states the pt is a "transit" and she's not sure where he is or when he'll be back. Victorino Dike states the pt doesn't have another number to try him at but she will let him know when she sees him and give him the number to call.

## 2021-05-20 NOTE — Progress Notes (Signed)
Patient dc'd, dc instructions provided. PIV removed by patient.  Dc instructions reviewed with patient; patient voiced understanding.  [facility] and walker from [employer].  Patient refused wc ride. No distress noted.

## 2021-05-20 NOTE — Telephone Encounter (Signed)
-----   Message from Rodena Medin, [place] sent at 05/20/2021  2:54 PM EDT ----- Regarding: can you please call for appt This pt is s/p an I&D RLE 05/16/21 and needs follow up. Can you please call to make an appt and also can you include in the appt visit reason that pt will need a CBC and Bmet drawn while in office per Dr. Montez Morita YOU!

## 2021-05-20 NOTE — Plan of Care (Signed)

## 2021-05-20 NOTE — Discharge Instructions (Signed)

## 2021-05-20 NOTE — Care Management (Signed)
Follow up appointment made and on AVS.   Will assist with scripts through [facility]   Patient asking for uber to [address] [place] . Will provide nurse with [employer] waiver form and number. Once walker delivered and medications delivered nurse can call transportation.

## 2021-05-20 NOTE — Progress Notes (Signed)
Patient ID: Mike Sellers, male   DOB: 1991-03-27, 30 y.o.   MRN: [id number] Patient is status post excisional debridement abscess right leg the drain has been removed cultures were positive for group A strep pansensitive.  Anticipate discharge to home today.

## 2021-05-21 LAB — AEROBIC/ANAEROBIC CULTURE W GRAM STAIN (SURGICAL/DEEP WOUND)

## 2021-05-30 ENCOUNTER — Other Ambulatory Visit (HOSPITAL_COMMUNITY): Payer: Self-pay

## 2021-07-01 ENCOUNTER — Inpatient Hospital Stay: Payer: Self-pay | Admitting: Family

## 2021-07-01 ENCOUNTER — Inpatient Hospital Stay: Payer: Self-pay | Admitting: Family Medicine

## 2022-01-31 ENCOUNTER — Ambulatory Visit (HOSPITAL_COMMUNITY): Admission: EM | Admit: 2022-01-31 | Discharge: 2022-01-31 | Discharge status: Against Medical Advice | Payer: Self-pay

## 2022-04-22 IMAGING — DX DG ANKLE COMPLETE 3+V*R*
3 series · 3 of 3 positions shown · non-contrast
Comparison: Plain films right ankle 12/30/2006.

CLINICAL DATA: Patient status fresh that right ankle pain due to an
injury suffered in a fall yesterday. Initial encounter.

EXAM:
RIGHT ANKLE - COMPLETE 3+ VIEW

[x ankle ap right]
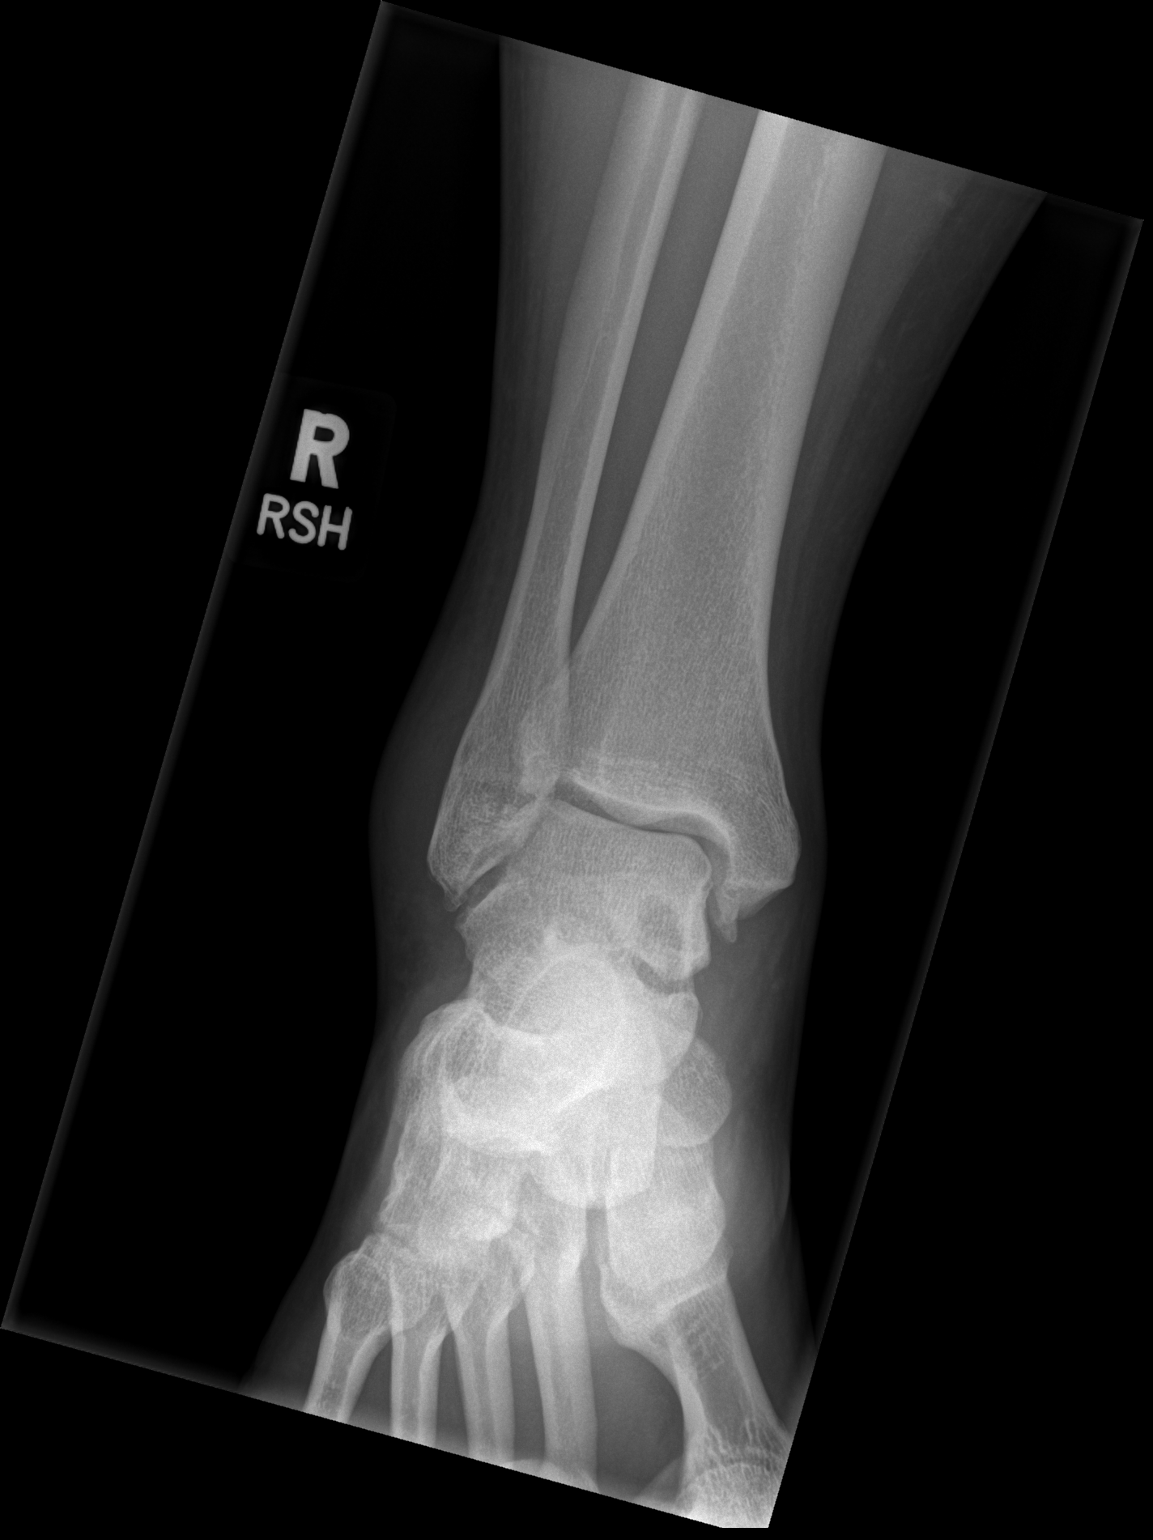

[x ankle obl right]
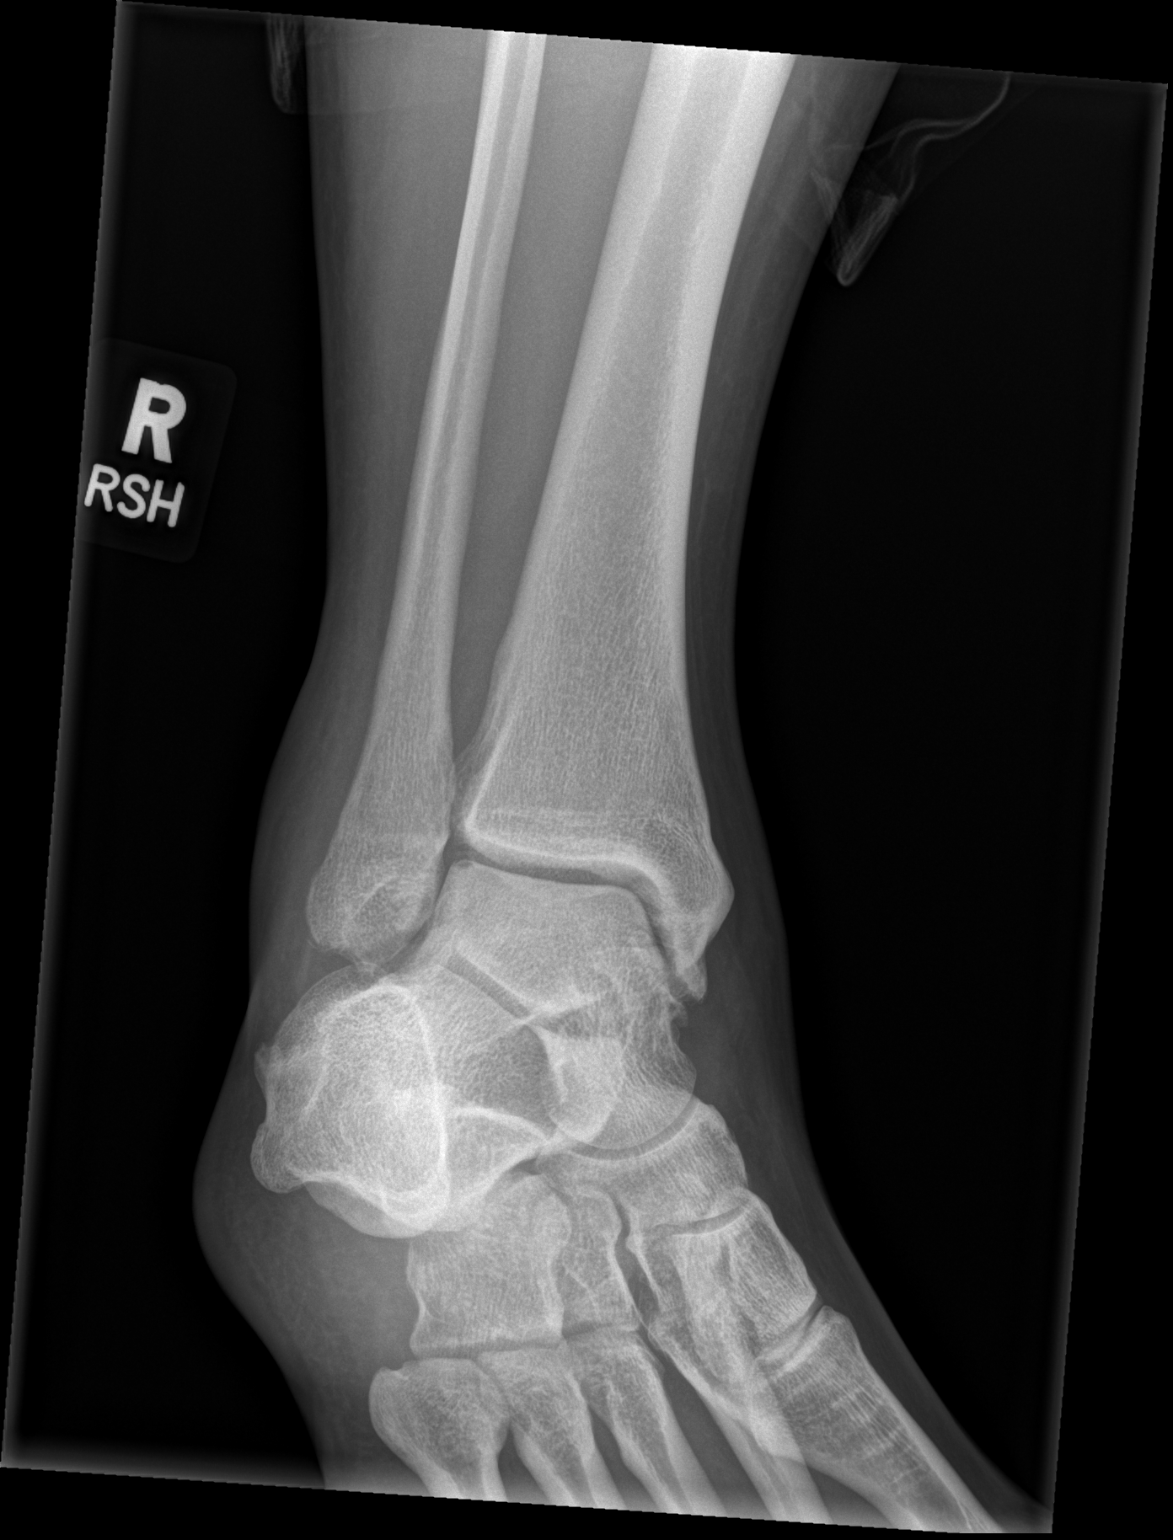

[x ankle lat right]
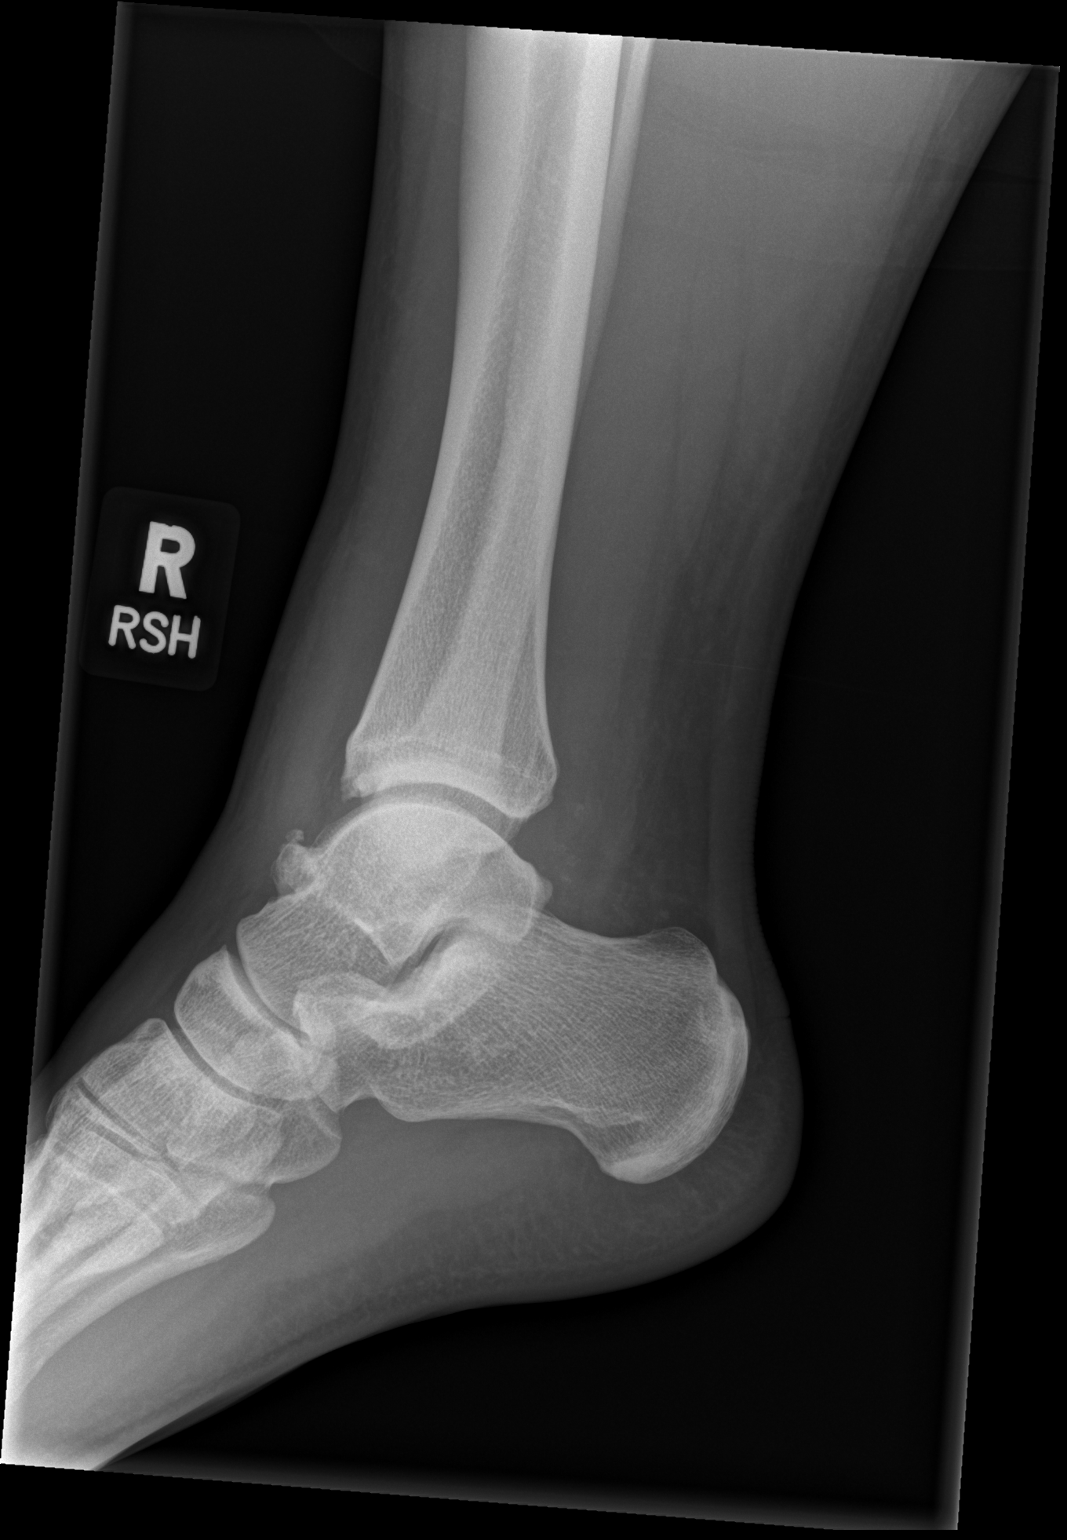

[3 of 3 positions shown; findings below may reference images not displayed]

FINDINGS: There is soft tissue swelling about the ankle which is worse on the
lateral side. Osteophytosis is seen off both the medial and lateral
malleoli. A 0.4 cm lucency in the lateral talar dome is consistent
with an osteochondral defect and new since the prior examination.
Exostosis off the dorsal neck of the talus is increased in size
since the prior study.
IMPRESSION: Soft tissue swelling about the ankle.  Negative for fracture.

Small osteochondral lesion lateral talar dome is age indeterminate
and new since the prior examination.

Age advanced tibiotalar osteoarthritis. Cause for this finding is
not identified.

Exostosis off the dorsal neck of the talus is increased in size
since [DATE].

## 2022-05-04 IMAGING — DX DG CHEST 1V PORT
1 series · 1 of 1 positions shown · non-contrast
Comparison: October 23, 2008

CLINICAL DATA: Questionable sepsis.

EXAM:
PORTABLE CHEST 1 VIEW

[chest]
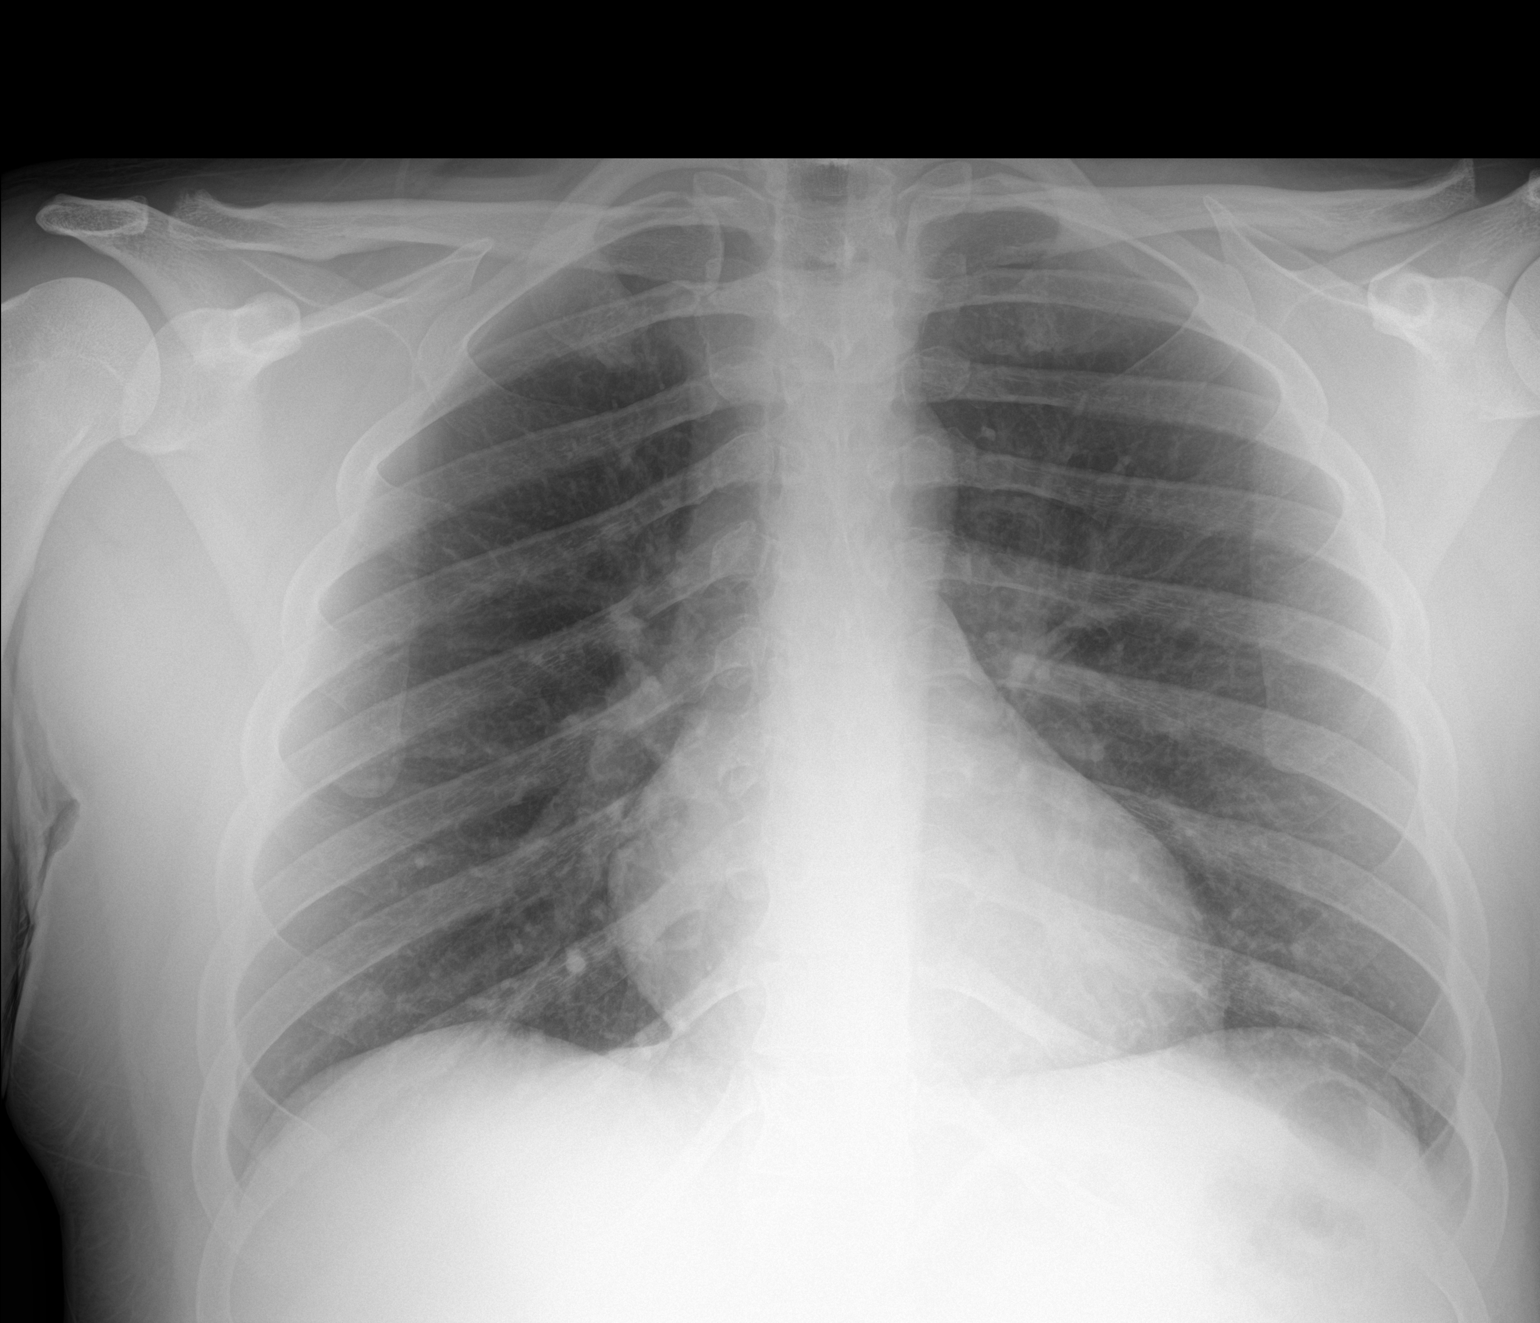

[1 of 1 positions shown; findings below may reference images not displayed]

FINDINGS: The heart size and mediastinal contours are within normal limits.
Both lungs are clear. The visualized skeletal structures are
unremarkable.
IMPRESSION: No active disease.

## 2024-06-29 ENCOUNTER — Inpatient Hospital Stay (HOSPITAL_COMMUNITY): Payer: No Typology Code available for payment source

## 2024-06-29 ENCOUNTER — Encounter (HOSPITAL_COMMUNITY): Payer: Self-pay

## 2024-06-29 ENCOUNTER — Emergency Department (HOSPITAL_COMMUNITY): Payer: No Typology Code available for payment source

## 2024-06-29 ENCOUNTER — Other Ambulatory Visit: Payer: Self-pay

## 2024-06-29 ENCOUNTER — Inpatient Hospital Stay (HOSPITAL_COMMUNITY)
Admission: EM | Admit: 2024-06-29 | Discharge: 2024-07-17 | DRG: 917 | Disposition: E | Payer: No Typology Code available for payment source | Attending: Critical Care Medicine | Admitting: Critical Care Medicine

## 2024-06-29 DIAGNOSIS — T43621A Poisoning by amphetamines, accidental (unintentional), initial encounter: Principal | ICD-10-CM | POA: Diagnosis present

## 2024-06-29 DIAGNOSIS — E162 Hypoglycemia, unspecified: Secondary | ICD-10-CM | POA: Diagnosis not present

## 2024-06-29 DIAGNOSIS — A419 Sepsis, unspecified organism: Secondary | ICD-10-CM | POA: Diagnosis present

## 2024-06-29 DIAGNOSIS — Z5900 Homelessness unspecified: Secondary | ICD-10-CM | POA: Diagnosis not present

## 2024-06-29 DIAGNOSIS — D72829 Elevated white blood cell count, unspecified: Secondary | ICD-10-CM

## 2024-06-29 DIAGNOSIS — Z515 Encounter for palliative care: Secondary | ICD-10-CM

## 2024-06-29 DIAGNOSIS — I21A1 Myocardial infarction type 2: Secondary | ICD-10-CM | POA: Diagnosis present

## 2024-06-29 DIAGNOSIS — N170 Acute kidney failure with tubular necrosis: Secondary | ICD-10-CM | POA: Diagnosis present

## 2024-06-29 DIAGNOSIS — J9602 Acute respiratory failure with hypercapnia: Secondary | ICD-10-CM | POA: Diagnosis present

## 2024-06-29 DIAGNOSIS — R569 Unspecified convulsions: Secondary | ICD-10-CM | POA: Diagnosis present

## 2024-06-29 DIAGNOSIS — G931 Anoxic brain damage, not elsewhere classified: Secondary | ICD-10-CM | POA: Diagnosis present

## 2024-06-29 DIAGNOSIS — Z1152 Encounter for screening for COVID-19: Secondary | ICD-10-CM | POA: Diagnosis not present

## 2024-06-29 DIAGNOSIS — J9601 Acute respiratory failure with hypoxia: Secondary | ICD-10-CM | POA: Diagnosis present

## 2024-06-29 DIAGNOSIS — Z79899 Other long term (current) drug therapy: Secondary | ICD-10-CM

## 2024-06-29 DIAGNOSIS — F1510 Other stimulant abuse, uncomplicated: Secondary | ICD-10-CM | POA: Diagnosis present

## 2024-06-29 DIAGNOSIS — Z66 Do not resuscitate: Secondary | ICD-10-CM | POA: Diagnosis present

## 2024-06-29 DIAGNOSIS — G253 Myoclonus: Secondary | ICD-10-CM | POA: Diagnosis present

## 2024-06-29 DIAGNOSIS — I3139 Other pericardial effusion (noninflammatory): Secondary | ICD-10-CM | POA: Diagnosis present

## 2024-06-29 DIAGNOSIS — K7200 Acute and subacute hepatic failure without coma: Secondary | ICD-10-CM | POA: Diagnosis present

## 2024-06-29 DIAGNOSIS — N179 Acute kidney failure, unspecified: Secondary | ICD-10-CM | POA: Diagnosis not present

## 2024-06-29 DIAGNOSIS — I468 Cardiac arrest due to other underlying condition: Secondary | ICD-10-CM | POA: Diagnosis present

## 2024-06-29 DIAGNOSIS — G9382 Brain death: Secondary | ICD-10-CM | POA: Diagnosis not present

## 2024-06-29 DIAGNOSIS — E876 Hypokalemia: Secondary | ICD-10-CM | POA: Diagnosis present

## 2024-06-29 DIAGNOSIS — E874 Mixed disorder of acid-base balance: Secondary | ICD-10-CM | POA: Diagnosis present

## 2024-06-29 DIAGNOSIS — T50901A Poisoning by unspecified drugs, medicaments and biological substances, accidental (unintentional), initial encounter: Secondary | ICD-10-CM | POA: Diagnosis present

## 2024-06-29 DIAGNOSIS — Y92241 Library as the place of occurrence of the external cause: Secondary | ICD-10-CM | POA: Diagnosis not present

## 2024-06-29 DIAGNOSIS — E669 Obesity, unspecified: Secondary | ICD-10-CM | POA: Diagnosis present

## 2024-06-29 DIAGNOSIS — R579 Shock, unspecified: Secondary | ICD-10-CM

## 2024-06-29 DIAGNOSIS — J690 Pneumonitis due to inhalation of food and vomit: Secondary | ICD-10-CM | POA: Diagnosis present

## 2024-06-29 DIAGNOSIS — E872 Acidosis, unspecified: Secondary | ICD-10-CM

## 2024-06-29 DIAGNOSIS — Z539 Procedure and treatment not carried out, unspecified reason: Secondary | ICD-10-CM | POA: Diagnosis not present

## 2024-06-29 DIAGNOSIS — D6489 Other specified anemias: Secondary | ICD-10-CM | POA: Diagnosis present

## 2024-06-29 DIAGNOSIS — R6521 Severe sepsis with septic shock: Secondary | ICD-10-CM | POA: Diagnosis present

## 2024-06-29 DIAGNOSIS — I6782 Cerebral ischemia: Secondary | ICD-10-CM | POA: Diagnosis not present

## 2024-06-29 DIAGNOSIS — I4510 Unspecified right bundle-branch block: Secondary | ICD-10-CM | POA: Diagnosis present

## 2024-06-29 DIAGNOSIS — G9340 Encephalopathy, unspecified: Secondary | ICD-10-CM | POA: Diagnosis not present

## 2024-06-29 DIAGNOSIS — R7401 Elevation of levels of liver transaminase levels: Secondary | ICD-10-CM | POA: Diagnosis not present

## 2024-06-29 DIAGNOSIS — R570 Cardiogenic shock: Secondary | ICD-10-CM | POA: Diagnosis present

## 2024-06-29 DIAGNOSIS — R578 Other shock: Secondary | ICD-10-CM | POA: Diagnosis present

## 2024-06-29 DIAGNOSIS — I469 Cardiac arrest, cause unspecified: Principal | ICD-10-CM | POA: Diagnosis present

## 2024-06-29 DIAGNOSIS — R1110 Vomiting, unspecified: Secondary | ICD-10-CM | POA: Diagnosis present

## 2024-06-29 DIAGNOSIS — Z6838 Body mass index (BMI) 38.0-38.9, adult: Secondary | ICD-10-CM

## 2024-06-29 HISTORY — DX: Other psychoactive substance abuse, uncomplicated: F19.10

## 2024-06-29 LAB — POCT I-STAT 7, (LYTES, BLD GAS, ICA,H+H)
Acid-base deficit: 10 mmol/L — ABNORMAL HIGH (ref 0.0–2.0)
Acid-base deficit: 4 mmol/L — ABNORMAL HIGH (ref 0.0–2.0)
Bicarbonate: 19.5 mmol/L — ABNORMAL LOW (ref 20.0–28.0)
Bicarbonate: 22.4 mmol/L (ref 20.0–28.0)
Calcium, Ion: 1.08 mmol/L — ABNORMAL LOW (ref 1.15–1.40)
Calcium, Ion: 1.03 mmol/L — ABNORMAL LOW (ref 1.15–1.40)
HCT: 52 % (ref 39.0–52.0)
HCT: 50 % (ref 39.0–52.0)
Hemoglobin: 17.7 g/dL — ABNORMAL HIGH (ref 13.0–17.0)
Hemoglobin: 17 g/dL (ref 13.0–17.0)
O2 Saturation: 88 %
O2 Saturation: 98 %
Patient temperature: 35.3
Patient temperature: 38.3
Potassium: 6.8 mmol/L (ref 3.5–5.1)
Potassium: 4.7 mmol/L (ref 3.5–5.1)
Sodium: 141 mmol/L (ref 135–145)
Sodium: 143 mmol/L (ref 135–145)
TCO2: 21 mmol/L — ABNORMAL LOW (ref 22–32)
TCO2: 24 mmol/L (ref 22–32)
pCO2 arterial: 50.9 mmHg — ABNORMAL HIGH (ref 32–48)
pCO2 arterial: 47.4 mmHg (ref 32–48)
pH, Arterial: 7.18 — CL (ref 7.35–7.45)
pH, Arterial: 7.29 — ABNORMAL LOW (ref 7.35–7.45)
pO2, Arterial: 63 mmHg — ABNORMAL LOW (ref 83–108)
pO2, Arterial: 118 mmHg — ABNORMAL HIGH (ref 83–108)

## 2024-06-29 LAB — TRIGLYCERIDES: Triglycerides: 78 mg/dL (ref <150)

## 2024-06-29 LAB — I-STAT CHEM 8, ED
BUN: 14 mg/dL (ref 8–23)
Calcium, Ion: 1.01 mmol/L — ABNORMAL LOW (ref 1.15–1.40)
Chloride: 109 mmol/L (ref 98–111)
Creatinine, Ser: 2.3 mg/dL — ABNORMAL HIGH (ref 0.61–1.24)
Glucose, Bld: 121 mg/dL — ABNORMAL HIGH (ref 70–99)
HCT: 43 % (ref 39.0–52.0)
Hemoglobin: 14.6 g/dL (ref 13.0–17.0)
Potassium: 3.7 mmol/L (ref 3.5–5.1)
Sodium: 144 mmol/L (ref 135–145)
TCO2: 16 mmol/L — ABNORMAL LOW (ref 22–32)

## 2024-06-29 LAB — URINALYSIS, W/ REFLEX TO CULTURE (INFECTION SUSPECTED)
Bilirubin Urine: NEGATIVE
Glucose, UA: 50 mg/dL — AB
Ketones, ur: NEGATIVE mg/dL
Leukocytes,Ua: NEGATIVE
Nitrite: NEGATIVE
Protein, ur: 100 mg/dL — AB
Specific Gravity, Urine: 1.01 (ref 1.005–1.030)
pH: 6 (ref 5.0–8.0)

## 2024-06-29 LAB — I-STAT ARTERIAL BLOOD GAS, ED
Acid-base deficit: 17 mmol/L — ABNORMAL HIGH (ref 0.0–2.0)
Bicarbonate: 15.1 mmol/L — ABNORMAL LOW (ref 20.0–28.0)
Calcium, Ion: 1.08 mmol/L — ABNORMAL LOW (ref 1.15–1.40)
HCT: 46 % (ref 39.0–52.0)
Hemoglobin: 15.6 g/dL (ref 13.0–17.0)
O2 Saturation: 100 %
Patient temperature: 91.5
Potassium: 4.2 mmol/L (ref 3.5–5.1)
Sodium: 145 mmol/L (ref 135–145)
TCO2: 17 mmol/L — ABNORMAL LOW (ref 22–32)
pCO2 arterial: 53.1 mmHg — ABNORMAL HIGH (ref 32–48)
pH, Arterial: 7.035 — CL (ref 7.35–7.45)
pO2, Arterial: 340 mmHg — ABNORMAL HIGH (ref 83–108)

## 2024-06-29 LAB — RAPID URINE DRUG SCREEN, HOSP PERFORMED
Amphetamines: POSITIVE — AB
Barbiturates: NOT DETECTED
Benzodiazepines: NOT DETECTED
Cocaine: NOT DETECTED
Opiates: NOT DETECTED
Tetrahydrocannabinol: NOT DETECTED

## 2024-06-29 LAB — CBC
HCT: 49.2 % (ref 39.0–52.0)
Hemoglobin: 14.7 g/dL (ref 13.0–17.0)
MCH: 28.7 pg (ref 26.0–34.0)
MCHC: 29.9 g/dL — ABNORMAL LOW (ref 30.0–36.0)
MCV: 96.1 fL (ref 80.0–100.0)
Platelets: 231 K/uL (ref 150–400)
RBC: 5.12 MIL/uL (ref 4.22–5.81)
RDW: 13.1 % (ref 11.5–15.5)
WBC: 25.8 K/uL — ABNORMAL HIGH (ref 4.0–10.5)
nRBC: 0.1 % (ref 0.0–0.2)

## 2024-06-29 LAB — APTT: aPTT: 42 s — ABNORMAL HIGH (ref 24–36)

## 2024-06-29 LAB — PROTIME-INR
INR: 1.4 — ABNORMAL HIGH (ref 0.8–1.2)
Prothrombin Time: 18.2 s — ABNORMAL HIGH (ref 11.4–15.2)

## 2024-06-29 LAB — CG4 I-STAT (LACTIC ACID)
Lactic Acid, Venous: 3.7 mmol/L (ref 0.5–1.9)
Lactic Acid, Venous: 2.9 mmol/L (ref 0.5–1.9)

## 2024-06-29 LAB — GLUCOSE, CAPILLARY
Glucose-Capillary: 117 mg/dL — ABNORMAL HIGH (ref 70–99)
Glucose-Capillary: 119 mg/dL — ABNORMAL HIGH (ref 70–99)
Glucose-Capillary: 142 mg/dL — ABNORMAL HIGH (ref 70–99)
Glucose-Capillary: 60 mg/dL — ABNORMAL LOW (ref 70–99)
Glucose-Capillary: 62 mg/dL — ABNORMAL LOW (ref 70–99)
Glucose-Capillary: 90 mg/dL (ref 70–99)

## 2024-06-29 LAB — ACETAMINOPHEN LEVEL: Acetaminophen (Tylenol), Serum: <10 ug/mL — ABNORMAL LOW (ref 10–30)

## 2024-06-29 LAB — ABO/RH: ABO/RH(D): O POS

## 2024-06-29 LAB — CBG MONITORING, ED: Glucose-Capillary: 133 mg/dL — ABNORMAL HIGH (ref 70–99)

## 2024-06-29 LAB — TROPONIN I (HIGH SENSITIVITY): Troponin I (High Sensitivity): 110 ng/L (ref <18)

## 2024-06-29 LAB — I-STAT CG4 LACTIC ACID, ED: Lactic Acid, Venous: 13.4 mmol/L (ref 0.5–1.9)

## 2024-06-29 LAB — SALICYLATE LEVEL: Salicylate Lvl: <7 mg/dL — ABNORMAL LOW (ref 7.0–30.0)

## 2024-06-29 LAB — ETHANOL: Alcohol, Ethyl (B): <15 mg/dL (ref <15)

## 2024-06-29 LAB — PHOSPHORUS: Phosphorus: >30 mg/dL — ABNORMAL HIGH (ref 2.5–4.6)

## 2024-06-29 LAB — MRSA NEXT GEN BY PCR, NASAL: MRSA by PCR Next Gen: NOT DETECTED

## 2024-06-29 LAB — MAGNESIUM: Magnesium: 3 mg/dL — ABNORMAL HIGH (ref 1.7–2.4)

## 2024-06-29 MED ORDER — BUSPIRONE HCL 10 MG PO TABS
30.0000 mg | ORAL_TABLET | Freq: Three times a day (TID) | ORAL | Status: AC | PRN
  Filled 2024-06-29: qty 3

## 2024-06-29 MED ORDER — MIDAZOLAM HCL 2 MG/2ML IJ SOLN
1.0000 mg | INTRAMUSCULAR | Status: DC | PRN
  Administered 2024-06-29 – 2024-07-05 (×9): 2 mg via INTRAVENOUS
  Filled 2024-06-29 (×15): qty 2

## 2024-06-29 MED ORDER — POLYETHYLENE GLYCOL 3350 17 G PO PACK: 17.0000 g | PACK | Freq: Every day | ORAL | Status: DC | PRN

## 2024-06-29 MED ORDER — HEPARIN SODIUM (PORCINE) 5000 UNIT/ML IJ SOLN
5000.0000 [IU] | Freq: Three times a day (TID) | INTRAMUSCULAR | Status: DC
  Administered 2024-06-29 – 2024-06-30 (×3): 5000 [IU] via SUBCUTANEOUS
  Filled 2024-06-29 (×3): qty 1

## 2024-06-29 MED ORDER — SODIUM CHLORIDE 0.9 % IV SOLN
3.0000 g | Freq: Four times a day (QID) | INTRAVENOUS | Status: DC
  Administered 2024-06-29 – 2024-07-01 (×7): 3 g via INTRAVENOUS
  Filled 2024-06-29 (×7): qty 8

## 2024-06-29 MED ORDER — SODIUM ZIRCONIUM CYCLOSILICATE 10 G PO PACK
10.0000 g | PACK | Freq: Three times a day (TID) | ORAL | Status: DC
  Administered 2024-06-29 (×2): 10 g
  Filled 2024-06-29 (×2): qty 1

## 2024-06-29 MED ORDER — LEVETIRACETAM (KEPPRA) 500 MG/5 ML ADULT IV PUSH
2500.0000 mg | Freq: Once | INTRAVENOUS | Status: AC
  Administered 2024-06-29: 2500 mg via INTRAVENOUS
  Filled 2024-06-29: qty 25

## 2024-06-29 MED ORDER — ETOMIDATE 2 MG/ML IV SOLN
INTRAVENOUS | Status: DC | PRN
  Administered 2024-06-29: 20 mg via INTRAVENOUS

## 2024-06-29 MED ORDER — DOCUSATE SODIUM 50 MG/5ML PO LIQD: 100.0000 mg | Freq: Two times a day (BID) | ORAL | Status: DC

## 2024-06-29 MED ORDER — ACETAMINOPHEN 325 MG PO TABS
650.0000 mg | ORAL_TABLET | ORAL | Status: AC
  Administered 2024-06-29 – 2024-06-30 (×4): 650 mg
  Filled 2024-06-29 (×5): qty 2

## 2024-06-29 MED ORDER — ACETAMINOPHEN 650 MG RE SUPP: 650.0000 mg | RECTAL | Status: DC | PRN

## 2024-06-29 MED ORDER — ALBUTEROL SULFATE (2.5 MG/3ML) 0.083% IN NEBU: 2.5000 mg | INHALATION_SOLUTION | RESPIRATORY_TRACT | Status: DC | PRN

## 2024-06-29 MED ORDER — LEVETIRACETAM (KEPPRA) 500 MG/5 ML ADULT IV PUSH
500.0000 mg | Freq: Two times a day (BID) | INTRAVENOUS | Status: DC
  Administered 2024-06-29 – 2024-07-06 (×14): 500 mg via INTRAVENOUS
  Filled 2024-06-29 (×14): qty 5

## 2024-06-29 MED ORDER — NOREPINEPHRINE 4 MG/250ML-% IV SOLN
0.0000 ug/min | INTRAVENOUS | Status: DC
  Administered 2024-06-29: 2 ug/min via INTRAVENOUS
  Administered 2024-06-29: 10 ug/min via INTRAVENOUS
  Administered 2024-06-30: 1 ug/min via INTRAVENOUS
  Filled 2024-06-29 (×4): qty 250

## 2024-06-29 MED ORDER — FENTANYL CITRATE PF 50 MCG/ML IJ SOSY
50.0000 ug | PREFILLED_SYRINGE | INTRAMUSCULAR | Status: AC | PRN
  Administered 2024-06-29 (×3): 50 ug via INTRAVENOUS
  Filled 2024-06-29: qty 1

## 2024-06-29 MED ORDER — MIDAZOLAM HCL 2 MG/2ML IJ SOLN: 10.0000 mg | Freq: Once | INTRAMUSCULAR | Status: DC | PRN

## 2024-06-29 MED ORDER — ACETAMINOPHEN 325 MG PO TABS: 650.0000 mg | ORAL_TABLET | ORAL | Status: DC | PRN

## 2024-06-29 MED ORDER — CHLORHEXIDINE GLUCONATE CLOTH 2 % EX PADS
6.0000 | MEDICATED_PAD | Freq: Every day | CUTANEOUS | Status: DC
  Administered 2024-06-29 – 2024-07-06 (×8): 6 via TOPICAL

## 2024-06-29 MED ORDER — NOREPINEPHRINE 4 MG/250ML-% IV SOLN: 2.0000 ug/min | INTRAVENOUS | Status: DC

## 2024-06-29 MED ORDER — ONDANSETRON HCL 4 MG/2ML IJ SOLN
4.0000 mg | Freq: Four times a day (QID) | INTRAMUSCULAR | Status: DC | PRN
  Administered 2024-07-03: 4 mg via INTRAVENOUS
  Filled 2024-06-29: qty 2

## 2024-06-29 MED ORDER — PROPOFOL 1000 MG/100ML IV EMUL
0.0000 ug/kg/min | INTRAVENOUS | Status: DC
  Filled 2024-06-29: qty 100

## 2024-06-29 MED ORDER — PANTOPRAZOLE SODIUM 40 MG IV SOLR
40.0000 mg | Freq: Two times a day (BID) | INTRAVENOUS | Status: DC
  Administered 2024-06-29 – 2024-07-02 (×7): 40 mg via INTRAVENOUS
  Filled 2024-06-29 (×7): qty 10

## 2024-06-29 MED ORDER — SODIUM BICARBONATE 8.4 % IV SOLN: 50.0000 meq | Freq: Once | INTRAVENOUS | Status: AC

## 2024-06-29 MED ORDER — POLYETHYLENE GLYCOL 3350 17 G PO PACK
17.0000 g | PACK | Freq: Every day | ORAL | Status: DC
  Administered 2024-06-30 – 2024-07-04 (×5): 17 g
  Filled 2024-06-29 (×6): qty 1

## 2024-06-29 MED ORDER — SODIUM BICARBONATE 8.4 % IV SOLN
INTRAVENOUS | Status: AC
  Administered 2024-06-29: 50 meq via INTRAVENOUS
  Filled 2024-06-29: qty 50

## 2024-06-29 MED ORDER — ACETAMINOPHEN 650 MG RE SUPP: 650.0000 mg | RECTAL | Status: AC

## 2024-06-29 MED ORDER — ROCURONIUM BROMIDE 10 MG/ML (PF) SYRINGE
PREFILLED_SYRINGE | INTRAVENOUS | Status: DC | PRN
  Administered 2024-06-29: 100 mg via INTRAVENOUS

## 2024-06-29 MED ORDER — DOCUSATE SODIUM 100 MG PO CAPS: 100.0000 mg | ORAL_CAPSULE | Freq: Two times a day (BID) | ORAL | Status: DC | PRN

## 2024-06-29 MED ORDER — CALCIUM GLUCONATE-NACL 2-0.675 GM/100ML-% IV SOLN
2.0000 g | Freq: Once | INTRAVENOUS | Status: AC
  Administered 2024-06-29: 2000 mg via INTRAVENOUS
  Filled 2024-06-29: qty 100

## 2024-06-29 MED ORDER — THIAMINE MONONITRATE 100 MG PO TABS
100.0000 mg | ORAL_TABLET | Freq: Every day | ORAL | Status: DC
  Administered 2024-06-29 – 2024-07-06 (×8): 100 mg
  Filled 2024-06-29 (×8): qty 1

## 2024-06-29 MED ORDER — POLYETHYLENE GLYCOL 3350 17 G PO PACK: 17.0000 g | PACK | Freq: Every day | ORAL | Status: DC

## 2024-06-29 MED ORDER — DEXTROSE 50 % IV SOLN
25.0000 mL | Freq: Once | INTRAVENOUS | Status: AC
  Administered 2024-06-29: 25 mL via INTRAVENOUS

## 2024-06-29 MED ORDER — ORAL CARE MOUTH RINSE
15.0000 mL | OROMUCOSAL | Status: DC
  Administered 2024-06-29 – 2024-07-06 (×84): 15 mL via OROMUCOSAL

## 2024-06-29 MED ORDER — DEXTROSE IN LACTATED RINGERS 5 % IV SOLN: INTRAVENOUS | Status: DC

## 2024-06-29 MED ORDER — FENTANYL CITRATE PF 50 MCG/ML IJ SOSY
50.0000 ug | PREFILLED_SYRINGE | INTRAMUSCULAR | Status: DC | PRN
  Filled 2024-06-29: qty 1
  Filled 2024-06-29: qty 2

## 2024-06-29 MED ORDER — FENTANYL CITRATE PF 50 MCG/ML IJ SOSY
50.0000 ug | PREFILLED_SYRINGE | Freq: Once | INTRAMUSCULAR | Status: AC
  Administered 2024-06-29: 50 ug via INTRAVENOUS

## 2024-06-29 MED ORDER — MAGNESIUM SULFATE 2 GM/50ML IV SOLN: 2.0000 g | Freq: Once | INTRAVENOUS | Status: AC | PRN

## 2024-06-29 MED ORDER — SODIUM CHLORIDE 0.9 % IV SOLN: 250.0000 mL | INTRAVENOUS | Status: AC

## 2024-06-29 MED ORDER — ACETAMINOPHEN 160 MG/5ML PO SOLN
650.0000 mg | ORAL | Status: AC
  Administered 2024-06-30 – 2024-07-01 (×5): 650 mg
  Filled 2024-06-29 (×5): qty 20.3

## 2024-06-29 MED ORDER — LACTATED RINGERS IV BOLUS
1000.0000 mL | Freq: Once | INTRAVENOUS | Status: AC
  Administered 2024-06-29: 1000 mL via INTRAVENOUS

## 2024-06-29 MED ORDER — SODIUM BICARBONATE 8.4 % IV SOLN: 100.0000 meq | Freq: Once | INTRAVENOUS | Status: AC

## 2024-06-29 MED ORDER — ORAL CARE MOUTH RINSE: 15.0000 mL | OROMUCOSAL | Status: DC | PRN

## 2024-06-29 MED ORDER — CALCIUM GLUCONATE-NACL 1-0.675 GM/50ML-% IV SOLN
1.0000 g | Freq: Once | INTRAVENOUS | Status: AC
  Administered 2024-06-29: 1000 mg via INTRAVENOUS
  Filled 2024-06-29: qty 50

## 2024-06-29 MED ORDER — FENTANYL BOLUS VIA INFUSION
50.0000 ug | INTRAVENOUS | Status: DC | PRN
  Administered 2024-06-29: 100 ug via INTRAVENOUS
  Administered 2024-06-29: 50 ug via INTRAVENOUS
  Administered 2024-07-01 – 2024-07-06 (×7): 100 ug via INTRAVENOUS

## 2024-06-29 MED ORDER — SODIUM BICARBONATE 8.4 % IV SOLN
100.0000 meq | Freq: Once | INTRAVENOUS | Status: AC
  Administered 2024-06-29: 100 meq via INTRAVENOUS
  Filled 2024-06-29: qty 100

## 2024-06-29 MED ORDER — INSULIN ASPART 100 UNIT/ML IJ SOLN
0.0000 [IU] | INTRAMUSCULAR | Status: DC
  Administered 2024-06-29 – 2024-07-02 (×2): 1 [IU] via SUBCUTANEOUS

## 2024-06-29 MED ORDER — ACETAMINOPHEN 160 MG/5ML PO SOLN: 650.0000 mg | ORAL | Status: DC | PRN

## 2024-06-29 MED ORDER — DOCUSATE SODIUM 50 MG/5ML PO LIQD
100.0000 mg | Freq: Two times a day (BID) | ORAL | Status: DC
  Administered 2024-06-29 – 2024-07-04 (×10): 100 mg
  Filled 2024-06-29 (×12): qty 10

## 2024-06-29 MED ORDER — PROPOFOL 1000 MG/100ML IV EMUL
0.0000 ug/kg/min | INTRAVENOUS | Status: DC
  Administered 2024-06-29: 5 ug/kg/min via INTRAVENOUS
  Administered 2024-06-30 – 2024-07-02 (×16): 40 ug/kg/min via INTRAVENOUS
  Administered 2024-07-04: 5 ug/kg/min via INTRAVENOUS
  Administered 2024-07-04 (×2): 25 ug/kg/min via INTRAVENOUS
  Administered 2024-07-05: 30 ug/kg/min via INTRAVENOUS
  Filled 2024-06-29 (×23): qty 100

## 2024-06-29 MED ORDER — FENTANYL 2500MCG IN NS 250ML (10MCG/ML) PREMIX INFUSION
0.0000 ug/h | INTRAVENOUS | Status: DC
  Administered 2024-06-29: 50 ug/h via INTRAVENOUS
  Administered 2024-06-30 – 2024-07-06 (×8): 125 ug/h via INTRAVENOUS
  Filled 2024-06-29 (×9): qty 250

## 2024-06-29 MED ORDER — BUSPIRONE HCL 10 MG PO TABS
30.0000 mg | ORAL_TABLET | Freq: Three times a day (TID) | ORAL | Status: AC | PRN
Start: 2024-06-29 — End: 2024-07-01
  Administered 2024-06-30: 30 mg

## 2024-06-29 MED ORDER — DEXTROSE 50 % IV SOLN
INTRAVENOUS | Status: AC
Start: 2024-06-29 — End: 2024-06-29
  Administered 2024-06-29: 25 mL via INTRAVENOUS
  Filled 2024-06-29: qty 50

## 2024-06-29 MED ORDER — SODIUM BICARBONATE 8.4 % IV SOLN
INTRAVENOUS | Status: AC
Start: 2024-06-29 — End: 2024-06-29
  Administered 2024-06-29: 100 meq via INTRAVENOUS
  Filled 2024-06-29: qty 100

## 2024-06-29 NOTE — Progress Notes (Signed)
 eLink Physician-Brief Progress Note Patient Name: Rebecca Garibaldi Doe DOB: 11/16/1874 MRN: [id number]   Date of Service  06/29/2024  HPI/Events of Note  Norleen Doe admitted 8/14 after being found down for OOH cardiac arrest. Since this AM he has been weaned off pressors.   [facility] called for seizure like activity lasting ~3 min with facial twitching. Spontaneously resolved. Spot EEG  completed with read pending  eICU Interventions  PRN versed  10 mg if recurrent sz >5 min Neuro notified. May need LTM     Intervention Category Major Interventions: Seizures - evaluation and management  Jacaden Forbush Slater Staff 06/29/2024, 7:48 PM

## 2024-06-29 NOTE — Progress Notes (Signed)
 eLink Physician-Brief Progress Note Patient Name: Mike Sellers DOB: 11/16/1874 MRN: [id number]   Date of Service  06/29/2024  HPI/Events of Note  Episode of hypoglycemia. Recent seizure like episodes. On keppra  and cEEG per Neuro  eICU Interventions  Start D5LR     Intervention Category Intermediate Interventions: Electrolyte abnormality - evaluation and management  Paradise Vensel Slater Staff 06/29/2024, 11:40 PM

## 2024-06-29 NOTE — Plan of Care (Signed)
   Problem: Fluid Volume: Goal: Ability to maintain a balanced intake and output will improve Outcome: Progressing   Problem: Metabolic: Goal: Ability to maintain appropriate glucose levels will improve Outcome: Progressing   Problem: Skin Integrity: Goal: Risk for impaired skin integrity will decrease Outcome: Progressing   Problem: Clinical Measurements: Goal: Will remain free from infection Outcome: Progressing

## 2024-06-29 NOTE — Progress Notes (Signed)
 Critical results given to PCCM. RR increased from 24 to 28 on ventilator.   Latest Reference Range & Units 06/29/24 08:54  Sample type  ARTERIAL  pH, Arterial 7.35 - 7.45  7.035 (LL)  pCO2 arterial 32 - 48 mmHg 53.1 (H)  pO2, Arterial 83 - 108 mmHg 340 (H)  TCO2 22 - 32 mmol/L 17 (L)  Acid-base deficit 0.0 - 2.0 mmol/L 17.0 (H)  Bicarbonate 20.0 - 28.0 mmol/L 15.1 (L)  O2 Saturation % 100  Patient temperature  91.5 F  Sodium 135 - 145 mmol/L 145  Potassium 3.5 - 5.1 mmol/L 4.2  Calcium  Ionized 1.15 - 1.40 mmol/L 1.08 (L)  Hemoglobin 13.0 - 17.0 g/dL 84.3  HCT 60.9 - 47.9 % 46.0  (LL): Data is critically low (H): Data is abnormally high (L): Data is abnormally low

## 2024-06-29 NOTE — Progress Notes (Signed)
 EEG complete. Results pending.  ?

## 2024-06-29 NOTE — Procedures (Signed)
 Patient Name: Mike Sellers  MRN: [id number]  Epilepsy Attending: Arlin MALVA Krebs  Referring Physician/Provider: Harold Scholz, MD  Date: 06/29/2024 Duration: 21.28 mins  Patient history: unknown age male s/p cardiac arrest. EEG to evaluate for seizure  Level of alertness: comatose  AEDs during EEG study: Propofol   Technical aspects: This EEG study was done with scalp electrodes positioned according to the 10-20 International system of electrode placement. Electrical activity was reviewed with band pass filter of 1-70Hz , sensitivity of 7 uV/mm, display speed of 44mm/sec with a 60Hz  notched filter applied as appropriate. EEG data were recorded continuously and digitally stored.  Video monitoring was available and reviewed as appropriate.  Description: EEG showed burst suppression with epileptiform bursts lasting 0.5-1 seconds alternating with 8-12 seconds of generalized background suppression. Hyperventilation and photic stimulation were not performed.     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 No seizures were seen throughout the recording.  Yannis Broce O Savino Whisenant

## 2024-06-29 NOTE — Progress Notes (Signed)
 Initial Nutrition Assessment  DOCUMENTATION CODES:   Not applicable  INTERVENTION:   Goal: Initiate TF (at least trickle rate) within 24-48 hours of admission. Pt not yet stable for initiation of TF just yet  Tube Feeding Recommendations:  Vital AF 1.2 at 65 ml/hr Begin at 20 ml/hr with titration orders to goal TF goal provides 1872 kcals, 117 g of protein, 1264 mL of free water  Add Thiamine  100 mg per tube; if not tolerating enteral route, recommend switching to IV thiamine  x 5 doses   NUTRITION DIAGNOSIS:   Inadequate oral intake related to acute illness as evidenced by NPO status.  GOAL:   Patient will meet greater than or equal to 90% of their needs   MONITOR:   Vent status, Labs, Weight trends, TF tolerance, Skin  REASON FOR ASSESSMENT:   Ventilator    ASSESSMENT:    Male patient with unknown identity admitted post OOH cardiac arrest with unknown downtime, undifferentiated shock (cardiogenic vs septic), +AKI, shock liver, possible aspiration pneumonia.  Pt ws found down outside of [employer] with drug paraphernalia on site. Initial rhythm asystole, CPR x 13 minutes, Epi x 2 with Epi gtt, Intubated, levo initiated. PMH currently unknown. UDS+ amphetamines  Pt remains on vent support, sedated, hypothermic-currently requiring warming to maintain normothermia.Noted orders to initiate TTM cooling protocol if temp reaches or exceeds 37.6 celsius Levophed  at 5 Fentanyl  with propofol  started for sedation  OG tube in place with tip in stomach, gaseous distention of stomach noted. Small amount of dark output in cannister No documented BM, abd soft, BS present.   UDS+ for amphetamines, negative for EtOH.   Current wt 110 kg, no wt history. Unable to obtain nutrition history at this time  AKI with Creatinine 2.57, BUN wdl. Noted hyperphosphatemia, potassium trending up with hyperkalemia on I-stat draw this afternoon. Sodium wdl but high normal. Making some  urine  LFTs elevated, T.Bili 0.7  Labs:  Lactic Acid 3.7 Potassium 3.9 with serum draw this AM, noted 6.8 on i-Stat this afternoon Sodium 145 wdl BUN 11 wdl Creatinine 7.42  (H) Phosphorus >30 (H) Magnesium  3.0 (H) CBGs TG 78 wdl  Meds: Colace SS novolog  miralax   NUTRITION - FOCUSED PHYSICAL EXAM:  Flowsheet Row Most Recent Value  Orbital Region No depletion  Upper Arm Region Unable to assess  Thoracic and Lumbar Region No depletion  Buccal Region Unable to assess  Temple Region No depletion  Clavicle Bone Region No depletion  Clavicle and Acromion Bone Region No depletion  Scapular Bone Region No depletion  Dorsal Hand Unable to assess  Patellar Region No depletion  Anterior Thigh Region No depletion  Posterior Calf Region No depletion  Edema (RD Assessment) Mild    Diet Order:   Diet Order             Diet NPO time specified  Diet effective now                   EDUCATION NEEDS:   Not appropriate for education at this time  Skin:  Skin Assessment: Reviewed RN Assessment  Last BM:  PTA  Height:   Ht Readings from Last 1 Encounters:  06/29/24 5' 8 (1.727 m)    Weight:   Wt Readings from Last 1 Encounters:  06/29/24 110 kg     BMI:  Body mass index is 36.87 kg/m.  Estimated Nutritional Needs:   Kcal:  1750-1950 kcals  Protein:  85-105 g  Fluid:  >/=  1.8 L  Betsey Finger MS, RDN, LDN, CNSC Registered Dietitian 3 Clinical Nutrition RD Inpatient Contact Info in Amion

## 2024-06-29 NOTE — ED Triage Notes (Signed)
 Pt was outside [employer]. Pt was found to be in asystole and apneic. Drugs paraphernalia on scene with a crack pipe CPR started at 0720. ROSC achieved at 0733. EMS gave 2 epis and pt arrived on epi gtt. 2 mg of narcan IV given.

## 2024-06-29 NOTE — ED Provider Notes (Signed)
 [place] EMERGENCY DEPARTMENT AT [facility] Provider Note   CSN: [id number] Arrival date & time: 06/29/24  0800     Patient presents with: Cardiac Arrest   Mike Sellers is a 33 y.o. male unknown past medical history who presented to the emergency department via EMS for postarrest.  History obtained from EMS and [employer].  Patient was found outside of [employer] unconscious and without pulses.  Unknown downtime.  EMS performed CPR for 13 minutes with ROSC.  Initial rhythm asystole and no shockable rhythm found.  Patient was given epinephrine  prior to arrival.  2 mg Narcan IV given.  LMA was placed prior to arrival.  Patient was found with a backpack full of clothing suggestive of undomiciled status.  Drug paraphernalia was found at the scene.    Cardiac Arrest      Prior to Admission medications   Not on File    Allergies: Patient has no allergy information on record.    Review of Systems  Updated Vital Signs BP (!) 97/54   Pulse (!) 103   Temp (!) 97 F (36.1 C)   Resp (!) 33   Ht 5' 8 (1.727 m)   Wt 110 kg   SpO2 98%   BMI 36.87 kg/m   Physical Exam Vitals reviewed.  Constitutional:      Interventions: Cervical collar in place.     Comments: In extremis.  LMA in place  HENT:     Head: Normocephalic and atraumatic.     Right Ear: External ear normal.     Left Ear: External ear normal.     Mouth/Throat:     Mouth: No injury.  Eyes:     Comments: 4 mm round and minimally reactive  Neck:     Vascular: No JVD.     Trachea: Trachea normal.     Comments: Cervical spine collar in place Cardiovascular:     Rate and Rhythm: Normal rate and regular rhythm.     Pulses:          Radial pulses are 2+ on the right side and 2+ on the left side.     Heart sounds: Normal heart sounds. No murmur heard. Pulmonary:     Breath sounds: No decreased breath sounds, wheezing, rhonchi or rales.     Comments: LMA in place.  Ventilating well with SpO2 100% Chest:      Chest wall: No deformity.  Abdominal:     General: There is no distension.     Palpations: Abdomen is soft.     Hernia: No hernia is present.  Genitourinary:    Comments: Normal male Musculoskeletal:     Right lower leg: No edema.     Left lower leg: No edema.     Comments: No spontaneous movement of bilateral upper or lower extremities.  No obvious deformity, abrasion, laceration or ecchymosis.  Skin:    Capillary Refill: Capillary refill takes less than 2 seconds.  Neurological:     Mental Status: He is unresponsive.     GCS: GCS eye subscore is 1. GCS verbal subscore is 1. GCS motor subscore is 1.     (all labs ordered are listed, but only abnormal results are displayed) Labs Reviewed  COMPREHENSIVE METABOLIC PANEL WITH GFR - Abnormal; Notable for the following components:      Result Value   CO2 16 (*)    Glucose, Bld 123 (*)    Creatinine, Ser 2.57 (*)  Calcium  8.2 (*)    Total Protein 5.2 (*)    Albumin  3.0 (*)    AST 872 (*)    ALT 1,256 (*)    GFR, Estimated 16 (*)    Anion gap 21 (*)    All other components within normal limits  CBC - Abnormal; Notable for the following components:   WBC 25.8 (*)    MCHC 29.9 (*)    All other components within normal limits  APTT - Abnormal; Notable for the following components:   aPTT 42 (*)    All other components within normal limits  PROTIME-INR - Abnormal; Notable for the following components:   Prothrombin Time 18.2 (*)    INR 1.4 (*)    All other components within normal limits  MAGNESIUM  - Abnormal; Notable for the following components:   Magnesium  3.0 (*)    All other components within normal limits  PHOSPHORUS - Abnormal; Notable for the following components:   Phosphorus >30.0 (*)    All other components within normal limits  RAPID URINE DRUG SCREEN, HOSP PERFORMED - Abnormal; Notable for the following components:   Amphetamines POSITIVE (*)    All other components within normal limits  URINALYSIS, W/  REFLEX TO CULTURE (INFECTION SUSPECTED) - Abnormal; Notable for the following components:   APPearance HAZY (*)    Glucose, UA 50 (*)    Hgb urine dipstick MODERATE (*)    Protein, ur 100 (*)    Bacteria, UA RARE (*)    All other components within normal limits  SALICYLATE LEVEL - Abnormal; Notable for the following components:   Salicylate Lvl <7.0 (*)    All other components within normal limits  ACETAMINOPHEN  LEVEL - Abnormal; Notable for the following components:   Acetaminophen  (Tylenol ), Serum <10 (*)    All other components within normal limits  GLUCOSE, CAPILLARY - Abnormal; Notable for the following components:   Glucose-Capillary 117 (*)    All other components within normal limits  GLUCOSE, CAPILLARY - Abnormal; Notable for the following components:   Glucose-Capillary 119 (*)    All other components within normal limits  I-STAT CG4 LACTIC ACID, ED - Abnormal; Notable for the following components:   Lactic Acid, Venous 13.4 (*)    All other components within normal limits  CBG MONITORING, ED - Abnormal; Notable for the following components:   Glucose-Capillary 133 (*)    All other components within normal limits  I-STAT CHEM 8, ED - Abnormal; Notable for the following components:   Creatinine, Ser 2.30 (*)    Glucose, Bld 121 (*)    Calcium , Ion 1.01 (*)    TCO2 16 (*)    All other components within normal limits  I-STAT ARTERIAL BLOOD GAS, ED - Abnormal; Notable for the following components:   pH, Arterial 7.035 (*)    pCO2 arterial 53.1 (*)    pO2, Arterial 340 (*)    Bicarbonate 15.1 (*)    TCO2 17 (*)    Acid-base deficit 17.0 (*)    Calcium , Ion 1.08 (*)    All other components within normal limits  POCT I-STAT 7, (LYTES, BLD GAS, ICA,H+H) - Abnormal; Notable for the following components:   pH, Arterial 7.180 (*)    pCO2 arterial 50.9 (*)    pO2, Arterial 63 (*)    Bicarbonate 19.5 (*)    TCO2 21 (*)    Acid-base deficit 10.0 (*)    Potassium 6.8 (*)     Calcium , Ion  1.08 (*)    Hemoglobin 17.7 (*)    All other components within normal limits  CG4 I-STAT (LACTIC ACID) - Abnormal; Notable for the following components:   Lactic Acid, Venous 3.7 (*)    All other components within normal limits  TROPONIN I (HIGH SENSITIVITY) - Abnormal; Notable for the following components:   Troponin I (High Sensitivity) 110 (*)    All other components within normal limits  MRSA NEXT GEN BY PCR, NASAL  CULTURE, RESPIRATORY W GRAM STAIN  TRIGLYCERIDES  ETHANOL  BLOOD GAS, ARTERIAL  BLOOD GAS, ARTERIAL  BASIC METABOLIC PANEL WITH GFR  I-STAT CG4 LACTIC ACID, ED  I-STAT CG4 LACTIC ACID, ED  I-STAT CG4 LACTIC ACID, ED  I-STAT CG4 LACTIC ACID, ED  I-STAT CG4 LACTIC ACID, ED  TYPE AND SCREEN  ABO/RH  TROPONIN I (HIGH SENSITIVITY)    EKG: EKG Interpretation Date/Time:  Thursday June 29 2024 08:06:17 EDT Ventricular Rate:  89 PR Interval:  144 QRS Duration:  119 QT Interval:  410 QTC Calculation: 499 R Axis:   80  Text Interpretation: Sinus rhythm Incomplete right bundle branch block Inferior infarct, age indeterminate ST depression, probably rate related no prior ECG for comparison No STEMI Confirmed by Ginger Barefoot ([id number]) on 06/29/2024 8:51:48 AM  Radiology: [facility] CHEST PORT 1 VIEW Result Date: 06/29/2024 CLINICAL DATA:  [id number] Encounter for central line placement [id number] EXAM: PORTABLE CHEST 1 VIEW COMPARISON:  Same day chest radiograph dated 06/29/2024 at 9 a.m. FINDINGS: Left IJ CVC catheter tip overlies the lower SVC. Endotracheal tube tip is located 4 cm above the carina. Enteric tube side port at the level of the GE junction, recommend advancement by 3 6 cm. No pneumothorax. The heart size and mediastinal contours are unchanged. Mild left basilar atelectasis. Otherwise, no significant change. IMPRESSION: 1. Left IJ CVC catheter tip overlies the lower SVC. 2. Enteric tube side port at the level of the GE junction, recommend advancement by 3 6 cm.  3. Endotracheal tube tip is located 4 cm above the carina. 4. Mild left basilar atelectasis. Electronically Signed   By: Harrietta Sherry M.D.   On: 06/29/2024 13:47   DG Chest Portable 1 View Result Date: 06/29/2024 CLINICAL DATA:  Cervical spine CT today. EXAM: PORTABLE CHEST 1 VIEW COMPARISON:  Portable AP supine view at 0900 hours. Enteric tube courses to the left upper quadrant, appears to loop there and the stomach is distended with gas. Endotracheal tube tip appears to terminates above the carina on this image. Normal cardiac size and mediastinal contours. Left lower chest pacer/resuscitation pad. Allowing for portable technique the lungs are clear. No pneumothorax or pleural effusion identified on this supine view. No displaced rib fracture identified. FINDINGS: The heart size and mediastinal contours are within normal limits. Both lungs are clear. The visualized skeletal structures are unremarkable. IMPRESSION: 1. ETT tip position appears satisfactory on this image, enteric tube looped in the stomach which is distended with gas. 2. No acute cardiopulmonary abnormality identified. Electronically Signed   By: VEAR Hurst M.D.   On: 06/29/2024 10:03   CT Cervical Spine Wo Contrast Result Date: 06/29/2024 CLINICAL DATA:  Male of unknown age, found down, asystole, CPR, intubated. EXAM: CT CERVICAL SPINE WITHOUT CONTRAST TECHNIQUE: Multidetector CT imaging of the cervical spine was performed without intravenous contrast. Multiplanar CT image reconstructions were also generated. RADIATION DOSE REDUCTION: This exam was performed according to the departmental dose-optimization program which includes automated exposure control, adjustment of the mA and/or kV  according to patient size and/or use of iterative reconstruction technique. COMPARISON:  Head CT today. FINDINGS: Alignment: Straightening of cervical lordosis. Cervicothoracic junction alignment is within normal limits. Bilateral posterior element alignment is  within normal limits. Skull base and vertebrae: Bone mineralization is within normal limits. Visualized skull base is intact. No atlanto-occipital dissociation. C1 and C2 appear intact and aligned. No osseous abnormality identified. Soft tissues and spinal canal: No prevertebral fluid or swelling. No visible canal hematoma. Intubated, satisfactory course of the visible ET tube into the subglottic trachea. Enteric tube courses into the esophagus after briefly looping in the pharynx, which does not appear agree just. Negative other visible noncontrast neck soft tissues. Disc levels:  Negative. Upper chest: Endotracheal tube tip may extend to the carina on series 4, image 125. Enteric tube within the esophagus. Confluent dependent lung opacity in the apices. Visible upper thoracic levels appear intact. IMPRESSION: 1. No acute traumatic injury identified in the cervical spine. 2. Endotracheal tube tip may extend to the carina. Enteric tube courses into the esophagus after briefly looping in the pharynx - satisfactory and does not require repositioning. 3. Confluent dependent lung opacity in the apices, possibly Aspiration. Electronically Signed   By: VEAR Hurst M.D.   On: 06/29/2024 10:01   CT HEAD WO CONTRAST Result Date: 06/29/2024 CLINICAL DATA:  Male of unknown age, found down, asystole, CPR, intubated. EXAM: CT HEAD WITHOUT CONTRAST TECHNIQUE: Contiguous axial images were obtained from the base of the skull through the vertex without intravenous contrast. RADIATION DOSE REDUCTION: This exam was performed according to the departmental dose-optimization program which includes automated exposure control, adjustment of the mA and/or kV according to patient size and/or use of iterative reconstruction technique. COMPARISON:  Head CT 07/10/2020. FINDINGS: Brain: Maintained gray-white differentiation. Relatively normal cerebral volume. No midline shift, ventriculomegaly, mass effect, evidence of mass lesion, intracranial  hemorrhage or evidence of cortically based acute infarction. Vascular: No suspicious intracranial vascular hyperdensity. Skull: No fracture identified. Sinuses/Orbits: Intubated, mild paranasal sinus mucosal thickening. Tympanic cavities and mastoids remain clear. Other: No acute orbit or scalp soft tissue injury identified. Oral enteric tube is looped in the pharynx on series 4, image 1. IMPRESSION: 1. No acute intracranial abnormality by CT. No acute traumatic injury identified. 2. Oral enteric tube looped in the pharynx, recommend repositioning. Electronically Signed   By: VEAR Hurst M.D.   On: 06/29/2024 09:59   DG Abd Portable 1V Result Date: 06/29/2024 CLINICAL DATA:  Endotracheal and nasogastric tube placement. History of cardiac arrest. EXAM: PORTABLE ABDOMEN - 1 VIEW COMPARISON:  None Available. FINDINGS: The bowel gas pattern is non-obstructive. No evidence of pneumoperitoneum, within the limitations of a supine film. No acute osseous abnormalities. The soft tissues are within normal limits. Surgical changes, devices, tubes and lines: Multiple monitoring electrodes seen overlying the upper abdomen. Enteric tube is not discretely seen. Please refer to contemporaneously obtained chest radiograph for details. IMPRESSION: Nonobstructive bowel gas pattern. Electronically Signed   By: Ree Molt M.D.   On: 06/29/2024 08:53   DG Chest Port 1 View Result Date: 06/29/2024 CLINICAL DATA:  Endotracheal and OG tube placement. History of cardiac arrest. EXAM: PORTABLE CHEST 1 VIEW COMPARISON:  04/27/2022. FINDINGS: Left lateral lower hemithorax is not included in the film. Visualized bilateral lung fields are grossly clear. Right lateral costophrenic angle is clear. Normal cardio-mediastinal silhouette. No acute osseous abnormalities. The soft tissues are within normal limits. Endotracheal tube is seen with its tip approximately 3 cm above the  carina. Enteric tube is seen coursing below the left hemidiaphragm  with its tip overlying the left upper quadrant, overlying the fundus of the stomach region. The side hole is likely at or just above the left hemidiaphragm level. Consider further advancement by 2-3 inches to confidently put the side hole into the stomach. IMPRESSION: No active disease. Support apparatus, as described above. Electronically Signed   By: Ree Molt M.D.   On: 06/29/2024 08:51     Procedure Name: Intubation Date/Time: 06/29/2024 8:25 AM  Performed by: Nada Chroman, DOOxygen Delivery Method: Ambu bag Preoxygenation: Pre-oxygenation with 100% oxygen Induction Type: Rapid sequence Laryngoscope Size: Mac and 4 Grade View: Grade I Tube size: 8.0 mm Number of attempts: 1 Placement Confirmation: ETT inserted through vocal cords under direct vision, Positive ETCO2, CO2 detector and Breath sounds checked- equal and bilateral Secured at: 26 cm Tube secured with: ETT holder       Medications Ordered in the ED  norepinephrine  (LEVOPHED ) 4mg  in (0.016 mg/mL) premix infusion (5 mcg/min Intravenous Infusion Verify 06/29/24 1100)  Chlorhexidine  Gluconate Cloth 2 % PADS 6 each (6 each Topical Given 06/29/24 1010)  heparin  injection 5,000 Units (has no administration in time range)  pantoprazole  (PROTONIX ) injection 40 mg (40 mg Intravenous Given 06/29/24 1011)  ondansetron  (ZOFRAN ) injection 4 mg (has no administration in time range)  insulin  aspart (novoLOG ) injection 0-9 Units ( Subcutaneous Not Given 06/29/24 1138)  docusate (COLACE) 50 MG/5ML liquid 100 mg (100 mg Per Tube Not Given 06/29/24 1047)  polyethylene glycol (MIRALAX  / GLYCOLAX ) packet 17 g (17 g Per Tube Not Given 06/29/24 1047)  midazolam  (VERSED ) injection 1-2 mg (2 mg Intravenous Given 06/29/24 1215)  Ampicillin -Sulbactam (UNASYN ) 3 g in sodium chloride  0.9 % 100 mL IVPB (has no administration in time range)  albuterol  (PROVENTIL ) (2.5 MG/3ML) 0.083% nebulizer solution 2.5 mg (has no administration in time range)   Oral care mouth rinse (has no administration in time range)  Oral care mouth rinse (has no administration in time range)  propofol  (DIPRIVAN ) 1000 MG/100ML infusion (5 mcg/kg/min  110 kg Intravenous New Bag/Given 06/29/24 1339)  acetaminophen  (TYLENOL ) tablet 650 mg (has no administration in time range)    Or  acetaminophen  (TYLENOL ) 160 MG/5ML solution 650 mg (has no administration in time range)    Or  acetaminophen  (TYLENOL ) suppository 650 mg (has no administration in time range)  busPIRone  (BUSPAR ) tablet 30 mg (has no administration in time range)    Or  busPIRone  (BUSPAR ) tablet 30 mg (has no administration in time range)  magnesium  sulfate IVPB 2 g 50 mL (has no administration in time range)  norepinephrine  (LEVOPHED ) 4mg  in (0.016 mg/mL) premix infusion (has no administration in time range)  0.9 %  sodium chloride  infusion (has no administration in time range)  fentaNYL  in NS (10mcg/ml) infusion-PREMIX (50 mcg/hr Intravenous New Bag/Given 06/29/24 1342)  fentaNYL  (SUBLIMAZE ) bolus via infusion 50-100 mcg (has no administration in time range)  fentaNYL  (SUBLIMAZE ) injection 50 mcg (50 mcg Intravenous Given 06/29/24 1235)  calcium  gluconate 1 g/ 50 mL sodium chloride  IVPB ( Intravenous Infusion Verify 06/29/24 1100)  sodium bicarbonate  injection 50 mEq (50 mEq Intravenous Given 06/29/24 1005)  lactated ringers  bolus 1,000 mL ( Intravenous Infusion Verify 06/29/24 1100)  fentaNYL  (SUBLIMAZE ) injection 50 mcg (50 mcg Intravenous Given 06/29/24 1349)  sodium bicarbonate  injection 100 mEq (100 mEq Intravenous Given 06/29/24 1346)    Clinical Course as of 06/29/24 1503  Thu Jun 29, 2024  [id number] Chest x-ray reviewed by me: ET tube approximately 1 to 2 cm above carina.  No obvious pneumothorax, cardiomegaly, pleural effusion, focal consolidative findings. [AG]  [id number] Abdominal exam performed G-tube in appropriate position.  No subdiaphragmatic air [AG]  0857 EKG reviewed by  me: Normal sinus rhythm with normal axis and mildly elevated QTc.  ST depressions in leads II, III, aVF as well as V5 and V6. [AG]    Clinical Course User Index [AG] Nada Chroman, DO                                 Medical Decision Making Amount and/or Complexity of Data Reviewed Labs: ordered. Radiology: ordered.  Risk Prescription drug management. Decision regarding hospitalization.   On initial evaluation patient is an extremis, LMA in place with SpO2 100% and end-tidal within normal limits.  Manual blood pressure with evidence of hypotension on  epinephrine  therefore will start patient on Levophed .  Pads placed on patient.  LMA with adequate oxygenation therefore kept in place during resuscitation with bilateral breath sounds present.  Patient without refractory hypotension on pressors therefore  intubation performed and successful.  Postintubation epinephrine  was stopped and Levophed  continued.  Chest x-ray obtained, ET tube in place without other acute cardiopulmonary abnormalities and no subdiaphragmatic air.  Laboratory studies significant for lactic acidosis, AKI, leukocytosis most likely secondary to acute stress reaction and cardiopulmonary resuscitation.  Patient remained stable on Levophed .  ICU consulted and patient was admitted to ICU for continued care and management      Final diagnoses:  None    ED Discharge Orders     None       Chroman Nada DO Emergency Medicine PGY2    Nada Chroman, DO 06/29/24 1503    Tegeler, Lonni PARAS, MD 06/30/24 1247

## 2024-06-29 NOTE — H&P (Addendum)
 NAME:  Rebecca Nan Pert, MRN:  [id number], DOB:  11/16/1874, LOS: 0 ADMISSION DATE:  06/29/2024 CONSULTATION DATE:  06/29/2024 REFERRING MD:  Tegeler - EDP, CHIEF COMPLAINT:  Post-cardiac arrest, found down   History of Present Illness:  Unknown age man who presented to [facility] ED via EMS 8/14 after OOH cardiac arrest with unknown downtime. PMHx unknown.  Patient was reportedly found down outside of the [employer] with drug paraphernalia on site (crack pipe, lighters) with wet clothing/personal belongings. Per police, a Lexicographer reported seeing patient down around his arrival to work at 0400; at that time patient was breathing. On EMS arrival, patient was pulseless and apneic. Narcan was given without significant response. Initial rhythm was asystole and patient received CPR x 13 minutes, Epi x 2 with ROSC. Arrived to ED on Epi gtt. LMA placed. Intubated on arrival, no reported signs of responsiveness en route. On ED arrival, patient was afebrile, HR 80s, RR 25, BP 113/55 on Epi gtt, SpO2 100%. GCS 3.   Labs were notable for WBC 25.8, Hgb 14.7, Plt 231. INR 1.4. Na 145, K 3.9, CO2 16, BUN/Cr 11/2.57. Phos > 30. Mg 3.0. AST/ALT 872/1256, Alk Phos 99, Tbili 0.7. Trop 110. UA gluc 50, mod Hgb, prot 100. UDS +amphetamines. Initial CXR unremarkable. CT Head NAICA, CT C-Spine negative for acute traumatic injury.  PCCM consulted for ICU admission.  Pertinent Medical History:  History reviewed. No pertinent past medical history.  Significant Hospital Events: Including procedures, antibiotic start and stop dates in addition to other pertinent events   8/14 - Presented to [facility] ED as OOH cardiac arrest, [name] with unknown downtime. Initial rhythm asystole, CPR x 13 min, Epi x 2, Epi gtt. Intubated on ED arrival and NE started. CXR unremarkable. Labs/imaging pending.  Interim History / Subjective:  PCCM consulted for ICU admission.  Objective:  Blood pressure 127/83, pulse 94, temperature 98.5 F  (36.9 C), temperature source Temporal, resp. rate (!) 28, height 5' 8 (1.727 m), weight 110 kg, SpO2 100%.    Vent Mode: PRVC FiO2 (%):  [100 %] 100 % Set Rate:  [24 bmp-28 bmp] 28 bmp Vt Set:  [550 mL] 550 mL PEEP:  [5 cmH20] 5 cmH20 Plateau Pressure:  [28 cmH20] 28 cmH20  No intake or output data in the 24 hours ending 06/29/24 0930 Filed Weights   06/29/24 0812  Weight: 110 kg   Physical Examination: General: Acutely ill-appearing middle-aged man in NAD. HEENT: [place]/AT, anicteric sclera, PERRL 3mm minimally reactive, moist mucous membranes. Dried blood versus dark emesis around mouth/in oropharynx. Prior healed scar to R forehead/scalp. Neuro: Unresponsive. Does not respond to verbal, tactile or noxious stimuli. Does not withdraw to pain.Not following commands. No spontaneous movement of extremities noted. No cough/gag.  CV: RRR, no m/g/r. PULM: Breathing even and unlabored on vent (PEEP 5, FiO2 100%). Lung fields with coarse rhonchi throughout, worse in L upper fields. Thick dark brown secretions from in-line suction. GI: Obese, soft, nontender, nondistended. Hypoactive bowel sounds. Extremities: No significant LE edema noted. Prior well healed wound/abrasion to R shin. Skin: Warm/dry, no rashes. Lion tattoo to R shoulder.  Resolved Hospital Problem List:    Assessment & Plan:  Post-cardiac arrest, out of hospital with unknown downtime Undifferentiated shock, cardiogenic versus septic component Suspect hypoxic arrest in the setting of drug use, ?aspiration. - Admit to ICU for close monitoring - Targeted temperature management/normothermia protocol - Goal MAP > 65 - Fluid resuscitation as tolerated - Levophed  titrated  to goal MAP, currently downtitrating - Off of Epi gtt - Trend troponins - F/u Echo  Leukocytosis, ?sepsis Lactic acidosis - Trend WBC, fever curve, LA - F/u Cx data - Continue broad-spectrum antibiotics (Unasyn )  Acute hypoxemic respiratory failure in the  setting of cardiac arrest Concern for aspiration PNA - Continue full vent support (4-8cc/kg IBW) - Wean FiO2 for O2 sat > 90% - Daily WUA/SBT once appropriate, not clinically appropriate at this time due to poor mental status - VAP bundle - Pulmonary hygiene - PAD protocol for sedation: Propofol  and Fentanyl  for goal RASS 0 to -1; off sedation at present and remains unresponsive - Follow CXR, ABG - Unasyn  for aspiration coverage  AKI, likely ATN in the setting of cardiac arrest/shock - Trend BMP - Replete electrolytes as indicated - Monitor I&Os - F/u urine studies - Avoid nephrotoxic agents as able - Ensure adequate renal perfusion  Shock liver - Trend LFTs daily  Substance abuse Received Narcan with EMS with no response. - UDS +amphetamines - Police involved for identification purposes - TOC consult to help address substance abuse/?unhoused needs if clinical status improves  Best Practice: (right click and Reselect all SmartList Selections daily)   Diet/type: NPO DVT prophylaxis: SCDs, SQH GI prophylaxis: PPI Lines: N/A Foley:  Yes, and it is still needed Code Status:  full code Last date of multidisciplinary goals of care discussion [Pending]  Labs:  CBC: Recent Labs  Lab 06/29/24 0820 06/29/24 0825 06/29/24 0854  WBC  --  25.8*  --   HGB 14.6 14.7 15.6  HCT 43.0 49.2 46.0  MCV  --  96.1  --   PLT  --  231  --    Basic Metabolic Panel: Recent Labs  Lab 06/29/24 0820 06/29/24 0854  NA 144 145  K 3.7 4.2  CL 109  --   GLUCOSE 121*  --   BUN 14  --   CREATININE 2.30*  --    GFR: Estimated Creatinine Clearance: -4.1 mL/min (A) (by C-G formula based on SCr of 2.3 mg/dL (H)). Recent Labs  Lab 06/29/24 0820 06/29/24 0825  WBC  --  25.8*  LATICACIDVEN 13.4*  --    Liver Function Tests: No results for input(s): AST, ALT, ALKPHOS, BILITOT, PROT, ALBUMIN  in the last 168 hours. No results for input(s): LIPASE, AMYLASE in the last 168  hours. No results for input(s): AMMONIA in the last 168 hours.  ABG:    Component Value Date/Time   PHART 7.035 (LL) 06/29/2024 0854   PCO2ART 53.1 (H) 06/29/2024 0854   PO2ART 340 (H) 06/29/2024 0854   HCO3 15.1 (L) 06/29/2024 0854   TCO2 17 (L) 06/29/2024 0854   ACIDBASEDEF 17.0 (H) 06/29/2024 0854   O2SAT 100 06/29/2024 0854    Coagulation Profile: Recent Labs  Lab 06/29/24 0825  INR 1.4*   Cardiac Enzymes: No results for input(s): CKTOTAL, CKMB, CKMBINDEX, TROPONINI in the last 168 hours.  HbA1C: No results found for: HGBA1C  CBG: Recent Labs  Lab 06/29/24 0839  GLUCAP 133*   Review of Systems:   Patient is encephalopathic and/or intubated; therefore, history has been obtained from chart review.   Past Medical History:  He,  has no past medical history on file.   Surgical History:  History reviewed. No pertinent surgical history.   Social History:   reports current drug use.   Family History:  His family history is not on file.   Allergies: Not on File   Home Medications:  Prior to Admission medications   Not on File    Critical care time:   The patient is critically ill with multiple organ system failure and requires high complexity decision making for assessment and support, frequent evaluation and titration of therapies, advanced monitoring, review of radiographic studies and interpretation of complex data.   Critical Care Time devoted to patient care services, exclusive of separately billable procedures, described in this note is 42 minutes.  Corean CHRISTELLA Tuck Dulworth, PA-C [facility] 06/29/24 9:30 AM  Please see [email or web address] for pager details.  From 7A-7P if no response, please call [phone] After hours, please call ELink [phone]

## 2024-06-29 NOTE — Consult Note (Signed)
 NEUROLOGY CONSULT NOTE   Date of service: June 29, 2024 Patient Name: Mike Sellers MRN:  [id number] DOB:  11/16/1874 Chief Complaint: seizure, facial twitching x 3 mins Requesting Provider: Harold Scholz, MD  History of Present Illness  Mike Sellers is a 33 y.o. male with unknown hx who presents with out of hospital cardiac arrest. He is intubated and unable to provide any history.  Per chart, was found down [place] [facility] with drug paraphernalia. Unclear downtime. EMS found him pulseless and anpeic. No improvement with Narcan. Initiall rhythm was asystole. CPR x 13 mins before ROSC, LMA in field and intubated in the ED and admitted to ICU.  Routine EEG with highly epileptiform bursts and suppressed background. Was noted to have a 3 mins episode of facial twitching for which neurology consulted.  CT Head with no acute abnormalities. Uds positive for amphetamines.  On my evaluation, he has stimulus incuduced full bosy myoclonus with eye opening, facial and BL uppers and lower extremity twitching.    ROS  Unable to ascertain due to intubated and sedated.  Past History  History reviewed. No pertinent past medical history.  History reviewed. No pertinent surgical history.  Family History: History reviewed. No pertinent family history.  Social History  reports current drug use. No history on file for tobacco use and alcohol  use.  Not on File  Medications   Current Facility-Administered Medications:    0.9 %  sodium chloride  infusion, 250 mL, Intravenous, Continuous, Chand, Sudham, MD   acetaminophen  (TYLENOL ) tablet 650 mg, 650 mg, Per Tube, Q4H, 650 mg at 06/29/24 1704 **OR** acetaminophen  (TYLENOL ) 160 MG/5ML solution 650 mg, 650 mg, Per Tube, Q4H **OR** acetaminophen  (TYLENOL ) suppository 650 mg, 650 mg, Rectal, Q4H, Chand, Sudham, MD   albuterol  (PROVENTIL ) (2.5 MG/3ML) 0.083% nebulizer solution 2.5 mg, 2.5 mg, Nebulization, Q4H PRN, Gleason, [name], PA-C    Ampicillin -Sulbactam (UNASYN ) 3 g in sodium chloride  0.9 % 100 mL IVPB, 3 g, Intravenous, Q6H, Chand, Scholz, MD, Stopped at 06/29/24 1749   busPIRone  (BUSPAR ) tablet 30 mg, 30 mg, Per Tube, Q8H PRN **OR** busPIRone  (BUSPAR ) tablet 30 mg, 30 mg, Per Tube, Q8H PRN, Harold Scholz, MD   Chlorhexidine  Gluconate Cloth 2 % PADS 6 each, 6 each, Topical, Daily, Ilah Krabbe M, PA-C, 6 each at 06/29/24 1010   docusate (COLACE) 50 MG/5ML liquid 100 mg, 100 mg, Per Tube, BID, Ilah, Stephanie M, PA-C   fentaNYL  (SUBLIMAZE ) bolus via infusion 50-100 mcg, 50-100 mcg, Intravenous, Q15 min PRN, Chand, Sudham, MD, 50 mcg at 06/29/24 1720   fentaNYL  in NS (18mcg/ml) infusion-PREMIX, 50-200 mcg/hr, Intravenous, Continuous, Chand, Scholz, MD, Last Rate: 5 mL/hr at 06/29/24 1900, 50 mcg/hr at 06/29/24 1900   heparin  injection 5,000 Units, 5,000 Units, Subcutaneous, Q8H, Reese, Stephanie M, PA-C   insulin  aspart (novoLOG ) injection 0-9 Units, 0-9 Units, Subcutaneous, Q4H, Reese, Stephanie M, PA-C, 1 Units at 06/29/24 1700   levETIRAcetam  (KEPPRA ) undiluted injection 2,500 mg, 2,500 mg, Intravenous, Once, Abrea Henle, MD   levETIRAcetam  (KEPPRA ) undiluted injection 500 mg, 500 mg, Intravenous, BID, Calvin Jablonowski, MD   magnesium  sulfate IVPB 2 g 50 mL, 2 g, Intravenous, Once PRN, Chand, Sudham, MD   midazolam  (VERSED ) injection 1-2 mg, 1-2 mg, Intravenous, Q1H PRN, Ilah Krabbe M, PA-C, 2 mg at 06/29/24 1215   midazolam  (VERSED ) injection 10 mg, 10 mg, Intravenous, Once PRN, Kassie Acquanetta Bradley, MD   norepinephrine  (LEVOPHED ) 4mg  in (0.016 mg/mL) premix infusion, 0-40 mcg/min, Intravenous,  Continuous, Tegeler, Lonni PARAS, MD, Stopped at 07-28-2024 1701   norepinephrine  (LEVOPHED ) 4mg  in (0.016 mg/mL) premix infusion, 2-10 mcg/min, Intravenous, Titrated, Chand, Sudham, MD   ondansetron  (ZOFRAN ) injection 4 mg, 4 mg, Intravenous, Q6H PRN, Ilah Corean HERO, PA-C   Oral care mouth  rinse, 15 mL, Mouth Rinse, Q2H, Chand, Sudham, MD, 15 mL at 07/28/24 1800   Oral care mouth rinse, 15 mL, Mouth Rinse, PRN, Harold Scholz, MD   pantoprazole  (PROTONIX ) injection 40 mg, 40 mg, Intravenous, Q12H, Reese, Stephanie M, PA-C, 40 mg at 07-28-24 1011   polyethylene glycol (MIRALAX  / GLYCOLAX ) packet 17 g, 17 g, Per Tube, Daily, Ilah, Stephanie M, PA-C   propofol  (DIPRIVAN ) 1000 MG/100ML infusion, 0-50 mcg/kg/min, Intravenous, Continuous, Chand, Sudham, MD, Last Rate: 6.6 mL/hr at 2024-07-28 1900, 10 mcg/kg/min at 07-28-24 1900   sodium zirconium cyclosilicate  (LOKELMA ) packet 10 g, 10 g, Per Tube, TID, Harold Scholz, MD, 10 g at 07-28-2024 1704   thiamine  (VITAMIN B1) tablet 100 mg, 100 mg, Per Tube, Daily, Chand, Sudham, MD, 100 mg at 2024-07-28 1704  Vitals   Vitals:   2024-07-28 1830 2024-07-28 1845 28-Jul-2024 1900 07-28-2024 1915  BP: 135/85 136/88 133/81 (!) 141/85  Pulse: 86   (!) 102  Resp: (!) 32 (!) 32 (!) 32 (!) 26  Temp:   100 F (37.8 C)   TempSrc:      SpO2: 100%   100%  Weight:      Height:        Body mass index is 36.87 kg/m.   Physical Exam   General: Laying comfortably in bed; in no acute distress. HENT: Normal oropharynx and mucosa. Normal external appearance of ears and nose. EET in place. Neck: Supple, no pain or tenderness CV: No JVD. No peripheral edema. Pulmonary: Symmetric Chest rise. Triggers vent spontaneously. Abdomen: Soft to touch, non-tender.  Ext: No cyanosis, edema, or deformity  Skin: No rash. Normal palpation of skin.   Musculoskeletal: Normal digits and nails by inspection. No clubbing.   Neurologic Examination  Mental status/Cognition: no response to voice or loud clap. To tactile stimulus, he has full body myoclonic jerks. Speech/language: mute, no speech, no attempts to communicate. Cranial nerves:   CN II Pupils are equal and round but unreactive to bright light   CN III,IV,VI No gaze preference or deviation. Dolls eye reflex is absent.    CN V Corneals absent BL   CN VII No purposeful facial movements or grimace to noxious stimuli   CN VIII Does not turn head towards speech   CN IX & X Gag intact, no cough   CN XI Head is slightly tilted to left.   CN XII Does not protrude tongue on command.   Motor:  Muscle bulk: normal, tone flaccid in all extremities with intermittent full body myoclonic jerks with eye opening, facial twtiching and all extremity brief twitching.  No purposeful movements noted, no response to proximal pinch in any of the extremities.  Coordination/Complex Motor:  - Unable to assess.  Labs/Imaging/Neurodiagnostic studies   CBC:  Recent Labs  Lab 2024/07/28 0825 2024/07/28 0854 07-28-24 1300 July 28, 2024 1702  WBC 25.8*  --   --   --   HGB 14.7   < > 17.7* 17.0  HCT 49.2   < > 52.0 50.0  MCV 96.1  --   --   --   PLT 231  --   --   --    < > = values in this  interval not displayed.   Basic Metabolic Panel:  Lab Results  Component Value Date   NA 143 06/29/2024   K 4.7 06/29/2024   CO2 21 (L) 06/29/2024   GLUCOSE 145 (H) 06/29/2024   BUN 17 06/29/2024   CREATININE 1.87 (H) 06/29/2024   CALCIUM  7.0 (L) 06/29/2024   GFRNONAA 24 (L) 06/29/2024   Lipid Panel: No results found for: LDLCALC HgbA1c: No results found for: HGBA1C Urine Drug Screen:     Component Value Date/Time   LABOPIA NONE DETECTED 06/29/2024 0836   COCAINSCRNUR NONE DETECTED 06/29/2024 0836   LABBENZ NONE DETECTED 06/29/2024 0836   AMPHETMU POSITIVE (A) 06/29/2024 0836   THCU NONE DETECTED 06/29/2024 0836   LABBARB NONE DETECTED 06/29/2024 0836    Alcohol  Level     Component Value Date/Time   ETH <15 06/29/2024 0836   INR  Lab Results  Component Value Date   INR 1.4 (H) 06/29/2024   APTT  Lab Results  Component Value Date   APTT 42 (H) 06/29/2024   AED levels: No results found for: PHENYTOIN, ZONISAMIDE, LAMOTRIGINE, LEVETIRACETA  CT Head without contrast(Personally reviewed): CTH was negative  for a large hypodensity concerning for a large territory infarct or hyperdensity concerning for an ICH  Neurodiagnostics rEEG:  Suppressed background with highly epileptiform bursts.  ASSESSMENT   Mike Sellers is a 33 y.o. male with unknown hx, OOH cardiac arrest, initial rhythm of asystole and apneic. CPR x 13 mins and ROSC. Unclear downtime. He is currently admitted to the ICU, noted to have episodes of facial twitching concerning for seizures.  rEEG with suppressed background with highly epileptiform bursts. Neuro exam while on propofol  at 15 and Fentanyl  PRN  with stimulus induced full body myoclonic jerks. No corneals, pupils, weak gag, no cough. No purposeful movements.  RECOMMENDATIONS  - cEEG overnight - Keppra  2500mg  IV load once, followed by Keppra  500mg  BID - continue propofol  at 15. - further AEDs based on LTM EEG. - MRI brain between day 3-5 - avoid hypotension, hyponatremia, hyperthermia. - neurology will continue to follow along. ______________________________________________________________________  This patient is critically ill and at significant risk of neurological worsening, death and care requires constant monitoring of vital signs, hemodynamics,respiratory and cardiac monitoring, neurological assessment, discussion with family, other specialists and medical decision making of high complexity. I spent 55 minutes of neurocritical care time  in the care of  this patient. This was time spent independent of any time provided by nurse practitioner or PA.  Alithia Zavaleta [facility] 06/29/2024  10:03 PM   Signed, Brittie Whisnant, MD [facility]

## 2024-06-29 NOTE — Procedures (Signed)
 Arterial Catheter Insertion Procedure Note  Rebecca Garibaldi Doe  [id number]  11/16/1874  Date:06/29/24  Time:1:33 PM    Provider Performing: [name] Algis Lehenbauer    Procedure: Insertion of Arterial Line (63379) with US  guidance (23062)   Indication(s) Blood pressure monitoring and/or need for frequent ABGs  Consent Unable to obtain consent due to emergent nature of procedure.  Anesthesia None   Time Out Verified patient identification, verified procedure, site/side was marked, verified correct patient position, special equipment/implants available, medications/allergies/relevant history reviewed, required imaging and test results available.   Sterile Technique Maximal sterile technique including full sterile barrier drape, hand hygiene, sterile gown, sterile gloves, mask, hair covering, sterile ultrasound probe cover (if used).   Procedure Description Area of catheter insertion was cleaned with chlorhexidine  and draped in sterile fashion. With real-time ultrasound guidance an arterial catheter was placed into the left femoral artery.  Appropriate arterial tracings confirmed on monitor.     Complications/Tolerance None; patient tolerated the procedure well.   EBL Minimal   Specimen(s) None   [name] Hamzah Savoca, PA-C

## 2024-06-29 NOTE — Procedures (Signed)
 Central Venous Catheter Insertion Procedure Note  Mike Sellers  [id number]  11/16/1874  Date:06/29/24  Time:1:32 PM   Provider Performing:Rosanne Wohlfarth R Saxon Crosby   Procedure: Insertion of Non-tunneled Central Venous Catheter(36556) with US  guidance (23062)   Indication(s) Medication administration  Consent Unable to obtain consent due to emergent nature of procedure.  Anesthesia Topical only with 1% lidocaine    Timeout Verified patient identification, verified procedure, site/side was marked, verified correct patient position, special equipment/implants available, medications/allergies/relevant history reviewed, required imaging and test results available.  Sterile Technique Maximal sterile technique including full sterile barrier drape, hand hygiene, sterile gown, sterile gloves, mask, hair covering, sterile ultrasound probe cover (if used).  Procedure Description Area of catheter insertion was cleaned with chlorhexidine  and draped in sterile fashion.  With real-time ultrasound guidance a central venous catheter was placed into the left internal jugular vein. Nonpulsatile blood flow and easy flushing noted in all ports.  The catheter was sutured in place and sterile dressing applied.  Complications/Tolerance None; patient tolerated the procedure well. Chest X-ray is ordered to verify placement for internal jugular or subclavian cannulation.   Chest x-ray is not ordered for femoral cannulation.  EBL Minimal  Specimen(s) None   Mike Sellers Kal Chait, PA-C

## 2024-06-30 ENCOUNTER — Encounter (HOSPITAL_COMMUNITY): Payer: Self-pay

## 2024-06-30 ENCOUNTER — Inpatient Hospital Stay (HOSPITAL_COMMUNITY): Payer: No Typology Code available for payment source

## 2024-06-30 DIAGNOSIS — G9340 Encephalopathy, unspecified: Secondary | ICD-10-CM | POA: Diagnosis not present

## 2024-06-30 DIAGNOSIS — E162 Hypoglycemia, unspecified: Secondary | ICD-10-CM

## 2024-06-30 DIAGNOSIS — I469 Cardiac arrest, cause unspecified: Secondary | ICD-10-CM

## 2024-06-30 DIAGNOSIS — J9601 Acute respiratory failure with hypoxia: Secondary | ICD-10-CM | POA: Diagnosis not present

## 2024-06-30 DIAGNOSIS — R569 Unspecified convulsions: Secondary | ICD-10-CM

## 2024-06-30 DIAGNOSIS — R609 Edema, unspecified: Secondary | ICD-10-CM

## 2024-06-30 DIAGNOSIS — N179 Acute kidney failure, unspecified: Secondary | ICD-10-CM | POA: Diagnosis not present

## 2024-06-30 DIAGNOSIS — K7200 Acute and subacute hepatic failure without coma: Secondary | ICD-10-CM

## 2024-06-30 LAB — ECHOCARDIOGRAM COMPLETE
Area-P 1/2: 3.6 cm2
Height: 68 in
S' Lateral: 3.2 cm
Weight: 3830.71 [oz_av]

## 2024-06-30 LAB — PROTIME-INR
INR: 1.3 — ABNORMAL HIGH (ref 0.8–1.2)
Prothrombin Time: 17.2 s — ABNORMAL HIGH (ref 11.4–15.2)

## 2024-06-30 LAB — POCT I-STAT 7, (LYTES, BLD GAS, ICA,H+H)
Acid-Base Excess: 3 mmol/L — ABNORMAL HIGH (ref 0.0–2.0)
Acid-base deficit: 1 mmol/L (ref 0.0–2.0)
Bicarbonate: 23 mmol/L (ref 20.0–28.0)
Bicarbonate: 25.8 mmol/L (ref 20.0–28.0)
Calcium, Ion: 1.11 mmol/L — ABNORMAL LOW (ref 1.15–1.40)
Calcium, Ion: 1.16 mmol/L (ref 1.15–1.40)
HCT: 39 % (ref 39.0–52.0)
HCT: 48 % (ref 39.0–52.0)
Hemoglobin: 13.3 g/dL (ref 13.0–17.0)
Hemoglobin: 16.3 g/dL (ref 13.0–17.0)
O2 Saturation: 100 %
O2 Saturation: 99 %
Patient temperature: 37
Patient temperature: 37.1
Potassium: 3.2 mmol/L — ABNORMAL LOW (ref 3.5–5.1)
Potassium: 4.2 mmol/L (ref 3.5–5.1)
Sodium: 143 mmol/L (ref 135–145)
Sodium: 144 mmol/L (ref 135–145)
TCO2: 24 mmol/L (ref 22–32)
TCO2: 27 mmol/L (ref 22–32)
pCO2 arterial: 34.6 mmHg (ref 32–48)
pCO2 arterial: 37.6 mmHg (ref 32–48)
pH, Arterial: 7.396 (ref 7.35–7.45)
pH, Arterial: 7.481 — ABNORMAL HIGH (ref 7.35–7.45)
pO2, Arterial: 134 mmHg — ABNORMAL HIGH (ref 83–108)
pO2, Arterial: 247 mmHg — ABNORMAL HIGH (ref 83–108)

## 2024-06-30 LAB — GLUCOSE, CAPILLARY
Glucose-Capillary: 106 mg/dL — ABNORMAL HIGH (ref 70–99)
Glucose-Capillary: 109 mg/dL — ABNORMAL HIGH (ref 70–99)
Glucose-Capillary: 111 mg/dL — ABNORMAL HIGH (ref 70–99)
Glucose-Capillary: 58 mg/dL — ABNORMAL LOW (ref 70–99)
Glucose-Capillary: 61 mg/dL — ABNORMAL LOW (ref 70–99)
Glucose-Capillary: 64 mg/dL — ABNORMAL LOW (ref 70–99)
Glucose-Capillary: 68 mg/dL — ABNORMAL LOW (ref 70–99)
Glucose-Capillary: 85 mg/dL (ref 70–99)
Glucose-Capillary: 86 mg/dL (ref 70–99)
Glucose-Capillary: 91 mg/dL (ref 70–99)
Glucose-Capillary: 95 mg/dL (ref 70–99)

## 2024-06-30 LAB — CBC
HCT: 46.1 % (ref 39.0–52.0)
Hemoglobin: 15.7 g/dL (ref 13.0–17.0)
MCH: 28.7 pg (ref 26.0–34.0)
MCHC: 34.1 g/dL (ref 30.0–36.0)
MCV: 84.3 fL (ref 80.0–100.0)
Platelets: 204 K/uL (ref 150–400)
RBC: 5.47 MIL/uL (ref 4.22–5.81)
RDW: 13.2 % (ref 11.5–15.5)
WBC: 16.1 K/uL — ABNORMAL HIGH (ref 4.0–10.5)
nRBC: 0 % (ref 0.0–0.2)

## 2024-06-30 LAB — HEMOGLOBIN A1C
Hgb A1c MFr Bld: 5.6 % (ref 4.8–5.6)
Mean Plasma Glucose: 114.02 mg/dL

## 2024-06-30 LAB — MAGNESIUM
Magnesium: 1.5 mg/dL — ABNORMAL LOW (ref 1.7–2.4)
Magnesium: 2.3 mg/dL (ref 1.7–2.4)

## 2024-06-30 LAB — PHOSPHORUS: Phosphorus: 1.4 mg/dL — ABNORMAL LOW (ref 2.5–4.6)

## 2024-06-30 LAB — TROPONIN I (HIGH SENSITIVITY): Troponin I (High Sensitivity): 1148 ng/L (ref <18)

## 2024-06-30 LAB — POTASSIUM: Potassium: 3.1 mmol/L — ABNORMAL LOW (ref 3.5–5.1)

## 2024-06-30 LAB — CK: Total CK: 4495 U/L — ABNORMAL HIGH (ref 49–397)

## 2024-06-30 LAB — TRIGLYCERIDES: Triglycerides: 86 mg/dL (ref <150)

## 2024-06-30 MED ORDER — POTASSIUM CHLORIDE 20 MEQ PO PACK: 40.0000 meq | PACK | Freq: Two times a day (BID) | ORAL | Status: DC

## 2024-06-30 MED ORDER — SODIUM PHOSPHATES 45 MMOLE/15ML IV SOLN
45.0000 mmol | Freq: Once | INTRAVENOUS | Status: AC
  Administered 2024-06-30: 45 mmol via INTRAVENOUS
  Filled 2024-06-30: qty 15

## 2024-06-30 MED ORDER — POTASSIUM CHLORIDE 10 MEQ/100ML IV SOLN: 10.0000 meq | INTRAVENOUS | Status: DC

## 2024-06-30 MED ORDER — POTASSIUM CHLORIDE 20 MEQ PO PACK
40.0000 meq | PACK | Freq: Two times a day (BID) | ORAL | Status: AC
  Administered 2024-06-30: 40 meq
  Filled 2024-06-30: qty 2

## 2024-06-30 MED ORDER — DEXTROSE IN LACTATED RINGERS 5 % IV SOLN: INTRAVENOUS | Status: DC

## 2024-06-30 MED ORDER — POTASSIUM CHLORIDE 10 MEQ/50ML IV SOLN
10.0000 meq | INTRAVENOUS | Status: AC
  Administered 2024-06-30 (×4): 10 meq via INTRAVENOUS
  Filled 2024-06-30 (×4): qty 50

## 2024-06-30 MED ORDER — VITAL AF 1.2 CAL PO LIQD
1000.0000 mL | ORAL | Status: DC
  Administered 2024-06-30 – 2024-07-01 (×2): 1000 mL

## 2024-06-30 MED ORDER — ADULT MULTIVITAMIN W/MINERALS CH
1.0000 | ORAL_TABLET | Freq: Every day | ORAL | Status: DC
  Administered 2024-06-30 – 2024-07-06 (×7): 1
  Filled 2024-06-30 (×7): qty 1

## 2024-06-30 MED ORDER — DEXTROSE 50 % IV SOLN
INTRAVENOUS | Status: AC
  Administered 2024-06-30: 12.5 g via INTRAVENOUS
  Filled 2024-06-30: qty 50

## 2024-06-30 MED ORDER — VITAL HP 1.0 CAL PO LIQD: 1000.0000 mL | ORAL | Status: DC

## 2024-06-30 MED ORDER — DEXTROSE 50 % IV SOLN: 12.5000 g | INTRAVENOUS | Status: AC

## 2024-06-30 MED ORDER — DEXTROSE 10 % IV SOLN: INTRAVENOUS | Status: DC

## 2024-06-30 MED ORDER — POTASSIUM CHLORIDE 10 MEQ/50ML IV SOLN
10.0000 meq | INTRAVENOUS | Status: AC
  Administered 2024-07-01 (×4): 10 meq via INTRAVENOUS
  Filled 2024-06-30 (×4): qty 50

## 2024-06-30 MED ORDER — PROSOURCE TF20 ENFIT COMPATIBL EN LIQD
60.0000 mL | Freq: Every day | ENTERAL | Status: DC
  Administered 2024-06-30 – 2024-07-03 (×4): 60 mL
  Filled 2024-06-30 (×4): qty 60

## 2024-06-30 MED ORDER — MAGNESIUM SULFATE 4 GM/100ML IV SOLN
4.0000 g | Freq: Once | INTRAVENOUS | Status: AC
  Administered 2024-06-30: 4 g via INTRAVENOUS
  Filled 2024-06-30: qty 100

## 2024-06-30 MED ORDER — POTASSIUM CHLORIDE 20 MEQ PO PACK
20.0000 meq | PACK | ORAL | Status: AC
  Administered 2024-07-01 (×2): 20 meq
  Filled 2024-06-30 (×2): qty 1

## 2024-06-30 MED ORDER — ENOXAPARIN SODIUM 100 MG/ML IJ SOSY
100.0000 mg | PREFILLED_SYRINGE | Freq: Two times a day (BID) | INTRAMUSCULAR | Status: DC
  Administered 2024-06-30 – 2024-07-01 (×2): 100 mg via SUBCUTANEOUS
  Filled 2024-06-30 (×3): qty 1

## 2024-06-30 NOTE — Progress Notes (Signed)
 Hypoglycemic Event  CBG: 60, 62  Treatment: D50 25 mL (12.5 gm)  Symptoms: None  Follow-up CBG: Time:0007 CBG Result: 91  Possible Reasons for Event: Inadequate meal intake  Comments/MD notified: Elink notified; D5LR drip started given lack of oral intake    Nat JINNY Rubin

## 2024-06-30 NOTE — Progress Notes (Addendum)
 NEUROLOGY CONSULT FOLLOW UP NOTE   Date of service: June 30, 2024 Patient Name: Mike Sellers MRN:  [id number] DOB:  11/16/1874  Interval Hx/subjective  Pt seen bedside this AM. He is intubated, not responding to sternal rub, also on sedation with propofol  and fentanyl .  Vitals   Vitals:   06/30/24 1215 06/30/24 1230 06/30/24 1235 06/30/24 1245  BP: (!) 104/53 (!) 100/58 109/68 98/63  Pulse: (!) 104 100 (!) 101 100  Resp: (!) 32 (!) 32 (!) 32 (!) 32  Temp:      TempSrc:      SpO2: 100% 100% 100% 100%  Weight:      Height:         Body mass index is 36.4 kg/m.  Physical Exam   Constitutional: Ill appearing male, intubated and sedated Eyes: No scleral injection, pupils responsive to light  Cardiovascular: Normal rate and regular rhythm.  Respiratory: Intubated  Neurologic Examination    Neuro: *Ment: Sedated. No purposeful movements *CN:     II,III: Pupils not responsive to light Not able to evaluate rest of cranial nerves as he has no purposeful movement and is not following commands *Motor:   Normal bulk. Decreased tone, no twitching. Not responding to commands (sedated) Did not appreciate any twitching or myoclonic jerks     Medications  Current Facility-Administered Medications:    0.9 %  sodium chloride  infusion, 250 mL, Intravenous, Continuous, Chand, Sudham, MD   acetaminophen  (TYLENOL ) tablet 650 mg, 650 mg, Per Tube, Q4H, 650 mg at 06/30/24 0610 **OR** acetaminophen  (TYLENOL ) 160 MG/5ML solution 650 mg, 650 mg, Per Tube, Q4H, 650 mg at 06/30/24 1345 **OR** acetaminophen  (TYLENOL ) suppository 650 mg, 650 mg, Rectal, Q4H, Chand, Sudham, MD   albuterol  (PROVENTIL ) (2.5 MG/3ML) 0.083% nebulizer solution 2.5 mg, 2.5 mg, Nebulization, Q4H PRN, Gleason, [name], PA-C   Ampicillin -Sulbactam (UNASYN ) 3 g in sodium chloride  0.9 % 100 mL IVPB, 3 g, Intravenous, Q6H, Chand, Sudham, MD, Last Rate: 200 mL/hr at 06/30/24 1156, 3 g at 06/30/24 1156   busPIRone  (BUSPAR )  tablet 30 mg, 30 mg, Per Tube, Q8H PRN **OR** busPIRone  (BUSPAR ) tablet 30 mg, 30 mg, Per Tube, Q8H PRN, Harold Scholz, MD   Chlorhexidine  Gluconate Cloth 2 % PADS 6 each, 6 each, Topical, Daily, Ilah Krabbe M, PA-C, 6 each at 06/29/24 1010   dextrose  5 % in lactated ringers  infusion, , Intravenous, Continuous, Claudene Toribio BROCKS, MD, Last Rate: 125 mL/hr at 06/30/24 1346, New Bag at 06/30/24 1346   docusate (COLACE) 50 MG/5ML liquid 100 mg, 100 mg, Per Tube, BID, Ilah, Stephanie M, PA-C, 100 mg at 06/30/24 1050   feeding supplement (PROSource TF20) liquid 60 mL, 60 mL, Per Tube, Daily, Claudene Toribio BROCKS, MD, 60 mL at 06/30/24 1050   feeding supplement (VITAL AF 1.2 CAL) liquid 1,000 mL, 1,000 mL, Per Tube, Continuous, Claudene Toribio BROCKS, MD, Last Rate: 15 mL/hr at 06/30/24 1223, 1,000 mL at 06/30/24 1223   fentaNYL  (SUBLIMAZE ) bolus via infusion 50-100 mcg, 50-100 mcg, Intravenous, Q15 min PRN, Chand, Sudham, MD, 100 mcg at 06/29/24 2325   fentaNYL  in NS (60mcg/ml) infusion-PREMIX, 50-200 mcg/hr, Intravenous, Continuous, Chand, Sudham, MD, Last Rate: 12.5 mL/hr at 06/30/24 1258, 125 mcg/hr at 06/30/24 1258   heparin  injection 5,000 Units, 5,000 Units, Subcutaneous, Q8H, Reese, Stephanie M, PA-C, 5,000 Units at 06/30/24 1343   insulin  aspart (novoLOG ) injection 0-9 Units, 0-9 Units, Subcutaneous, Q4H, Ilah Krabbe M, PA-C, 1 Units at 06/29/24 1700  levETIRAcetam  (KEPPRA ) undiluted injection 500 mg, 500 mg, Intravenous, BID, Khaliqdina, Salman, MD, 500 mg at 06/30/24 1101   magnesium  sulfate IVPB 2 g 50 mL, 2 g, Intravenous, Once PRN, Harold Scholz, MD   midazolam  (VERSED ) injection 1-2 mg, 1-2 mg, Intravenous, Q1H PRN, Ilah Krabbe M, PA-C, 2 mg at 06/29/24 1215   midazolam  (VERSED ) injection 10 mg, 10 mg, Intravenous, Once PRN, Kassie Acquanetta Bradley, MD   multivitamin with minerals tablet 1 tablet, 1 tablet, Per Tube, Daily, Claudene Toribio BROCKS, MD, 1 tablet at 06/30/24 1156    norepinephrine  (LEVOPHED ) 4mg  in (0.016 mg/mL) premix infusion, 0-40 mcg/min, Intravenous, Continuous, Tegeler, Lonni PARAS, MD, Last Rate: 3.75 mL/hr at 06/30/24 0800, 1 mcg/min at 06/30/24 0800   ondansetron  (ZOFRAN ) injection 4 mg, 4 mg, Intravenous, Q6H PRN, Ilah Krabbe HERO, PA-C   Oral care mouth rinse, 15 mL, Mouth Rinse, Q2H, Chand, Sudham, MD, 15 mL at 06/30/24 1000   Oral care mouth rinse, 15 mL, Mouth Rinse, PRN, Harold Scholz, MD   pantoprazole  (PROTONIX ) injection 40 mg, 40 mg, Intravenous, Q12H, Reese, Stephanie M, PA-C, 40 mg at 06/30/24 1101   polyethylene glycol (MIRALAX  / GLYCOLAX ) packet 17 g, 17 g, Per Tube, Daily, Ilah Krabbe M, PA-C, 17 g at 06/30/24 1050   propofol  (DIPRIVAN ) 1000 MG/100ML infusion, 40 mcg/kg/min, Intravenous, Continuous, Khaliqdina, Salman, MD, Last Rate: 26.4 mL/hr at 06/30/24 1250, 40 mcg/kg/min at 06/30/24 1250   sodium phosphate  45 mmol in sodium chloride  0.9 % 250 mL infusion, 45 mmol, Intravenous, Once, Kassie Acquanetta Bradley, MD, Last Rate: 44 mL/hr at 06/30/24 1053, IV Pump Association at 06/30/24 1053   thiamine  (VITAMIN B1) tablet 100 mg, 100 mg, Per Tube, Daily, Chand, Scholz, MD, 100 mg at 06/30/24 1050  Labs and Diagnostic Imaging   CBC:  Recent Labs  Lab 06/29/24 0825 06/29/24 0854 06/30/24 0043 06/30/24 0352  WBC 25.8*  --   --  16.1*  HGB 14.7   < > 16.3 15.7  HCT 49.2   < > 48.0 46.1  MCV 96.1  --   --  84.3  PLT 231  --   --  204   < > = values in this interval not displayed.    Basic Metabolic Panel:  Lab Results  Component Value Date   NA 143 06/30/2024   K 3.1 (L) 06/30/2024   CO2 26 06/30/2024   GLUCOSE 119 (H) 06/30/2024   BUN 18 06/30/2024   CREATININE 1.38 (H) 06/30/2024   CALCIUM  8.0 (L) 06/30/2024   GFRNONAA 34 (L) 06/30/2024   Lipid Panel: No results found for: LDLCALC HgbA1c:  Lab Results  Component Value Date   HGBA1C 5.6 06/30/2024   Urine Drug Screen:     Component Value Date/Time    LABOPIA NONE DETECTED 06/29/2024 0836   COCAINSCRNUR NONE DETECTED 06/29/2024 0836   LABBENZ NONE DETECTED 06/29/2024 0836   AMPHETMU POSITIVE (A) 06/29/2024 0836   THCU NONE DETECTED 06/29/2024 0836   LABBARB NONE DETECTED 06/29/2024 0836    Alcohol  Level     Component Value Date/Time   ETH <15 06/29/2024 0836   INR  Lab Results  Component Value Date   INR 1.3 (H) 06/30/2024   APTT  Lab Results  Component Value Date   APTT 42 (H) 06/29/2024     Assessment   [name] is a male of unknown age (identity still hasn't been found by police) who presented on 8/14 after being found outside of a public  library pulseless. He underwent 13 minutes of CPR. Neurology consulted for episodic seizure like activity. He was loaded with Keppra  followed by scheduled Keppra  at 500 mg BID. Started on long term EEG, which on most recent read shows moderate to severe diffuse encephalopathy, with no electrographic seizure or epileptiform discharges. He is currently sedated on propofol . Per CCM, he has a strong cough and triggers the vent. His eyes don't seem responsive to light on my exam, and did not appreciate any purposeful movements.   Recommendations   - Continue LTM EEG - May trial decrease of propofol  for full neuro exam - MRI brain on hospital day 3 - Prognostic exam off sedation on day 3 - Continue Keppra  500 mg BID  - Avoid hypotension, hyponatremia, hyperthermia   I personally spent a total of 40 minutes in the care of this critically ill patient today including performing a medically appropriate exam/evaluation and documenting clinical information in the EHR. Electronically signed: Dr. Camellia Shark _____________________________________________________________________   Assisting with this assessment: Saad Nooruddin, MD  Internal Medicine Resident, PGY-3

## 2024-06-30 NOTE — Progress Notes (Signed)
 [facility] ADULT ICU REPLACEMENT PROTOCOL   The patient does apply for the [facility] Adult ICU Electrolyte Replacment Protocol based on the criteria listed below:   1.Exclusion criteria: TCTS, ECMO, Dialysis, and Myasthenia Gravis patients 2. Is GFR >/= 30 ml/min? Yes.    Patient's GFR today is 34 3. Is SCr </= 2? Yes.   Patient's SCr is 1.38 mg/dL 4. Did SCr increase >/= 0.5 in 24 hours? No. 5.Pt's weight >40kg  Yes.   6. Abnormal electrolyte(s): k 3.1  7. Electrolytes replaced per protocol 8.  Call MD STAT for K+ </= 2.5, Phos </= 1, or Mag </= 1 Physician:  Claudene SHIN, Jarin Cornfield A 06/30/2024 11:36 PM

## 2024-06-30 NOTE — Procedures (Addendum)
 Patient Name: Mike Sellers  MRN: [id number]  Epilepsy Attending: Arlin MALVA Krebs  Referring Physician/Provider: Khaliqdina, Salman, MD  Duration: 06/30/2024 0015 to  07/01/2024 0015   Patient history: unknown age male s/p cardiac arrest. EEG to evaluate for seizure   Level of alertness: comatose   AEDs during EEG study: Propofol , LEV   Technical aspects: This EEG study was done with scalp electrodes positioned according to the 10-20 International system of electrode placement. Electrical activity was reviewed with band pass filter of 1-70Hz , sensitivity of 7 uV/mm, display speed of 67mm/sec with a 60Hz  notched filter applied as appropriate. EEG data were recorded continuously and digitally stored.  Video monitoring was available and reviewed as appropriate.   Description: EEG showed continuous generalized polymorphic 3 to 6 Hz theta-delta slowing. Hyperventilation and photic stimulation were not performed.     ABNORMALITY - Continuous slow, generalized  IMPRESSION: This study is suggestive of moderate to severe diffuse encephalopathy. No seizures or epileptiform discharges were seen throughout the recording.   Theran Vandergrift O Mariachristina Holle

## 2024-06-30 NOTE — Progress Notes (Signed)
 NAME:  Mike Sellers, MRN:  [id number], DOB:  11/16/1874, LOS: 1 ADMISSION DATE:  06/29/2024 CONSULTATION DATE:  06/29/2024 REFERRING MD:  Tegeler - EDP, CHIEF COMPLAINT:  Post-cardiac arrest, found down   History of Present Illness:  Unknown age man who presented to [facility] ED via EMS 8/14 after OOH cardiac arrest with unknown downtime. PMHx unknown.  Patient was reportedly found down outside of the [employer] with drug paraphernalia on site (crack pipe, lighters) with wet clothing/personal belongings. Per police, a Lexicographer reported seeing patient down around his arrival to work at 0400; at that time patient was breathing. On EMS arrival, patient was pulseless and apneic. Narcan was given without significant response. Initial rhythm was asystole and patient received CPR x 13 minutes, Epi x 2 with ROSC. Arrived to ED on Epi gtt. LMA placed. Intubated on arrival, no reported signs of responsiveness en route. On ED arrival, patient was afebrile, HR 80s, RR 25, BP 113/55 on Epi gtt, SpO2 100%. GCS 3.   Labs were notable for WBC 25.8, Hgb 14.7, Plt 231. INR 1.4. Na 145, K 3.9, CO2 16, BUN/Cr 11/2.57. Phos > 30. Mg 3.0. AST/ALT 872/1256, Alk Phos 99, Tbili 0.7. Trop 110. UA gluc 50, mod Hgb, prot 100. UDS +amphetamines. Initial CXR unremarkable. CT Head NAICA, CT C-Spine negative for acute traumatic injury.  PCCM consulted for ICU admission.  Pertinent Medical History:  History reviewed. No pertinent past medical history.  Significant Hospital Events: Including procedures, antibiotic start and stop dates in addition to other pertinent events   8/14 - Presented to [facility] ED as OOH cardiac arrest, [name] with unknown downtime. Initial rhythm asystole, CPR x 13 min, Epi x 2, Epi gtt. Intubated on ED arrival and NE started. CXR unremarkable. Labs/imaging pending. 8/15 neuro exam poor, myclonus overnight, on 1mcg norepi  Interim History / Subjective:  Myoclonus and concern for sz overnight, seen  by neuro, on cEEG and loaded with Keppra , showing epileptiform discharges  On Norepi Police have been unable to identify thus far   Objective:  Blood pressure 121/69, pulse 91, temperature 99.1 F (37.3 C), resp. rate (!) 32, height 5' 8 (1.727 m), weight 108.6 kg, SpO2 100%.    Vent Mode: PRVC FiO2 (%):  [50 %-80 %] 50 % Set Rate:  [28 bmp-500 bmp] 500 bmp Vt Set:  [500 mL] 500 mL PEEP:  [8 cmH20-10 cmH20] 8 cmH20 Plateau Pressure:  [17 cmH20-32 cmH20] 19 cmH20   Intake/Output Summary (Last 24 hours) at 06/30/2024 0920 Last data filed at 06/30/2024 9390 Gross per 24 hour  Intake 1523.31 ml  Output 1775 ml  Net -251.69 ml   Filed Weights   06/29/24 0812 06/30/24 0500  Weight: 110 kg 108.6 kg   General:  well nourished M, suspect in the 30's intubated and sedated HEENT: MM pink/moist, sclera anicteric Neuro: pupils equal and responsive, examined on propofol , withdraws wot pain  CV: s1s2 rrr, no m/r/g PULM:  clear bilaterally on PRVC 50% PEEP 8 GI: soft, non-distended  Extremities: warm/dry, no edema    Labs: LFT's stable AST 840, ALT 1408 Phos 1.4 Mat 1.5 WBC 16   Resolved Hospital Problem List:    Assessment & Plan:  Post-cardiac arrest, out of hospital with unknown downtime Undifferentiated shock, cardiogenic versus septic component Hypoglycemia Suspect hypoxic arrest in the setting of drug use, ?aspiration. - continue  Targeted temperature management/normothermia protocol - Goal MAP > 65, now off  - Fluid resuscitation as tolerated -  Levophed  titrated to goal MAP, now off  - - Trend troponins - Echo with EF 55-60%, RV systolic function mild to moderately reduced with mild RV enlargement, small pericardial effusion  -trop 110 yesterday, trend today -continue Unasyn  and following culture data -unknown down time, check CK and IVF -D5 and start TF  Acute hypoxemic respiratory failure in the setting of cardiac arrest Concern for aspiration PNA -  Continue full vent support (4-8cc/kg IBW) - Wean FiO2 for O2 sat > 90% - Daily WUA/SBT once appropriate, not clinically appropriate at this time due to poor mental status and concern for sx - VAP bundle - Pulmonary hygiene - PAD protocol for sedation: Propofol  and Fentanyl  for goal RASS 0 to -1; off sedation at present and remains unresponsive - Follow CXR, ABG - Unasyn  for aspiration coverage  AKI, likely ATN in the setting of cardiac arrest/shock Hypomagnesemia Hypophosphatemia  Creatinine improving, 1.6 from 2.5 - Trend BMP - Replete electrolytes as indicated - Monitor I&Os - Avoid nephrotoxic agents as able - Ensure adequate renal perfusion  Shock liver - Trend LFTs daily  Substance abuse Received Narcan with EMS with no response. - UDS +amphetamines - Police involved for identification purposes - TOC consult to help address substance abuse/?unhoused needs if clinical status improves  Best Practice: (right click and Reselect all SmartList Selections daily)   Diet/type: tubefeeds DVT prophylaxis: SCDs, SQH GI prophylaxis: PPI Lines: Central line and Arterial Line Foley:  Yes, and it is still needed Code Status:  full code Last date of multidisciplinary goals of care discussion [Pending, unable to identify pt]  Labs:  CBC: Recent Labs  Lab 06/29/24 0825 06/29/24 0854 06/29/24 1300 06/29/24 1702 06/30/24 0043 06/30/24 0352  WBC 25.8*  --   --   --   --  16.1*  HGB 14.7 15.6 17.7* 17.0 16.3 15.7  HCT 49.2 46.0 52.0 50.0 48.0 46.1  MCV 96.1  --   --   --   --  84.3  PLT 231  --   --   --   --  204   Basic Metabolic Panel: Recent Labs  Lab 06/29/24 0820 06/29/24 0825 06/29/24 0854 06/29/24 1300 06/29/24 1446 06/29/24 1702 06/30/24 0043 06/30/24 0352  NA 144 145   < > 141 139 143 143 142  K 3.7 3.9   < > 6.8* 6.3* 4.7 4.2 3.7  CL 109 108  --   --  107  --   --  107  CO2  --  16*  --   --  21*  --   --  23  GLUCOSE 121* 123*  --   --  145*  --   --   136*  BUN 14 11  --   --  17  --   --  20  CREATININE 2.30* 2.57*  --   --  1.87*  --   --  1.60*  CALCIUM   --  8.2*  --   --  7.0*  --   --  8.2*  MG  --  3.0*  --   --   --   --   --  1.5*  PHOS  --  >30.0*  --   --   --   --   --  1.4*   < > = values in this interval not displayed.   GFR: Estimated Creatinine Clearance: -5.9 mL/min (A) (by C-G formula based on SCr of 1.6 mg/dL (H)). Recent Labs  Lab  06/29/24 0820 06/29/24 0825 06/29/24 1341 06/29/24 1558 06/30/24 0352  WBC  --  25.8*  --   --  16.1*  LATICACIDVEN 13.4*  --  3.7* 2.9*  --    Liver Function Tests: Recent Labs  Lab 06/29/24 0825 06/30/24 0352  AST 872* 840*  ALT 1,256* 1,408*  ALKPHOS 99 64  BILITOT 0.7 0.8  PROT 5.2* 5.1*  ALBUMIN  3.0* 2.7*   No results for input(s): LIPASE, AMYLASE in the last 168 hours. No results for input(s): AMMONIA in the last 168 hours.  ABG:    Component Value Date/Time   PHART 7.396 06/30/2024 0043   PCO2ART 37.6 06/30/2024 0043   PO2ART 247 (H) 06/30/2024 0043   HCO3 23.0 06/30/2024 0043   TCO2 24 06/30/2024 0043   ACIDBASEDEF 1.0 06/30/2024 0043   O2SAT 100 06/30/2024 0043    Coagulation Profile: Recent Labs  Lab 06/29/24 0825 06/30/24 0352  INR 1.4* 1.3*   Cardiac Enzymes: No results for input(s): CKTOTAL, CKMB, CKMBINDEX, TROPONINI in the last 168 hours.  HbA1C: Hgb A1c MFr Bld  Date/Time Value Ref Range Status  06/30/2024 03:52 AM 5.6 4.8 - 5.6 % Final    Comment:    (NOTE) Diagnosis of Diabetes The following HbA1c ranges recommended by the American Diabetes Association (ADA) may be used as an aid in the diagnosis of diabetes mellitus.  Hemoglobin             Suggested A1C NGSP%              Diagnosis  <5.7                   Non Diabetic  5.7-6.4                Pre-Diabetic  >6.4                   Diabetic  <7.0                   Glycemic control for                       adults with diabetes.      CBG: Recent Labs  Lab  06/29/24 2331 06/30/24 0007 06/30/24 0343 06/30/24 0821 06/30/24 0851  GLUCAP 62* 91 86 58* 64*   Review of Systems:   Patient is encephalopathic and/or intubated; therefore, history has been obtained from chart review.   Past Medical History:  He,  has no past medical history on file.   Surgical History:  History reviewed. No pertinent surgical history.   Social History:   reports current drug use.   Family History:  His family history is not on file.   Allergies: Not on File   Home Medications: Prior to Admission medications   Not on File    Critical care time: 40 mins    CRITICAL CARE Performed by: [name] Evona Westra   Total critical care time: 40 minutes  Critical care time was exclusive of separately billable procedures and treating other patients.  Critical care was necessary to treat or prevent imminent or life-threatening deterioration.  Critical care was time spent personally by me on the following activities: development of treatment plan with patient and/or surrogate as well as nursing, discussions with consultants, evaluation of patient's response to treatment, examination of patient, obtaining history from patient or surrogate, ordering and performing treatments and interventions, ordering and review of laboratory studies, ordering and  review of radiographic studies, pulse oximetry and re-evaluation of patient's condition.   [name] Alvie Fowles, PA-C [facility] See Amion for pager If no response to pager , please call [phone] until 7pm After 7:00 pm call Elink  [phone]

## 2024-06-30 NOTE — Plan of Care (Signed)

## 2024-06-30 NOTE — Progress Notes (Signed)
 LTM EEG hooked up and running - no initial skin breakdown - push button tested - [facility] monitoring.

## 2024-06-30 NOTE — Progress Notes (Signed)
  Echocardiogram 2D Echocardiogram has been performed.  Mike Sellers 06/30/2024, 8:42 AM

## 2024-06-30 NOTE — Progress Notes (Signed)
 VASCULAR LAB   Bilateral lower extremity venous duplex has been performed.  See CV proc for preliminary results.   Jadon Harbaugh, RVT 06/30/2024, 5:34 PM

## 2024-06-30 NOTE — Progress Notes (Signed)
 Nutrition Brief Note  Consult received for initiation and management of enteral nutrition; trickle tube feeds. Discussed with CCM MD who is amenable to titration.   Full RD assessment completed 8/14.  Tube feeding recommendations: Vital AF 1.2 at 65 ml/hr Begin at 20 ml/hr with titration orders to goal TF goal provides 1872 kcals, 117 g of protein, 1264 mL of free water   Labs reviewed: Potassium 3.7 (WDL) Phosphorus 1.4 -repletion ordered Magnesium  1.5 - repletion ordered  Medications: Levo @ 1 mcg/min Propofol  @ 26.4 ml/hr  Still has not had a BM but continues on bowel regimen. Will monitor.   Will place order for slow titration given significantly low lytes.  Thiamine  ordered to begin yesterday 8/14.  Mike Sellers, RDN, LDN Clinical Nutrition See AMiON for contact information.

## 2024-06-30 NOTE — Plan of Care (Signed)
   Problem: Fluid Volume: Goal: Ability to maintain a balanced intake and output will improve Outcome: Progressing   Problem: Skin Integrity: Goal: Risk for impaired skin integrity will decrease Outcome: Progressing   Problem: Tissue Perfusion: Goal: Adequacy of tissue perfusion will improve Outcome: Progressing

## 2024-06-30 NOTE — Progress Notes (Signed)
 [facility] ADULT ICU REPLACEMENT PROTOCOL   The patient does apply for the [facility] Adult ICU Electrolyte Replacment Protocol based on the criteria listed below:   1.Exclusion criteria: TCTS, ECMO, Dialysis, and Myasthenia Gravis patients 2. Is GFR >/= 30 ml/min? Yes.    Patient's GFR today is 28 3. Is SCr </= 2? Yes.   Patient's SCr is 1.60 mg/dL 4. Did SCr increase >/= 0.5 in 24 hours? No. 5.Pt's weight >40kg  Yes.   6. Abnormal electrolyte(s): mag 1.5, phos 1.4, potassium 3.7  7. Electrolytes replaced per protocol 8.  Call MD STAT for K+ </= 2.5, Phos </= 1, or Mag </= 1 Physician:  protocol  Claretta JINNY Sharps 06/30/2024 5:41 AM

## 2024-07-01 ENCOUNTER — Inpatient Hospital Stay (HOSPITAL_COMMUNITY): Payer: No Typology Code available for payment source

## 2024-07-01 DIAGNOSIS — J9601 Acute respiratory failure with hypoxia: Secondary | ICD-10-CM | POA: Diagnosis not present

## 2024-07-01 DIAGNOSIS — G931 Anoxic brain damage, not elsewhere classified: Secondary | ICD-10-CM | POA: Diagnosis not present

## 2024-07-01 DIAGNOSIS — N179 Acute kidney failure, unspecified: Secondary | ICD-10-CM | POA: Diagnosis not present

## 2024-07-01 DIAGNOSIS — G9340 Encephalopathy, unspecified: Secondary | ICD-10-CM | POA: Diagnosis not present

## 2024-07-01 DIAGNOSIS — I469 Cardiac arrest, cause unspecified: Secondary | ICD-10-CM | POA: Diagnosis not present

## 2024-07-01 DIAGNOSIS — R569 Unspecified convulsions: Secondary | ICD-10-CM | POA: Diagnosis not present

## 2024-07-01 DIAGNOSIS — E162 Hypoglycemia, unspecified: Secondary | ICD-10-CM | POA: Diagnosis not present

## 2024-07-01 LAB — CBC
HCT: 44.3 % (ref 39.0–52.0)
Hemoglobin: 14.6 g/dL (ref 13.0–17.0)
MCH: 28.7 pg (ref 26.0–34.0)
MCHC: 33 g/dL (ref 30.0–36.0)
MCV: 87 fL (ref 80.0–100.0)
Platelets: 158 K/uL (ref 150–400)
RBC: 5.09 MIL/uL (ref 4.22–5.81)
RDW: 13.7 % (ref 11.5–15.5)
WBC: 12.4 K/uL — ABNORMAL HIGH (ref 4.0–10.5)
nRBC: 0 % (ref 0.0–0.2)

## 2024-07-01 LAB — HEPATIC FUNCTION PANEL
ALT: 1069 U/L — ABNORMAL HIGH (ref 0–44)
AST: 458 U/L — ABNORMAL HIGH (ref 15–41)
Albumin: 2.6 g/dL — ABNORMAL LOW (ref 3.5–5.0)
Alkaline Phosphatase: 61 U/L (ref 38–126)
Bilirubin, Direct: 0.2 mg/dL (ref 0.0–0.2)
Indirect Bilirubin: 0.5 mg/dL (ref 0.3–0.9)
Total Bilirubin: 0.7 mg/dL (ref 0.0–1.2)
Total Protein: 5.2 g/dL — ABNORMAL LOW (ref 6.5–8.1)

## 2024-07-01 LAB — GLUCOSE, CAPILLARY
Glucose-Capillary: 72 mg/dL (ref 70–99)
Glucose-Capillary: 68 mg/dL — ABNORMAL LOW (ref 70–99)
Glucose-Capillary: 118 mg/dL — ABNORMAL HIGH (ref 70–99)
Glucose-Capillary: 100 mg/dL — ABNORMAL HIGH (ref 70–99)
Glucose-Capillary: 55 mg/dL — ABNORMAL LOW (ref 70–99)
Glucose-Capillary: 104 mg/dL — ABNORMAL HIGH (ref 70–99)
Glucose-Capillary: 106 mg/dL — ABNORMAL HIGH (ref 70–99)

## 2024-07-01 LAB — AMMONIA: Ammonia: 36 umol/L — ABNORMAL HIGH (ref 9–35)

## 2024-07-01 LAB — CORTISOL: Cortisol, Plasma: 4.3 ug/dL

## 2024-07-01 LAB — MAGNESIUM: Magnesium: 1.9 mg/dL (ref 1.7–2.4)

## 2024-07-01 LAB — PHOSPHORUS: Phosphorus: 3.2 mg/dL (ref 2.5–4.6)

## 2024-07-01 MED ORDER — DEXTROSE IN LACTATED RINGERS 5 % IV SOLN: INTRAVENOUS | Status: AC

## 2024-07-01 MED ORDER — SODIUM CHLORIDE 0.9 % IV SOLN
2.0000 g | INTRAVENOUS | Status: AC
  Administered 2024-07-01 – 2024-07-05 (×5): 2 g via INTRAVENOUS
  Filled 2024-07-01 (×5): qty 20

## 2024-07-01 MED ORDER — DEXTROSE 50 % IV SOLN
INTRAVENOUS | Status: AC
  Administered 2024-07-01: 12.5 g via INTRAVENOUS
  Filled 2024-07-01: qty 50

## 2024-07-01 MED ORDER — HYDROCORTISONE SOD SUC (PF) 100 MG IJ SOLR
100.0000 mg | Freq: Two times a day (BID) | INTRAMUSCULAR | Status: DC
  Administered 2024-07-01 – 2024-07-03 (×4): 100 mg via INTRAVENOUS
  Filled 2024-07-01 (×4): qty 2

## 2024-07-01 MED ORDER — ENOXAPARIN SODIUM 60 MG/0.6ML IJ SOSY
50.0000 mg | PREFILLED_SYRINGE | INTRAMUSCULAR | Status: DC
  Administered 2024-07-02 – 2024-07-06 (×5): 50 mg via SUBCUTANEOUS
  Filled 2024-07-01 (×6): qty 0.6

## 2024-07-01 MED ORDER — DEXTROSE 50 % IV SOLN: 12.5000 g | INTRAVENOUS | Status: AC

## 2024-07-01 MED ORDER — DEXTROSE 50 % IV SOLN
12.5000 g | INTRAVENOUS | Status: AC
  Administered 2024-07-01: 12.5 g via INTRAVENOUS
  Filled 2024-07-01: qty 50

## 2024-07-01 NOTE — Progress Notes (Signed)
 NEUROLOGY CONSULT FOLLOW UP NOTE   Date of service: July 01, 2024 Patient Name: Mike Sellers MRN:  [id number] DOB:  11/16/1874  Interval Hx/subjective  Intubated and sedated on fentanyl  and propofol .   Vitals   Vitals:   07/01/24 0900 07/01/24 1000 07/01/24 1010 07/01/24 1109  BP: 117/71 114/60    Pulse: 91 83 85 91  Resp: (!) 28 (!) 28 (!) 32 (!) 32  Temp: 98.6 F (37 C) 98.1 F (36.7 C) 98.1 F (36.7 C) (!) 97.5 F (36.4 C)  TempSrc:      SpO2: 100% 100% 100% 100%  Weight:      Height:         Body mass index is 36.44 kg/m.  Physical Exam   Constitutional: Appears well-developed and well-nourished.  Eyes: No scleral injection.  Head: Normocephalic.  Respiratory: Intubated Skin: Cooling pads applied  Neurologic Examination   Ment: GCS 3t in the context of intubation and sedation with propofol  at a rate of 40 and fentanyl  at a rate of 125 CN: Pupils 2 mm and unreactive, slightly irregular. No blink to threat. Eyes midline with no spontaneous movement or nystagmus. Absent oculocephalic reflex. Corneal reflexes absent. No startle or other response to loud clapping. No cough or gag reflex (sedated) Motor/Sensory: Flaccid tone x 4. No posturing noted. No movement to noxious Reflexes: Brisk, low amplitude patellars. Toes mute bilaterally Cerebellar/Gait: Unable to assess  Medications  Current Facility-Administered Medications:    albuterol  (PROVENTIL ) (2.5 MG/3ML) 0.083% nebulizer solution 2.5 mg, 2.5 mg, Nebulization, Q4H PRN, Gleason, [name], PA-C   cefTRIAXone  (ROCEPHIN ) 2 g in sodium chloride  0.9 % 100 mL IVPB, 2 g, Intravenous, Q24H, Claudene Toribio BROCKS, MD, Last Rate: 200 mL/hr at 07/01/24 1018, 2 g at 07/01/24 1018   Chlorhexidine  Gluconate Cloth 2 % PADS 6 each, 6 each, Topical, Daily, Ilah Krabbe M, PA-C, 6 each at 06/30/24 1000   dextrose  5 % in lactated ringers  infusion, , Intravenous, Continuous, Claudene Toribio BROCKS, MD, Last Rate: 125 mL/hr at 07/01/24  1015, Continued from Pre-op at 07/01/24 1015   docusate (COLACE) 50 MG/5ML liquid 100 mg, 100 mg, Per Tube, BID, Ilah, Stephanie M, PA-C, 100 mg at 07/01/24 1016   [START ON 07/02/2024] enoxaparin  (LOVENOX ) injection 50 mg, 50 mg, Subcutaneous, Q24H, Claudene Toribio BROCKS, MD   feeding supplement (PROSource TF20) liquid 60 mL, 60 mL, Per Tube, Daily, Claudene Toribio BROCKS, MD, 60 mL at 07/01/24 1014   feeding supplement (VITAL AF 1.2 CAL) liquid 1,000 mL, 1,000 mL, Per Tube, Continuous, Claudene Toribio BROCKS, MD, Last Rate: 30 mL/hr at 07/01/24 1000, Infusion Verify at 07/01/24 1000   fentaNYL  (SUBLIMAZE ) bolus via infusion 50-100 mcg, 50-100 mcg, Intravenous, Q15 min PRN, Harold Scholz, MD, 100 mcg at 07/01/24 0533   fentaNYL  in NS (37mcg/ml) infusion-PREMIX, 50-200 mcg/hr, Intravenous, Continuous, Claudene Toribio BROCKS, MD, Last Rate: 12.5 mL/hr at 07/01/24 1011, 125 mcg/hr at 07/01/24 1011   hydrocortisone  sodium succinate  (SOLU-CORTEF ) 100 MG injection 100 mg, 100 mg, Intravenous, Q12H, Claudene Toribio BROCKS, MD   insulin  aspart (novoLOG ) injection 0-9 Units, 0-9 Units, Subcutaneous, Q4H, Reese, Stephanie M, PA-C, 1 Units at 06/29/24 1700   levETIRAcetam  (KEPPRA ) undiluted injection 500 mg, 500 mg, Intravenous, BID, Khaliqdina, Salman, MD, 500 mg at 07/01/24 1014   midazolam  (VERSED ) injection 1-2 mg, 1-2 mg, Intravenous, Q1H PRN, Ilah Krabbe M, PA-C, 2 mg at 07/01/24 0235   midazolam  (VERSED ) injection 10 mg, 10 mg, Intravenous, Once PRN, Kassie,  Acquanetta Bradley, MD   multivitamin with minerals tablet 1 tablet, 1 tablet, Per Tube, Daily, Claudene Toribio BROCKS, MD, 1 tablet at 07/01/24 1014   norepinephrine  (LEVOPHED ) 4mg  in (0.016 mg/mL) premix infusion, 0-40 mcg/min, Intravenous, Continuous, Tegeler, Lonni PARAS, MD, Stopped at 06/30/24 2231   ondansetron  (ZOFRAN ) injection 4 mg, 4 mg, Intravenous, Q6H PRN, Ilah Corean HERO, PA-C   Oral care mouth rinse, 15 mL, Mouth Rinse, Q2H, Chand, Sudham, MD, 15 mL at  07/01/24 1144   Oral care mouth rinse, 15 mL, Mouth Rinse, PRN, Harold Scholz, MD   pantoprazole  (PROTONIX ) injection 40 mg, 40 mg, Intravenous, Q12H, Ilah Corean M, PA-C, 40 mg at 07/01/24 1013   polyethylene glycol (MIRALAX  / GLYCOLAX ) packet 17 g, 17 g, Per Tube, Daily, Ilah Corean M, PA-C, 17 g at 07/01/24 1014   propofol  (DIPRIVAN ) 1000 MG/100ML infusion, 40 mcg/kg/min, Intravenous, Continuous, Vanessa Robert, MD, Last Rate: 26.4 mL/hr at 07/01/24 1123, 40 mcg/kg/min at 07/01/24 1123   thiamine  (VITAMIN B1) tablet 100 mg, 100 mg, Per Tube, Daily, Chand, Scholz, MD, 100 mg at 07/01/24 1014  Labs and Diagnostic Imaging   CBC:  Recent Labs  Lab 06/30/24 0352 06/30/24 1459 07/01/24 0348  WBC 16.1*  --  12.4*  HGB 15.7 13.3 14.6  HCT 46.1 39.0 44.3  MCV 84.3  --  87.0  PLT 204  --  158    Basic Metabolic Panel:  Lab Results  Component Value Date   NA 141 07/01/2024   K 4.2 07/01/2024   CO2 26 07/01/2024   GLUCOSE 87 07/01/2024   BUN 17 07/01/2024   CREATININE 1.04 07/01/2024   CALCIUM  8.0 (L) 07/01/2024   GFRNONAA 48 (L) 07/01/2024   Lipid Panel: No results found for: LDLCALC HgbA1c:  Lab Results  Component Value Date   HGBA1C 5.6 06/30/2024   Urine Drug Screen:     Component Value Date/Time   LABOPIA NONE DETECTED 06/29/2024 0836   COCAINSCRNUR NONE DETECTED 06/29/2024 0836   LABBENZ NONE DETECTED 06/29/2024 0836   AMPHETMU POSITIVE (A) 06/29/2024 0836   THCU NONE DETECTED 06/29/2024 0836   LABBARB NONE DETECTED 06/29/2024 0836    Alcohol  Level     Component Value Date/Time   ETH <15 06/29/2024 0836   INR  Lab Results  Component Value Date   INR 1.3 (H) 06/30/2024   APTT  Lab Results  Component Value Date   APTT 42 (H) 06/29/2024     Assessment  Mike Sellers is a male of unknown age (identity still hasn't been found by police) who presented on 8/14 after being found outside of a [employer] pulseless. He underwent 13 minutes of  CPR. Neurology consulted for episodic seizure like activity. He was loaded with Keppra  followed by scheduled Keppra  at 500 mg BID. Started on long term EEG, which on most recent read shows moderate to severe diffuse encephalopathy, with no electrographic seizure or epileptiform discharges. He is currently sedated on propofol . Per CCM, he previously had a strong cough and triggered the vent.  - Exam reveals no responses to external stimuli and absent brainstem reflexes in the context of heavy IV sedation - LTM EEG report for today (07/01/2024 0015 to  07/01/2024 0730): Continuous slow, generalized. This study is suggestive of moderate diffuse encephalopathy. No seizures or epileptiform discharges were seen throughout the recording. Event button was pressed on 07/01/2024 at 0338 for shivering like movement without concomitant eeg change. This was not an epileptic event.  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  ______________________________________________________________________   Bonney SHARK, Merlyn Bollen, MD [facility]

## 2024-07-01 NOTE — Plan of Care (Signed)
  Problem: Fluid Volume: Goal: Ability to maintain a balanced intake and output will improve Outcome: Progressing   Problem: Metabolic: Goal: Ability to maintain appropriate glucose levels will improve Outcome: Progressing   Problem: Nutritional: Goal: Maintenance of adequate nutrition will improve Outcome: Progressing Goal: Progress toward achieving an optimal weight will improve Outcome: Progressing   Problem: Tissue Perfusion: Goal: Adequacy of tissue perfusion will improve Outcome: Progressing   Problem: Skin Integrity: Goal: Risk for impaired skin integrity will decrease Outcome: Progressing

## 2024-07-01 NOTE — Progress Notes (Signed)
 LTM maint complete - no skin breakdown under:  Fp1,Fp2

## 2024-07-01 NOTE — Progress Notes (Signed)
 eLink Physician-Brief Progress Note Patient Name: Mike Sellers DOB: 11/16/1874 MRN: [id number]   Date of Service  07/01/2024  HPI/Events of Note  Fever of 102 notified. TTM pads off since today. LF Ttoo high for tylenol . D5LR order also expiring.   eICU Interventions  Cooling blanket Continue D5 LR for now at 75 cc/hr     Intervention Category Major Interventions: Respiratory failure - evaluation and management  Nilay Mangrum G Maryan Sivak 07/01/2024, 8:48 PM

## 2024-07-01 NOTE — Progress Notes (Signed)
 NAME:  Rebecca Nan Pert, MRN:  [id number], DOB:  11/16/1874, LOS: 2 ADMISSION DATE:  06/29/2024 CONSULTATION DATE:  06/29/2024 REFERRING MD:  Tegeler - EDP, CHIEF COMPLAINT:  Post-cardiac arrest, found down   History of Present Illness:  Unknown age man who presented to [facility] ED via EMS 8/14 after OOH cardiac arrest with unknown downtime. PMHx unknown.  Patient was reportedly found down outside of the [employer] with drug paraphernalia on site (crack pipe, lighters) with wet clothing/personal belongings. Per police, a Lexicographer reported seeing patient down around his arrival to work at 0400; at that time patient was breathing. On EMS arrival, patient was pulseless and apneic. Narcan was given without significant response. Initial rhythm was asystole and patient received CPR x 13 minutes, Epi x 2 with ROSC. Arrived to ED on Epi gtt. LMA placed. Intubated on arrival, no reported signs of responsiveness en route. On ED arrival, patient was afebrile, HR 80s, RR 25, BP 113/55 on Epi gtt, SpO2 100%. GCS 3.   Labs were notable for WBC 25.8, Hgb 14.7, Plt 231. INR 1.4. Na 145, K 3.9, CO2 16, BUN/Cr 11/2.57. Phos > 30. Mg 3.0. AST/ALT 872/1256, Alk Phos 99, Tbili 0.7. Trop 110. UA gluc 50, mod Hgb, prot 100. UDS +amphetamines. Initial CXR unremarkable. CT Head NAICA, CT C-Spine negative for acute traumatic injury.  PCCM consulted for ICU admission.  Pertinent Medical History:  History reviewed. No pertinent past medical history.  Significant Hospital Events: Including procedures, antibiotic start and stop dates in addition to other pertinent events   8/14 - Presented to [facility] ED as OOH cardiac arrest, [name] with unknown downtime. Initial rhythm asystole, CPR x 13 min, Epi x 2, Epi gtt. Intubated on ED arrival and NE started. CXR unremarkable. Labs/imaging pending. 8/15 neuro exam poor, myclonus overnight, on 1mcg norepi  Interim History / Subjective:  No events, still shivering.  Objective:   Blood pressure 116/60, pulse 90, temperature 98.8 F (37.1 C), resp. rate (!) 32, height 5' 8 (1.727 m), weight 108.7 kg, SpO2 100%.    Vent Mode: PRVC FiO2 (%):  [40 %] 40 % Set Rate:  [32 bmp] 32 bmp Vt Set:  [500 mL] 500 mL PEEP:  [8 cmH20] 8 cmH20 Plateau Pressure:  [18 cmH20-21 cmH20] 21 cmH20   Intake/Output Summary (Last 24 hours) at 07/01/2024 0949 Last data filed at 07/01/2024 0900 Gross per 24 hour  Intake 4782.93 ml  Output 1925 ml  Net 2857.93 ml   Filed Weights   06/29/24 0812 06/30/24 0500 07/01/24 0457  Weight: 110 kg 108.6 kg 108.7 kg   Unresponsive on vent Brainstem reflexes minimal on current sedation level, pupils pinpoint Occasioanl shivering Abd soft, hypoactive BS Ext trace edema Lungs with mild rhonci  ALI improving CXR some LL airspace disease  Assessment & Plan:  [facility] Post arrest respiratory failure with aspiration pneumonitis Post arrest acute kidney and liver injury- multifactorial ATN from arrest and rhabdo; improving with hydration Methamphetamine + Unknown medical history Post arrest encephalopathy- with concern for an; anoxic brain injury, on TTM; some possible seizure activity now on LTVEEG and keppra  Type II NSTEMI RV dysfunction- seems more related to hypoxemia, LE duplex neg; not the degree one would see with massive PE; could consider CTA down the line if RV dysfunction continues  - Continue hydration - Abx to ceftriaxone  x 5 additional days - Prop per neuro, okay to titrate up on fent for shivering; already on usual counter-shiver rx and non  rx measures - AC back to normal DVT ppx given neg duplex - Likely MRI tomorrow and multimodal prognostication; still working on trying to figure out identity - Okay to increase TF to goal, can take out D5 at some point from IVF once CBG are better  38 min cc time Rolan Sharps MD PCCM

## 2024-07-01 NOTE — Procedures (Addendum)
 Patient Name: Rebecca Nan Pert  MRN: [id number]  Epilepsy Attending: Arlin MALVA Krebs  Referring Physician/Provider: Khaliqdina, Salman, MD  Duration: 07/01/2024 0015 to  07/02/2024 0600   Patient history: unknown age male s/p cardiac arrest. EEG to evaluate for seizure   Level of alertness: comatose   AEDs during EEG study: Propofol , LEV   Technical aspects: This EEG study was done with scalp electrodes positioned according to the 10-20 International system of electrode placement. Electrical activity was reviewed with band pass filter of 1-70Hz , sensitivity of 7 uV/mm, display speed of 36mm/sec with a 60Hz  notched filter applied as appropriate. EEG data were recorded continuously and digitally stored.  Video monitoring was available and reviewed as appropriate.   Description: EEG showed continuous generalized 5-8hz  theta-alpha activity admixed with 2-3hz  delta slowing. Hyperventilation and photic stimulation were not performed.     Event button was pressed on 07/01/2024 at 0338 for shivering like movements. Concomitant eeg before, during and after the event didn't show any eeg change to suggest seizure.  EEG was disconnected between 07/01/2024 0730 to 1308 due to technical difficulty   ABNORMALITY - Continuous slow, generalized   IMPRESSION: This study is suggestive of moderate diffuse encephalopathy. No seizures or epileptiform discharges were seen throughout the recording.  Event button was pressed on 07/01/2024 at 0338 for shivering like movement without concomitant eeg change. This was not an epileptic event.    Mena Simonis O Tameka Hoiland

## 2024-07-02 ENCOUNTER — Inpatient Hospital Stay (HOSPITAL_COMMUNITY): Payer: No Typology Code available for payment source

## 2024-07-02 DIAGNOSIS — R569 Unspecified convulsions: Secondary | ICD-10-CM | POA: Diagnosis not present

## 2024-07-02 DIAGNOSIS — N179 Acute kidney failure, unspecified: Secondary | ICD-10-CM | POA: Diagnosis not present

## 2024-07-02 DIAGNOSIS — E162 Hypoglycemia, unspecified: Secondary | ICD-10-CM | POA: Diagnosis not present

## 2024-07-02 DIAGNOSIS — I469 Cardiac arrest, cause unspecified: Secondary | ICD-10-CM | POA: Diagnosis not present

## 2024-07-02 DIAGNOSIS — G931 Anoxic brain damage, not elsewhere classified: Secondary | ICD-10-CM | POA: Diagnosis not present

## 2024-07-02 DIAGNOSIS — J9601 Acute respiratory failure with hypoxia: Secondary | ICD-10-CM | POA: Diagnosis not present

## 2024-07-02 LAB — HEPATIC FUNCTION PANEL
ALT: 691 U/L — ABNORMAL HIGH (ref 0–44)
AST: 270 U/L — ABNORMAL HIGH (ref 15–41)
Albumin: 2.4 g/dL — ABNORMAL LOW (ref 3.5–5.0)
Alkaline Phosphatase: 66 U/L (ref 38–126)
Bilirubin, Direct: 0.2 mg/dL (ref 0.0–0.2)
Indirect Bilirubin: 0.5 mg/dL (ref 0.3–0.9)
Total Bilirubin: 0.7 mg/dL (ref 0.0–1.2)
Total Protein: 5.3 g/dL — ABNORMAL LOW (ref 6.5–8.1)

## 2024-07-02 LAB — CBC
HCT: 41.2 % (ref 39.0–52.0)
Hemoglobin: 13.5 g/dL (ref 13.0–17.0)
MCH: 28.5 pg (ref 26.0–34.0)
MCHC: 32.8 g/dL (ref 30.0–36.0)
MCV: 87.1 fL (ref 80.0–100.0)
Platelets: 166 K/uL (ref 150–400)
RBC: 4.73 MIL/uL (ref 4.22–5.81)
RDW: 13.3 % (ref 11.5–15.5)
WBC: 11.3 K/uL — ABNORMAL HIGH (ref 4.0–10.5)
nRBC: 0 % (ref 0.0–0.2)

## 2024-07-02 LAB — GLUCOSE, CAPILLARY
Glucose-Capillary: 101 mg/dL — ABNORMAL HIGH (ref 70–99)
Glucose-Capillary: 132 mg/dL — ABNORMAL HIGH (ref 70–99)
Glucose-Capillary: 90 mg/dL (ref 70–99)
Glucose-Capillary: 81 mg/dL (ref 70–99)
Glucose-Capillary: 92 mg/dL (ref 70–99)
Glucose-Capillary: 92 mg/dL (ref 70–99)
Glucose-Capillary: 77 mg/dL (ref 70–99)

## 2024-07-02 LAB — MAGNESIUM: Magnesium: 1.7 mg/dL (ref 1.7–2.4)

## 2024-07-02 LAB — PHOSPHORUS: Phosphorus: 2.4 mg/dL — ABNORMAL LOW (ref 2.5–4.6)

## 2024-07-02 MED ORDER — PANTOPRAZOLE SODIUM 40 MG IV SOLR
40.0000 mg | Freq: Every day | INTRAVENOUS | Status: DC
  Administered 2024-07-03 – 2024-07-05 (×3): 40 mg via INTRAVENOUS
  Filled 2024-07-02 (×3): qty 10

## 2024-07-02 MED ORDER — DEXTROSE 10 % IV SOLN: INTRAVENOUS | Status: DC

## 2024-07-02 MED ORDER — MAGNESIUM SULFATE 2 GM/50ML IV SOLN
2.0000 g | Freq: Once | INTRAVENOUS | Status: AC
  Administered 2024-07-02: 2 g via INTRAVENOUS
  Filled 2024-07-02: qty 50

## 2024-07-02 MED ORDER — POTASSIUM & SODIUM PHOSPHATES 280-160-250 MG PO PACK
1.0000 | PACK | Freq: Three times a day (TID) | ORAL | Status: AC
  Administered 2024-07-02 (×3): 1
  Filled 2024-07-02 (×3): qty 1

## 2024-07-02 MED ORDER — METOCLOPRAMIDE HCL 5 MG/ML IJ SOLN
10.0000 mg | Freq: Three times a day (TID) | INTRAMUSCULAR | Status: AC
  Administered 2024-07-02 – 2024-07-04 (×8): 10 mg via INTRAVENOUS
  Filled 2024-07-02 (×8): qty 2

## 2024-07-02 MED ORDER — ATROPINE SULFATE 1 MG/10ML IJ SOSY
PREFILLED_SYRINGE | INTRAMUSCULAR | Status: AC
  Filled 2024-07-02: qty 10

## 2024-07-02 NOTE — Progress Notes (Addendum)
 NEUROLOGY CONSULT FOLLOW UP NOTE   Date of service: July 02, 2024 Patient Name: Mike Sellers MRN:  [id number] DOB:  11/16/1874  Interval Hx/subjective  Continues to be intubated and sedated on fentanyl  and propofol  with no change to the rate of either since yesterday's exam.   Vitals   Vitals:   07/02/24 0545 07/02/24 0600 07/02/24 0615 07/02/24 0811  BP:    133/75  Pulse: 66 61 61   Resp: (!) 32 (!) 32 (!) 32   Temp: 98.6 F (37 C) 98.6 F (37 C) 98.4 F (36.9 C)   TempSrc:      SpO2: 100% 100% 100%   Weight:      Height:         Body mass index is 38.25 kg/m.  Physical Exam   Constitutional: Appears well-developed and well-nourished.  Eyes: No scleral injection.  Head: Normocephalic. No neck stiffness Respiratory: Intubated Skin: Cooling blanket under patient results in cool skin to extremities.   Neurologic Examination   Ment: GCS 3t in the context of intubation and sedation with propofol  at a rate of 40 and fentanyl  at a rate of 125 CN: Pupils 2 mm and unreactive, slightly irregular. No blink to threat. Eyes midline with no spontaneous movement or nystagmus. Absent oculocephalic reflex. Trace corneal reflex to right eye, absent corneal reflex to left eye. No cough or gag reflex (sedated) Motor/Sensory: Flaccid tone x 4. No posturing noted. No movement to noxious Reflexes: Low amplitude patellars. Toes mute bilaterally Cerebellar/Gait: Unable to assess  Medications  Current Facility-Administered Medications:    albuterol  (PROVENTIL ) (2.5 MG/3ML) 0.083% nebulizer solution 2.5 mg, 2.5 mg, Nebulization, Q4H PRN, Gleason, [name], PA-C   cefTRIAXone  (ROCEPHIN ) 2 g in sodium chloride  0.9 % 100 mL IVPB, 2 g, Intravenous, Q24H, Claudene Toribio BROCKS, MD, Stopped at 07/01/24 1048   Chlorhexidine  Gluconate Cloth 2 % PADS 6 each, 6 each, Topical, Daily, Ilah Krabbe M, PA-C, 6 each at 07/01/24 1605   dextrose  5 % in lactated ringers  infusion, , Intravenous, Continuous,  Kamat, Sunil G, MD, Last Rate: 75 mL/hr at 07/02/24 0600, Infusion Verify at 07/02/24 0600   docusate (COLACE) 50 MG/5ML liquid 100 mg, 100 mg, Per Tube, BID, Ilah, Stephanie M, PA-C, 100 mg at 07/01/24 2200   enoxaparin  (LOVENOX ) injection 50 mg, 50 mg, Subcutaneous, Q24H, Claudene Toribio BROCKS, MD, 50 mg at 07/02/24 0601   feeding supplement (PROSource TF20) liquid 60 mL, 60 mL, Per Tube, Daily, Claudene Toribio BROCKS, MD, 60 mL at 07/01/24 1014   feeding supplement (VITAL AF 1.2 CAL) liquid 1,000 mL, 1,000 mL, Per Tube, Continuous, Claudene Toribio BROCKS, MD, Last Rate: 45 mL/hr at 07/02/24 0600, Infusion Verify at 07/02/24 0600   fentaNYL  (SUBLIMAZE ) bolus via infusion 50-100 mcg, 50-100 mcg, Intravenous, Q15 min PRN, Harold Scholz, MD, 100 mcg at 07/01/24 0533   fentaNYL  in NS (92mcg/ml) infusion-PREMIX, 50-200 mcg/hr, Intravenous, Continuous, Claudene Toribio BROCKS, MD, Last Rate: 12.5 mL/hr at 07/02/24 0759, 125 mcg/hr at 07/02/24 0759   hydrocortisone  sodium succinate  (SOLU-CORTEF ) 100 MG injection 100 mg, 100 mg, Intravenous, Q12H, Claudene Toribio BROCKS, MD, 100 mg at 07/02/24 0009   insulin  aspart (novoLOG ) injection 0-9 Units, 0-9 Units, Subcutaneous, Q4H, Ilah Krabbe M, PA-C, 1 Units at 07/02/24 9662   levETIRAcetam  (KEPPRA ) undiluted injection 500 mg, 500 mg, Intravenous, BID, Khaliqdina, Salman, MD, 500 mg at 07/01/24 2200   midazolam  (VERSED ) injection 1-2 mg, 1-2 mg, Intravenous, Q1H PRN, Ilah Krabbe HERO, PA-C, 2  mg at 07/01/24 0235   midazolam  (VERSED ) injection 10 mg, 10 mg, Intravenous, Once PRN, Kassie Acquanetta Bradley, MD   multivitamin with minerals tablet 1 tablet, 1 tablet, Per Tube, Daily, Claudene Toribio BROCKS, MD, 1 tablet at 07/01/24 1014   norepinephrine  (LEVOPHED ) 4mg  in (0.016 mg/mL) premix infusion, 0-40 mcg/min, Intravenous, Continuous, Tegeler, Lonni PARAS, MD, Stopped at 06/30/24 2231   ondansetron  (ZOFRAN ) injection 4 mg, 4 mg, Intravenous, Q6H PRN, Ilah Corean HERO, PA-C    Oral care mouth rinse, 15 mL, Mouth Rinse, Q2H, Chand, Valinda, MD, 15 mL at 07/02/24 0601   Oral care mouth rinse, 15 mL, Mouth Rinse, PRN, Harold Valinda, MD   pantoprazole  (PROTONIX ) injection 40 mg, 40 mg, Intravenous, Q12H, Ilah Corean M, PA-C, 40 mg at 07/01/24 2200   polyethylene glycol (MIRALAX  / GLYCOLAX ) packet 17 g, 17 g, Per Tube, Daily, Ilah Corean M, PA-C, 17 g at 07/01/24 1014   propofol  (DIPRIVAN ) 1000 MG/100ML infusion, 40 mcg/kg/min, Intravenous, Continuous, Vanessa Robert, MD, Last Rate: 26.4 mL/hr at 07/02/24 0732, 40 mcg/kg/min at 07/02/24 0732   thiamine  (VITAMIN B1) tablet 100 mg, 100 mg, Per Tube, Daily, Chand, Valinda, MD, 100 mg at 07/01/24 1014  Labs and Diagnostic Imaging   CBC:  Recent Labs  Lab 07/01/24 0348 07/02/24 0330  WBC 12.4* 11.3*  HGB 14.6 13.5  HCT 44.3 41.2  MCV 87.0 87.1  PLT 158 166    Basic Metabolic Panel:  Lab Results  Component Value Date   NA 137 07/02/2024   K 3.8 07/02/2024   CO2 22 07/02/2024   GLUCOSE 137 (H) 07/02/2024   BUN 14 07/02/2024   CREATININE 0.91 07/02/2024   CALCIUM  8.0 (L) 07/02/2024   GFRNONAA 56 (L) 07/02/2024   Lipid Panel: No results found for: LDLCALC HgbA1c:  Lab Results  Component Value Date   HGBA1C 5.6 06/30/2024   Urine Drug Screen:     Component Value Date/Time   LABOPIA NONE DETECTED 06/29/2024 0836   COCAINSCRNUR NONE DETECTED 06/29/2024 0836   LABBENZ NONE DETECTED 06/29/2024 0836   AMPHETMU POSITIVE (A) 06/29/2024 0836   THCU NONE DETECTED 06/29/2024 0836   LABBARB NONE DETECTED 06/29/2024 0836    Alcohol  Level     Component Value Date/Time   ETH <15 06/29/2024 0836   INR  Lab Results  Component Value Date   INR 1.3 (H) 06/30/2024   APTT  Lab Results  Component Value Date   APTT 42 (H) 06/29/2024     Assessment  Mike Sellers is a male of unknown age (identity still hasn't been found by police) who presented on 8/14 after being found outside of a [employer] pulseless. He underwent 13 minutes of CPR. Neurology consulted for episodic seizure like activity. He was loaded with Keppra  followed by scheduled Keppra  at 500 mg BID. Started on long term EEG, which on most recent read shows moderate to severe diffuse encephalopathy, with no electrographic seizure or epileptiform discharges. He is currently sedated on propofol  and fentanyl . Per CCM, he previously had a strong cough and triggered the vent, but these reflexes are absent while on IV sedation. - Exam reveals no responses to external stimuli and absent brainstem reflexes, except for a trace right corneal, in the context of heavy IV sedation - UDS was positive for amphetamine.  - LTM EEG report for today (07/02/2024 0600 to  07/02/2024 0730): Continuous slow, generalized. This study is suggestive of moderate to severe diffuse encephalopathy. No seizures or epileptiform  discharges were seen throughout the recording.  - Impression: Probable anoxic brain injury following cardiac arrest  Recommendations  - Continue LTM EEG - Would attempt to wean off propofol  after MRI today.  - MRI brain today at 4 PM (72 hours after presentation) - Prognostic exam off all sedation on day 5. Will need to be normothermic and off all sedatives for at least 24 hours prior to prognostication - Continue Keppra  500 mg BID  - Avoid hypotension, hyponatremia, hyperthermia    I personally spent a total of 35 minutes in the care of this critically ill patient today including performing a medically appropriate exam/evaluation, independently interpreting results, and communicating results.  Addendum: - MRI preliminary review reveals florid/extensive DWI abnormality in the cerebral cortices and deep gray nuclei, consistent with severe diffuse anoxic brain injury.  - LTM EEG discontinued - CCM planning to wean propofol  - Neurohospitalist service will sign off. Please call if there are additional questions.   ______________________________________________________________________   Bonney SHARK, Winfield Caba, MD [facility]

## 2024-07-02 NOTE — Procedures (Signed)
 Patient Name: Mike Sellers  MRN: [id number]  Epilepsy Attending: Arlin MALVA Krebs  Referring Physician/Provider: Khaliqdina, Salman, MD  Duration: 07/02/2024 0600 to  07/02/2024 1745   Patient history: unknown age male s/p cardiac arrest. EEG to evaluate for seizure   Level of alertness: comatose   AEDs during EEG study: Propofol , LEV   Technical aspects: This EEG study was done with scalp electrodes positioned according to the 10-20 International system of electrode placement. Electrical activity was reviewed with band pass filter of 1-70Hz , sensitivity of 7 uV/mm, display speed of 48mm/sec with a 60Hz  notched filter applied as appropriate. EEG data were recorded continuously and digitally stored.  Video monitoring was available and reviewed as appropriate.   Description: EEG showed continuous generalized polymorphic 3 to 6 Hz theta-delta slowing. Hyperventilation and photic stimulation were not performed.      ABNORMALITY - Continuous slow, generalized   IMPRESSION: This study is suggestive of moderate to severe diffuse encephalopathy. No seizures or epileptiform discharges were seen throughout the recording.     Mike Sellers Mike Sellers [name]

## 2024-07-02 NOTE — Progress Notes (Signed)
   07/02/24 1553  Spiritual Encounters  Type of Visit Initial  Care provided to: Patient  Conversation partners present during encounter Nurse  Referral source Clinical staff  Reason for visit End-of-life  OnCall Visit Yes  Spiritual Framework  Patient Stress Factors None identified  Interventions  Spiritual Care Interventions Made Compassionate presence;Established relationship of care and support;Prayer   Chaplain visited Pt who was receiving comfort care at end of life. Chaplain consulted with the medical team prior to the visit. No  family was present at bedside. Pt was sedated and unable to respond.   Chaplain offered a word of prayer at bedside and remained available for continued support.

## 2024-07-02 NOTE — Plan of Care (Signed)
  Problem: Fluid Volume: Goal: Ability to maintain a balanced intake and output will improve Outcome: Progressing   Problem: Metabolic: Goal: Ability to maintain appropriate glucose levels will improve Outcome: Progressing   Problem: Nutritional: Goal: Maintenance of adequate nutrition will improve Outcome: Progressing Goal: Progress toward achieving an optimal weight will improve Outcome: Progressing   

## 2024-07-02 NOTE — Progress Notes (Signed)
 Pt was transported to MRI and back to 2h10 without complication.

## 2024-07-02 NOTE — Progress Notes (Signed)
 1218:  Pt's bedside EKG alarming with HR 40.  HR quickly decreasing to lowest of 35 bpm.  BP via arterial line 167/88 at time HR was 39.  Dr Rolan Sharps notified.   1224:  HR back up to 60 bpm.

## 2024-07-02 NOTE — Progress Notes (Signed)
 LTM EEG disconnected - no skin breakdown at [facility].

## 2024-07-02 NOTE — Progress Notes (Addendum)
 NAME:  Mike Sellers, MRN:  [id number], DOB:  11/16/1874, LOS: 3 ADMISSION DATE:  06/29/2024 CONSULTATION DATE:  06/29/2024 REFERRING MD:  Tegeler - EDP, CHIEF COMPLAINT:  Post-cardiac arrest, found down   History of Present Illness:  Unknown age man who presented to [facility] ED via EMS 8/14 after OOH cardiac arrest with unknown downtime. PMHx unknown.  Patient was reportedly found down outside of the [employer] with drug paraphernalia on site (crack pipe, lighters) with wet clothing/personal belongings. Per police, a Lexicographer reported seeing patient down around his arrival to work at 0400; at that time patient was breathing. On EMS arrival, patient was pulseless and apneic. Narcan was given without significant response. Initial rhythm was asystole and patient received CPR x 13 minutes, Epi x 2 with ROSC. Arrived to ED on Epi gtt. LMA placed. Intubated on arrival, no reported signs of responsiveness en route. On ED arrival, patient was afebrile, HR 80s, RR 25, BP 113/55 on Epi gtt, SpO2 100%. GCS 3.   Labs were notable for WBC 25.8, Hgb 14.7, Plt 231. INR 1.4. Na 145, K 3.9, CO2 16, BUN/Cr 11/2.57. Phos > 30. Mg 3.0. AST/ALT 872/1256, Alk Phos 99, Tbili 0.7. Trop 110. UA gluc 50, mod Hgb, prot 100. UDS +amphetamines. Initial CXR unremarkable. CT Head NAICA, CT C-Spine negative for acute traumatic injury.  PCCM consulted for ICU admission.  Pertinent Medical History:  History reviewed. No pertinent past medical history.  Significant Hospital Events: Including procedures, antibiotic start and stop dates in addition to other pertinent events   8/14 - Presented to [facility] ED as OOH cardiac arrest, Mike Sellers with unknown downtime. Initial rhythm asystole, CPR x 13 min, Epi x 2, Epi gtt. Intubated on ED arrival and NE started. CXR unremarkable. Labs/imaging pending. 8/15 neuro exam poor, myclonus overnight, on 1mcg norepi  Interim History / Subjective:  On LTVEEG, heavy sedation, vent  Objective:   Blood pressure 133/75, pulse 61, temperature 98.4 F (36.9 C), resp. rate (!) 32, height 5' 8 (1.727 m), weight 114.1 kg, SpO2 100%.    Vent Mode: PRVC FiO2 (%):  [40 %] 40 % Set Rate:  [32 bmp] 32 bmp Vt Set:  [500 mL] 500 mL PEEP:  [8 cmH20] 8 cmH20 Plateau Pressure:  [20 cmH20-24 cmH20] 23 cmH20   Intake/Output Summary (Last 24 hours) at 07/02/2024 1023 Last data filed at 07/02/2024 0600 Gross per 24 hour  Intake 3570.95 ml  Output 1275 ml  Net 2295.95 ml   Filed Weights   06/30/24 0500 07/01/24 0457 07/02/24 0500  Weight: 108.6 kg 108.7 kg 114.1 kg   Unresponsive Sluggish pupils, I was unable to get corneal or doll's on current sedation, neuro may have gotten slight corneal on R Lungs with improving rhonci Minimal edema Abd soft  Assessment & Plan:  OHCA- everything points toward sequelae of overdose Post arrest respiratory failure with aspiration pneumonitis Post arrest acute kidney and liver injury- multifactorial ATN from arrest and rhabdo; improving with hydration Methamphetamine +, found with a pipe as well Unknown medical history Post arrest encephalopathy- with concern for an; anoxic brain injury, on TTM; some possible seizure activity now on LTVEEG and keppra  Type II NSTEMI RV dysfunction- seems more related to hypoxemia, LE duplex neg; not the degree one would see with massive PE; could consider CTA down the line if RV dysfunction continues Hypoglycemia- cortisol low for degree of illness, empiric hydrocortisone  ordered along with dextrose , TF Hypoactive BS, mild abd distension- checking KUB  -  Continue hydration with dextrose  - Continue empiric stress steroids until sugars settle out - Abx to ceftriaxone  x 4 additional days - MRI today then lighten sedation, discussed with neuro; multimodal prognostication - Still trying to find identity, has been fingerprinted - Check KUB - TF to goal - May strengthen bowel regimen - On DVT dose ppx lovenox   38 min cc  time Rolan Sharps MD PCCM

## 2024-07-02 NOTE — Progress Notes (Signed)
 [facility] ADULT ICU REPLACEMENT PROTOCOL   The patient does apply for the [facility] Adult ICU Electrolyte Replacment Protocol based on the criteria listed below:   1.Exclusion criteria: TCTS, ECMO, Dialysis, and Myasthenia Gravis patients 2. Is GFR >/= 30 ml/min? Yes.    Patient's GFR today is 56 3. Is SCr </= 2? Yes.   Patient's SCr is 0.91 mg/dL 4. Did SCr increase >/= 0.5 in 24 hours? No. 5.Pt's weight >40kg  Yes.   6. Abnormal electrolyte(s): Mag 1.7  7. Electrolytes replaced per protocol 8.  Call MD STAT for K+ </= 2.5, Phos </= 1, or Mag </= 1 Physician:  Dr. Fate Rummer, Recardo ORN 07/02/2024 4:40 AM

## 2024-07-03 ENCOUNTER — Other Ambulatory Visit: Payer: Self-pay

## 2024-07-03 ENCOUNTER — Inpatient Hospital Stay (HOSPITAL_COMMUNITY): Payer: No Typology Code available for payment source

## 2024-07-03 DIAGNOSIS — J9601 Acute respiratory failure with hypoxia: Secondary | ICD-10-CM | POA: Diagnosis not present

## 2024-07-03 DIAGNOSIS — I469 Cardiac arrest, cause unspecified: Secondary | ICD-10-CM | POA: Diagnosis not present

## 2024-07-03 DIAGNOSIS — I6782 Cerebral ischemia: Secondary | ICD-10-CM

## 2024-07-03 LAB — POCT I-STAT 7, (LYTES, BLD GAS, ICA,H+H)
Acid-Base Excess: 0 mmol/L (ref 0.0–2.0)
Bicarbonate: 24.6 mmol/L (ref 20.0–28.0)
Calcium, Ion: 1.21 mmol/L (ref 1.15–1.40)
HCT: 35 % — ABNORMAL LOW (ref 39.0–52.0)
Hemoglobin: 11.9 g/dL — ABNORMAL LOW (ref 13.0–17.0)
O2 Saturation: 99 %
Patient temperature: 37
Potassium: 3.6 mmol/L (ref 3.5–5.1)
Sodium: 140 mmol/L (ref 135–145)
TCO2: 26 mmol/L (ref 22–32)
pCO2 arterial: 39.3 mmHg (ref 32–48)
pH, Arterial: 7.404 (ref 7.35–7.45)
pO2, Arterial: 152 mmHg — ABNORMAL HIGH (ref 83–108)

## 2024-07-03 LAB — BASIC METABOLIC PANEL WITH GFR
Anion gap: 11 (ref 5–15)
Anion gap: 6 (ref 5–15)
Anion gap: 7 (ref 5–15)
Anion gap: 6 (ref 5–15)
Anion gap: 7 (ref 5–15)
BUN: 17 mg/dL (ref 8–23)
BUN: 17 mg/dL (ref 8–23)
BUN: 14 mg/dL (ref 8–23)
BUN: 14 mg/dL (ref 6–20)
BUN: 15 mg/dL (ref 6–20)
CO2: 21 mmol/L — ABNORMAL LOW (ref 22–32)
CO2: 26 mmol/L (ref 22–32)
CO2: 22 mmol/L (ref 22–32)
CO2: 23 mmol/L (ref 22–32)
CO2: 24 mmol/L (ref 22–32)
Calcium: 7 mg/dL — ABNORMAL LOW (ref 8.9–10.3)
Calcium: 8 mg/dL — ABNORMAL LOW (ref 8.9–10.3)
Calcium: 8 mg/dL — ABNORMAL LOW (ref 8.9–10.3)
Calcium: 8 mg/dL — ABNORMAL LOW (ref 8.9–10.3)
Calcium: 7.8 mg/dL — ABNORMAL LOW (ref 8.9–10.3)
Chloride: 107 mmol/L (ref 98–111)
Chloride: 109 mmol/L (ref 98–111)
Chloride: 108 mmol/L (ref 98–111)
Chloride: 107 mmol/L (ref 98–111)
Chloride: 108 mmol/L (ref 98–111)
Creatinine, Ser: 1.87 mg/dL — ABNORMAL HIGH (ref 0.61–1.24)
Creatinine, Ser: 1.04 mg/dL (ref 0.61–1.24)
Creatinine, Ser: 0.91 mg/dL (ref 0.61–1.24)
Creatinine, Ser: 0.7 mg/dL (ref 0.61–1.24)
Creatinine, Ser: 0.76 mg/dL (ref 0.61–1.24)
GFR, Estimated: 24 mL/min — ABNORMAL LOW (ref >60)
GFR, Estimated: 48 mL/min — ABNORMAL LOW (ref >60)
GFR, Estimated: 56 mL/min — ABNORMAL LOW (ref >60)
GFR, Estimated: >60 mL/min (ref >60)
GFR, Estimated: >60 mL/min (ref >60)
Glucose, Bld: 145 mg/dL — ABNORMAL HIGH (ref 70–99)
Glucose, Bld: 87 mg/dL (ref 70–99)
Glucose, Bld: 137 mg/dL — ABNORMAL HIGH (ref 70–99)
Glucose, Bld: 117 mg/dL — ABNORMAL HIGH (ref 70–99)
Glucose, Bld: 104 mg/dL — ABNORMAL HIGH (ref 70–99)
Potassium: 6.3 mmol/L (ref 3.5–5.1)
Potassium: 4.2 mmol/L (ref 3.5–5.1)
Potassium: 3.8 mmol/L (ref 3.5–5.1)
Potassium: 3.3 mmol/L — ABNORMAL LOW (ref 3.5–5.1)
Potassium: 3.4 mmol/L — ABNORMAL LOW (ref 3.5–5.1)
Sodium: 139 mmol/L (ref 135–145)
Sodium: 141 mmol/L (ref 135–145)
Sodium: 137 mmol/L (ref 135–145)
Sodium: 136 mmol/L (ref 135–145)
Sodium: 139 mmol/L (ref 135–145)

## 2024-07-03 LAB — COMPREHENSIVE METABOLIC PANEL WITH GFR
ALT: 1256 U/L — ABNORMAL HIGH (ref 0–44)
ALT: 1408 U/L — ABNORMAL HIGH (ref 0–44)
AST: 872 U/L — ABNORMAL HIGH (ref 15–41)
AST: 840 U/L — ABNORMAL HIGH (ref 15–41)
Albumin: 3 g/dL — ABNORMAL LOW (ref 3.5–5.0)
Albumin: 2.7 g/dL — ABNORMAL LOW (ref 3.5–5.0)
Alkaline Phosphatase: 99 U/L (ref 38–126)
Alkaline Phosphatase: 64 U/L (ref 38–126)
Anion gap: 21 — ABNORMAL HIGH (ref 5–15)
Anion gap: 12 (ref 5–15)
BUN: 11 mg/dL (ref 8–23)
BUN: 20 mg/dL (ref 8–23)
CO2: 16 mmol/L — ABNORMAL LOW (ref 22–32)
CO2: 23 mmol/L (ref 22–32)
Calcium: 8.2 mg/dL — ABNORMAL LOW (ref 8.9–10.3)
Calcium: 8.2 mg/dL — ABNORMAL LOW (ref 8.9–10.3)
Chloride: 108 mmol/L (ref 98–111)
Chloride: 107 mmol/L (ref 98–111)
Creatinine, Ser: 2.57 mg/dL — ABNORMAL HIGH (ref 0.61–1.24)
Creatinine, Ser: 1.6 mg/dL — ABNORMAL HIGH (ref 0.61–1.24)
GFR, Estimated: 16 mL/min — ABNORMAL LOW (ref >60)
GFR, Estimated: 28 mL/min — ABNORMAL LOW (ref >60)
Glucose, Bld: 123 mg/dL — ABNORMAL HIGH (ref 70–99)
Glucose, Bld: 136 mg/dL — ABNORMAL HIGH (ref 70–99)
Potassium: 3.9 mmol/L (ref 3.5–5.1)
Potassium: 3.7 mmol/L (ref 3.5–5.1)
Sodium: 145 mmol/L (ref 135–145)
Sodium: 142 mmol/L (ref 135–145)
Total Bilirubin: 0.7 mg/dL (ref 0.0–1.2)
Total Bilirubin: 0.8 mg/dL (ref 0.0–1.2)
Total Protein: 5.2 g/dL — ABNORMAL LOW (ref 6.5–8.1)
Total Protein: 5.1 g/dL — ABNORMAL LOW (ref 6.5–8.1)

## 2024-07-03 LAB — CBC
HCT: 41.2 % (ref 39.0–52.0)
Hemoglobin: 13.6 g/dL (ref 13.0–17.0)
MCH: 28.3 pg (ref 26.0–34.0)
MCHC: 33 g/dL (ref 30.0–36.0)
MCV: 85.7 fL (ref 80.0–100.0)
Platelets: 222 K/uL (ref 150–400)
RBC: 4.81 MIL/uL (ref 4.22–5.81)
RDW: 13 % (ref 11.5–15.5)
WBC: 11.1 K/uL — ABNORMAL HIGH (ref 4.0–10.5)
nRBC: 0 % (ref 0.0–0.2)

## 2024-07-03 LAB — RENAL FUNCTION PANEL
Albumin: 2.6 g/dL — ABNORMAL LOW (ref 3.5–5.0)
Anion gap: 9 (ref 5–15)
BUN: 18 mg/dL (ref 8–23)
CO2: 26 mmol/L (ref 22–32)
Calcium: 8 mg/dL — ABNORMAL LOW (ref 8.9–10.3)
Chloride: 108 mmol/L (ref 98–111)
Creatinine, Ser: 1.38 mg/dL — ABNORMAL HIGH (ref 0.61–1.24)
GFR, Estimated: 34 mL/min — ABNORMAL LOW (ref >60)
Glucose, Bld: 119 mg/dL — ABNORMAL HIGH (ref 70–99)
Phosphorus: 2.9 mg/dL (ref 2.5–4.6)
Potassium: 3.1 mmol/L — ABNORMAL LOW (ref 3.5–5.1)
Sodium: 143 mmol/L (ref 135–145)

## 2024-07-03 LAB — POCT I-STAT, CHEM 8
BUN: 14 mg/dL (ref 6–20)
Calcium, Ion: 1.01 mmol/L — ABNORMAL LOW (ref 1.15–1.40)
Chloride: 109 mmol/L (ref 98–111)
Creatinine, Ser: 2.3 mg/dL — ABNORMAL HIGH (ref 0.61–1.24)
Glucose, Bld: 121 mg/dL — ABNORMAL HIGH (ref 70–99)
HCT: 43 % (ref 39.0–52.0)
Hemoglobin: 14.6 g/dL (ref 13.0–17.0)
Potassium: 3.7 mmol/L (ref 3.5–5.1)
Sodium: 144 mmol/L (ref 135–145)
TCO2: 16 mmol/L — ABNORMAL LOW (ref 22–32)

## 2024-07-03 LAB — TYPE AND SCREEN
ABO/RH(D): O POS
Antibody Screen: NEGATIVE

## 2024-07-03 LAB — PHOSPHORUS: Phosphorus: 3 mg/dL (ref 2.5–4.6)

## 2024-07-03 LAB — GLUCOSE, CAPILLARY
Glucose-Capillary: 114 mg/dL — ABNORMAL HIGH (ref 70–99)
Glucose-Capillary: 97 mg/dL (ref 70–99)
Glucose-Capillary: 125 mg/dL — ABNORMAL HIGH (ref 70–99)
Glucose-Capillary: 120 mg/dL — ABNORMAL HIGH (ref 70–99)
Glucose-Capillary: 81 mg/dL (ref 70–99)
Glucose-Capillary: 98 mg/dL (ref 70–99)
Glucose-Capillary: 105 mg/dL — ABNORMAL HIGH (ref 70–99)
Glucose-Capillary: 106 mg/dL — ABNORMAL HIGH (ref 70–99)

## 2024-07-03 LAB — TRIGLYCERIDES: Triglycerides: 158 mg/dL — ABNORMAL HIGH (ref <150)

## 2024-07-03 LAB — MAGNESIUM
Magnesium: 1.5 mg/dL — ABNORMAL LOW (ref 1.7–2.4)
Magnesium: 1.8 mg/dL (ref 1.7–2.4)

## 2024-07-03 MED ORDER — MAGNESIUM SULFATE 4 GM/100ML IV SOLN
4.0000 g | Freq: Once | INTRAVENOUS | Status: AC
  Administered 2024-07-03: 4 g via INTRAVENOUS
  Filled 2024-07-03: qty 100

## 2024-07-03 MED ORDER — POTASSIUM CHLORIDE 10 MEQ/50ML IV SOLN
10.0000 meq | INTRAVENOUS | Status: AC
  Administered 2024-07-03 (×5): 10 meq via INTRAVENOUS
  Filled 2024-07-03 (×5): qty 50

## 2024-07-03 MED ORDER — FUROSEMIDE 10 MG/ML IJ SOLN
40.0000 mg | Freq: Once | INTRAMUSCULAR | Status: AC
  Administered 2024-07-03: 40 mg via INTRAVENOUS
  Filled 2024-07-03: qty 4

## 2024-07-03 MED ORDER — BUSPIRONE HCL 15 MG PO TABS
7.5000 mg | ORAL_TABLET | Freq: Two times a day (BID) | ORAL | Status: DC | PRN
  Administered 2024-07-03: 7.5 mg
  Filled 2024-07-03 (×2): qty 1

## 2024-07-03 MED ORDER — POTASSIUM CHLORIDE 20 MEQ PO PACK: 40.0000 meq | PACK | ORAL | Status: DC

## 2024-07-03 MED ORDER — POTASSIUM CHLORIDE 10 MEQ/50ML IV SOLN
INTRAVENOUS | Status: AC
  Administered 2024-07-03: 10 meq via INTRAVENOUS
  Filled 2024-07-03: qty 50

## 2024-07-03 NOTE — Plan of Care (Signed)
  Problem: Education: Goal: Ability to describe self-care measures that may prevent or decrease complications (Diabetes Survival Skills Education) will improve Outcome: Progressing Goal: Individualized Educational Video(s) Outcome: Progressing   Problem: Coping: Goal: Ability to adjust to condition or change in health will improve Outcome: Progressing   Problem: Fluid Volume: Goal: Ability to maintain a balanced intake and output will improve Outcome: Progressing   Problem: Health Behavior/Discharge Planning: Goal: Ability to identify and utilize available resources and services will improve Outcome: Progressing Goal: Ability to manage health-related needs will improve Outcome: Progressing   Problem: Nutritional: Goal: Maintenance of adequate nutrition will improve Outcome: Progressing Goal: Progress toward achieving an optimal weight will improve Outcome: Progressing   Problem: Skin Integrity: Goal: Risk for impaired skin integrity will decrease Outcome: Progressing   Problem: Tissue Perfusion: Goal: Adequacy of tissue perfusion will improve Outcome: Progressing

## 2024-07-03 NOTE — Progress Notes (Signed)
 Police were able to ID based on fingerprints-- Mike Sellers. Previous charts listed mother Delon Duos (now [place]) as an emergency contact. Two messages were left to try to get in touch with her. She has called back and knows her son has been in the hospital for a few days. I asked her to please come to the hospital to further discuss his care.  Leita SHAUNNA Gaskins, DO 07/03/24 2:20 PM [facility]  For contact information, see Amion. If no response to pager, please call PCCM consult pager. After hours, 7PM- 7AM, please call Elink.

## 2024-07-03 NOTE — IPAL (Signed)
  Interdisciplinary Goals of Care Family Meeting   Date carried out:: 07/03/2024  Location of the meeting: Bedside  Member's involved: Bedside Registered Nurse, Chaplain, Family Member or next of kin, and Other: physician assistant   Durable Power of Attorney or acting medical decision maker:   Pt's mother Delon   Discussion: We discussed goals of care for [name] .  Pt's mother Delon is just finding out about her son's hospitalization today.  She shares that he has been homeless for several years and this is not his first overdose.  We discussed his poor neurologic status post-code and the results of his brain MRI.  His mother and step-father are understandably overwhelmed, we discussed the option of trach/PEG vs comfort focused care.   They feel certain that Daisuke would not want to live on life support long term, but right now they would like to continue full code and all medical interventions.  Code status: Full Code  Disposition: Continue current acute care   Time spent for the meeting:  30 minutes  [name] Javarious Elsayed 07/03/2024, 4:57 PM

## 2024-07-03 NOTE — Progress Notes (Signed)
 Reached out to the [facility] non-emergency number and spoke to Lt. Ozell Diamond regarding Mike Sellers. He was able to look up information and identify the patient as Mike Sellers DOB Mar 26, 1991 based off fingerprints that had been obtained at the time of admission. Physician notified of information. Prior electronic chart discovered matching patient demographics. Emergency contact number obtained from previous chart for mother.

## 2024-07-03 NOTE — Progress Notes (Signed)
 RT NOTE: attempted SBT on CPAP/PSV of 12/8 at 0800.  Patient became apneic and backup ventilation alarmed.  Placed patient back on full support ventilation.  Tolerating well at this time.  Will continue to monitor.

## 2024-07-03 NOTE — Progress Notes (Signed)
 Nutrition Follow-up  DOCUMENTATION CODES:   Not applicable  INTERVENTION:   As clinical status allows, resume trickle tube feeds via OG tube with Vital AF 1.2 at 20 ml/h. When able, increase by 10 ml every 8 hours to goal rate of 65 ml/h.   Continue thiamine  x 5 doses.   NUTRITION DIAGNOSIS:   Inadequate oral intake related to acute illness as evidenced by NPO status; ongoing   GOAL:   Patient will meet greater than or equal to 90% of their needs; unmet, TF currently on hold  MONITOR:   Vent status, Labs, Weight trends, TF tolerance, Skin  REASON FOR ASSESSMENT:   Ventilator    ASSESSMENT:   Male patient with unknown identity admitted post OOH cardiac arrest with unknown downtime, undifferentiated shock (cardiogenic vs septic), +AKI, shock liver, possible aspiration pneumonia.  Pt ws found down outside of [employer] with drug paraphernalia on site. Initial rhythm asystole, CPR x 13 minutes, Epi x 2 with Epi gtt, Intubated, levo initiated. PMH currently unknown. UDS+ amphetamines  Spoke with RN who reports tube feeding is currently on hold d/t emesis. Currently trialing medications via tube. Reglan  was started this morning. Abd film this morning c/w ileus. Phosphorus, magnesium , and potassium have been running low likely r/t refeeding syndrome. Receiving repletion per MD.   TF was initiated on 8/15, slowly titrated up and reached goal rate on 8/17. Current TF orders: Vital AF 1.2 at 65 ml/h with Prosource TF20 60 ml daily via OG tube.   Patient remains intubated on ventilator support. MV: 9.2 L/min Temp (24hrs), Avg:99.4 F (37.4 C), Min:96.8 F (36 C), Max:100.4 F (38 C) MAP (a-line): [id number]  Labs reviewed. K 3.3, mag 1.5 CBG: 125-120-81  Medications reviewed and include colace, reglan , MVI, protonix , miralax , thiamine , fentanyl , levophed . Propofol  currently off. Received KCl (per tube and IV) and mag sulfate (IV) for repletion this morning.  Received phos-nak yesterday. IVF: D5 LR at 75 ml/h  I/O +6.8 L since admit UOP 1,600 ml x 24 h Emesis 100 ml this AM  Diet Order:   Diet Order             Diet NPO time specified  Diet effective now                   EDUCATION NEEDS:   Not appropriate for education at this time  Skin:  Skin Assessment: Reviewed RN Assessment  Last BM:  8/17 type 6  Height:   Ht Readings from Last 1 Encounters:  06/29/24 5' 8 (1.727 m)   Weight:   Wt Readings from Last 1 Encounters:  07/03/24 116.3 kg   BMI:  Body mass index is 38.98 kg/m.  Estimated Nutritional Needs:   Kcal:  1750-1950 kcals  Protein:  85-105 g  Fluid:  >/= 1.8 L   Suzen HUNT RD, LDN, CNSC Contact via secure chat. If unavailable, use group chat RD Inpatient.

## 2024-07-03 NOTE — Progress Notes (Signed)
 NAME:  Rebecca Nan Pert, MRN:  [id number], DOB:  11/16/1874, LOS: 4 ADMISSION DATE:  06/29/2024 CONSULTATION DATE:  06/29/2024 REFERRING MD:  Tegeler - EDP, CHIEF COMPLAINT:  Post-cardiac arrest, found down   History of Present Illness:  Unknown age man who presented to [facility] ED via EMS 8/14 after OOH cardiac arrest with unknown downtime. PMHx unknown.  Patient was reportedly found down outside of the [employer] with drug paraphernalia on site (crack pipe, lighters) with wet clothing/personal belongings. Per police, a Lexicographer reported seeing patient down around his arrival to work at 0400; at that time patient was breathing. On EMS arrival, patient was pulseless and apneic. Narcan was given without significant response. Initial rhythm was asystole and patient received CPR x 13 minutes, Epi x 2 with ROSC. Arrived to ED on Epi gtt. LMA placed. Intubated on arrival, no reported signs of responsiveness en route. On ED arrival, patient was afebrile, HR 80s, RR 25, BP 113/55 on Epi gtt, SpO2 100%. GCS 3.   Labs were notable for WBC 25.8, Hgb 14.7, Plt 231. INR 1.4. Na 145, K 3.9, CO2 16, BUN/Cr 11/2.57. Phos > 30. Mg 3.0. AST/ALT 872/1256, Alk Phos 99, Tbili 0.7. Trop 110. UA gluc 50, mod Hgb, prot 100. UDS +amphetamines. Initial CXR unremarkable. CT Head NAICA, CT C-Spine negative for acute traumatic injury.  PCCM consulted for ICU admission.  Pertinent Medical History:  History reviewed. No pertinent past medical history.  Significant Hospital Events: Including procedures, antibiotic start and stop dates in addition to other pertinent events   8/14 - Presented to [facility] ED as OOH cardiac arrest, [name] with unknown downtime. Initial rhythm asystole, CPR x 13 min, Epi x 2, Epi gtt. Intubated on ED arrival and NE started. CXR unremarkable. Labs/imaging pending. 8/15 neuro exam poor, myclonus overnight, on 1mcg norepi 8/18 No improvement in neuro exam, police able to ID and mom coming  in  Interim History / Subjective:  Not following commands with sedation wean MRI consistent with anoxic injury  Objective:  Blood pressure 119/69, pulse 77, temperature 100.2 F (37.9 C), resp. rate (!) 32, height 5' 8 (1.727 m), weight 116.3 kg, SpO2 99%.    Vent Mode: PRVC FiO2 (%):  [40 %] 40 % Set Rate:  [32 bmp] 32 bmp Vt Set:  [500 mL] 500 mL PEEP:  [8 cmH20] 8 cmH20 Plateau Pressure:  [19 cmH20-23 cmH20] 19 cmH20   Intake/Output Summary (Last 24 hours) at 07/03/2024 0735 Last data filed at 07/03/2024 9385 Gross per 24 hour  Intake 2829.94 ml  Output 1085 ml  Net 1744.94 ml   Filed Weights   07/01/24 0457 07/02/24 0500 07/03/24 0500  Weight: 108.7 kg 114.1 kg 116.3 kg   General:  well nourished M, suspect in the 30's intubated and sedated HEENT: MM pink/moist, sclera anicteric Neuro: pupils equal and responsive, examined off propofol  and on minimal fentanyl , + gag and triggering vent,not following commands and does not withdraw to pain + corneals  CV: s1s2 rrr, no m/r/g PULM:  clear bilaterally and synchronous with vent  GI: soft, non-distended  Extremities: warm/dry, upper extremity edema    Labs: Creatinine 0.7 K 3.3 Mag 1.5   Resolved Hospital Problem List:  AKI  Assessment & Plan:    Post-cardiac arrest, out of hospital with unknown downtime Undifferentiated shock, cardiogenic versus septic component Hypoglycemia Ischemic brain injury Suspect hypoxic arrest in the setting of drug use, ?aspiration. - continue  Targeted temperature management/normothermia protocol - Goal  MAP > 65, intermittently on pressors  - Fluid resuscitation as tolerated  - Trend troponins - Echo with EF 55-60%, RV systolic function mild to moderately reduced with mild RV enlargement, small pericardial effusion  -continue Unasyn  and following culture data -TF held overnight for vomiting and back on D% -KUB yesterday ok, restart trickle feds  -overall prognosis is poor, able to  reach mom today who is coming into the hospital   Acute hypoxemic respiratory failure in the setting of cardiac arrest Concern for aspiration PNA - Continue full vent support (4-8cc/kg IBW) - Wean FiO2 for O2 sat > 90% - Daily WUA/SBT once appropriate, not clinically appropriate at this time due to poor mental status and concern for sx - VAP bundle - Pulmonary hygiene - PAD protocol for sedation: Propofol  and Fentanyl  for goal RASS 0 to -1; off sedation at present and remains unresponsive - Follow CXR, ABG - Unasyn  for aspiration coverage   Hypomagnesemia Hypophosphatemia  - Trend BMP - Replete electrolytes as indicated - Monitor I&Os - Avoid nephrotoxic agents as able - Ensure adequate renal perfusion  Shock liver - LFT's improving   Substance abuse Received Narcan with EMS with no response. - UDS +amphetamines - Police involved for identification purposes, name is Darryl Willner -mom coming in for GOC   Best Practice: (right click and Reselect all SmartList Selections daily)   Diet/type: tubefeeds DVT prophylaxis: SCDs, SQH GI prophylaxis: PPI Lines: Central line and Arterial Line Foley:  Yes, and it is still needed Code Status:  full code Last date of multidisciplinary goals of care discussion [Pending]  Labs:  CBC: Recent Labs  Lab 06/29/24 0825 06/29/24 0854 06/30/24 0352 06/30/24 1459 07/01/24 0348 07/02/24 0330 07/03/24 0146  WBC 25.8*  --  16.1*  --  12.4* 11.3* 11.1*  HGB 14.7   < > 15.7 13.3 14.6 13.5 13.6  HCT 49.2   < > 46.1 39.0 44.3 41.2 41.2  MCV 96.1  --  84.3  --  87.0 87.1 85.7  PLT 231  --  204  --  158 166 222   < > = values in this interval not displayed.   Basic Metabolic Panel: Recent Labs  Lab 06/30/24 0352 06/30/24 1208 06/30/24 1459 06/30/24 2234 07/01/24 0348 07/02/24 0330 07/03/24 0146  NA 142 143 144  --  141 137 136  K 3.7 3.1* 3.2* 3.1* 4.2 3.8 3.3*  CL 107 108  --   --  109 108 107  CO2 23 26  --   --  26 22  23   GLUCOSE 136* 119*  --   --  87 137* 117*  BUN 20 18  --   --  17 14 14   CREATININE 1.60* 1.38*  --   --  1.04 0.91 0.70  CALCIUM  8.2* 8.0*  --   --  8.0* 8.0* 8.0*  MG 1.5* 2.3  --   --  1.9 1.7 1.5*  PHOS 1.4* 2.9  --   --  3.2 2.4* 3.0   GFR: Estimated Creatinine Clearance: -12.2 mL/min (by C-G formula based on SCr of 0.7 mg/dL). Recent Labs  Lab 06/29/24 0820 06/29/24 0825 06/29/24 1341 06/29/24 1558 06/30/24 0352 07/01/24 0348 07/02/24 0330 07/03/24 0146  WBC  --    < >  --   --  16.1* 12.4* 11.3* 11.1*  LATICACIDVEN 13.4*  --  3.7* 2.9*  --   --   --   --    < > = values  in this interval not displayed.   Liver Function Tests: Recent Labs  Lab 06/29/24 0825 06/30/24 0352 06/30/24 1208 07/01/24 0348 07/02/24 0330  AST 872* 840*  --  458* 270*  ALT 1,256* 1,408*  --  1,069* 691*  ALKPHOS 99 64  --  61 66  BILITOT 0.7 0.8  --  0.7 0.7  PROT 5.2* 5.1*  --  5.2* 5.3*  ALBUMIN  3.0* 2.7* 2.6* 2.6* 2.4*   No results for input(s): LIPASE, AMYLASE in the last 168 hours. Recent Labs  Lab 07/01/24 0348  AMMONIA 36*    ABG:    Component Value Date/Time   PHART 7.481 (H) 06/30/2024 1459   PCO2ART 34.6 06/30/2024 1459   PO2ART 134 (H) 06/30/2024 1459   HCO3 25.8 06/30/2024 1459   TCO2 27 06/30/2024 1459   ACIDBASEDEF 1.0 06/30/2024 0043   O2SAT 99 06/30/2024 1459    Coagulation Profile: Recent Labs  Lab 06/29/24 0825 06/30/24 0352  INR 1.4* 1.3*   Cardiac Enzymes: Recent Labs  Lab 06/30/24 1208  CKTOTAL 4,495*    HbA1C: Hgb A1c MFr Bld  Date/Time Value Ref Range Status  06/30/2024 03:52 AM 5.6 4.8 - 5.6 % Final    Comment:    (NOTE) Diagnosis of Diabetes The following HbA1c ranges recommended by the American Diabetes Association (ADA) may be used as an aid in the diagnosis of diabetes mellitus.  Hemoglobin             Suggested A1C NGSP%              Diagnosis  <5.7                   Non Diabetic  5.7-6.4                 Pre-Diabetic  >6.4                   Diabetic  <7.0                   Glycemic control for                       adults with diabetes.      CBG: Recent Labs  Lab 07/02/24 1953 07/02/24 2325 07/03/24 0143 07/03/24 0322 07/03/24 0450  GLUCAP 92 77 114* 97 125*   Review of Systems:   Patient is encephalopathic and/or intubated; therefore, history has been obtained from chart review.   Past Medical History:  He,  has no past medical history on file.   Surgical History:  History reviewed. No pertinent surgical history.   Social History:   reports current drug use.   Family History:  His family history is not on file.   Allergies: Not on File   Home Medications: Prior to Admission medications   Not on File    Critical care time: 35 mins    CRITICAL CARE Performed by: [name] Maridee Slape   Total critical care time: 35 minutes  Critical care time was exclusive of separately billable procedures and treating other patients.  Critical care was necessary to treat or prevent imminent or life-threatening deterioration.  Critical care was time spent personally by me on the following activities: development of treatment plan with patient and/or surrogate as well as nursing, discussions with consultants, evaluation of patient's response to treatment, examination of patient, obtaining history from patient or surrogate, ordering and performing treatments and interventions, ordering and review  of laboratory studies, ordering and review of radiographic studies, pulse oximetry and re-evaluation of patient's condition.   [name] Mccartney Brucks, PA-C [facility] See Amion for pager If no response to pager , please call [phone] until 7pm After 7:00 pm call Elink  [phone]

## 2024-07-03 NOTE — Progress Notes (Signed)
 eLink Physician-Brief Progress Note Patient Name: Mike Sellers DOB: 12/11/1990 MRN: [id number]   Date of Service  07/03/2024  HPI/Events of Note    eICU Interventions  Placed orders per ogran donation request:  UA, urine culture, blood culture, COVID PCR swab, MRSA swab, type and screen, CBC, CMP with LFTs, coags, fibrinogen , direct bilirubin, lactate, ABG.         [name] M DELA CRUZ 07/03/2024, 11:40 PM

## 2024-07-03 NOTE — Progress Notes (Signed)
   07/03/24 1500  Spiritual Encounters  Type of Visit Follow up  Care provided to: Pt and family  Conversation partners present during encounter Nurse;Physician  Referral source Nurse (RN/NT/LPN)  OnCall Visit Yes    Chaplain made follow up visit. The patient was unresponsive. His parents were present. His mother shared that he was a good, smart kid before using drugs and they had a strong relationship. Also, he began not to believe in God after his grandmother passed away. She was tearful. His step dad said that he has been with him since he was 33 years old. He was a good child.     Chaplain was present, asked open ended questions, provided emotional support, offered a prayer and brought some water. Chaplain services remains available if needed.    M.Kubra Susanna Kerry Resident [phone]

## 2024-07-03 NOTE — IPAL (Signed)
  Interdisciplinary Goals of Care Family Meeting   Date carried out: 07/03/2024  Location of the meeting: Bedside  Member's involved: Physician, Bedside Registered Nurse, and Family Member or next of kin  Durable Power of Attorney or acting medical decision maker: mother Mike Sellers    Discussion: We discussed goals of care for [name] .  I met with his mother, step-father, and an aunt. They all agreed that he would not want to be dependent on others and requiring total care in a nursing home. His mother is upset and struggling with talking about next steps, but his aunt and step-father feel that withdrawal of care would be most appropriate. I explained that his mother likely needs time, and it is her decision as his legal NOK. We were not able to discuss code status change yet.  Code status:   Code Status: Full Code   Disposition: Continue current acute care  Time spent for the meeting: 20 min.    Mike SHAUNNA Gaskins, DO  07/03/2024, 5:34 PM

## 2024-07-03 NOTE — Progress Notes (Signed)
Transported patient to C.T while patient was on the mechanical ventilator. Patient remained stable during transport.  

## 2024-07-03 NOTE — Progress Notes (Signed)
   07/03/24 1400  Spiritual Encounters  Type of Visit Initial  Conversation partners present during encounter Nurse  Referral source Nurse (RN/NT/LPN)  OnCall Visit Yes   Chaplain was paged to provide some support for the pt's mom. Since the pt's identity has just been confirmed, his mother has now been contacted. When Chaplain was there, his mom was not present yet. Chaplain will continue to follow up.   M.Mike Sellers Resident [phone]

## 2024-07-04 ENCOUNTER — Inpatient Hospital Stay (HOSPITAL_COMMUNITY)

## 2024-07-04 DIAGNOSIS — I469 Cardiac arrest, cause unspecified: Secondary | ICD-10-CM

## 2024-07-04 DIAGNOSIS — J9601 Acute respiratory failure with hypoxia: Secondary | ICD-10-CM | POA: Diagnosis not present

## 2024-07-04 DIAGNOSIS — I6782 Cerebral ischemia: Secondary | ICD-10-CM | POA: Diagnosis not present

## 2024-07-04 LAB — POCT I-STAT 7, (LYTES, BLD GAS, ICA,H+H)
Acid-Base Excess: 0 mmol/L (ref 0.0–2.0)
Acid-Base Excess: 1 mmol/L (ref 0.0–2.0)
Acid-Base Excess: 2 mmol/L (ref 0.0–2.0)
Acid-Base Excess: 2 mmol/L (ref 0.0–2.0)
Acid-Base Excess: 4 mmol/L — ABNORMAL HIGH (ref 0.0–2.0)
Acid-Base Excess: 5 mmol/L — ABNORMAL HIGH (ref 0.0–2.0)
Bicarbonate: 24.7 mmol/L (ref 20.0–28.0)
Bicarbonate: 24.7 mmol/L (ref 20.0–28.0)
Bicarbonate: 25.5 mmol/L (ref 20.0–28.0)
Bicarbonate: 25.9 mmol/L (ref 20.0–28.0)
Bicarbonate: 26.6 mmol/L (ref 20.0–28.0)
Bicarbonate: 28.7 mmol/L — ABNORMAL HIGH (ref 20.0–28.0)
Calcium, Ion: 1.13 mmol/L — ABNORMAL LOW (ref 1.15–1.40)
Calcium, Ion: 1.18 mmol/L (ref 1.15–1.40)
Calcium, Ion: 1.18 mmol/L (ref 1.15–1.40)
Calcium, Ion: 1.19 mmol/L (ref 1.15–1.40)
Calcium, Ion: 1.19 mmol/L (ref 1.15–1.40)
Calcium, Ion: 1.2 mmol/L (ref 1.15–1.40)
HCT: 32 % — ABNORMAL LOW (ref 39.0–52.0)
HCT: 33 % — ABNORMAL LOW (ref 39.0–52.0)
HCT: 35 % — ABNORMAL LOW (ref 39.0–52.0)
HCT: 36 % — ABNORMAL LOW (ref 39.0–52.0)
HCT: 37 % — ABNORMAL LOW (ref 39.0–52.0)
HCT: 38 % — ABNORMAL LOW (ref 39.0–52.0)
Hemoglobin: 10.9 g/dL — ABNORMAL LOW (ref 13.0–17.0)
Hemoglobin: 11.2 g/dL — ABNORMAL LOW (ref 13.0–17.0)
Hemoglobin: 11.9 g/dL — ABNORMAL LOW (ref 13.0–17.0)
Hemoglobin: 12.2 g/dL — ABNORMAL LOW (ref 13.0–17.0)
Hemoglobin: 12.6 g/dL — ABNORMAL LOW (ref 13.0–17.0)
Hemoglobin: 12.9 g/dL — ABNORMAL LOW (ref 13.0–17.0)
O2 Saturation: 100 %
O2 Saturation: 100 %
O2 Saturation: 100 %
O2 Saturation: 100 %
O2 Saturation: 100 %
O2 Saturation: 96 %
Patient temperature: 37
Patient temperature: 37
Patient temperature: 37.3
Patient temperature: 37.3
Patient temperature: 37.4
Patient temperature: 37.5
Potassium: 2.9 mmol/L — ABNORMAL LOW (ref 3.5–5.1)
Potassium: 3.4 mmol/L — ABNORMAL LOW (ref 3.5–5.1)
Potassium: 3.5 mmol/L (ref 3.5–5.1)
Potassium: 3.5 mmol/L (ref 3.5–5.1)
Potassium: 3.7 mmol/L (ref 3.5–5.1)
Potassium: 3.9 mmol/L (ref 3.5–5.1)
Sodium: 138 mmol/L (ref 135–145)
Sodium: 139 mmol/L (ref 135–145)
Sodium: 139 mmol/L (ref 135–145)
Sodium: 139 mmol/L (ref 135–145)
Sodium: 140 mmol/L (ref 135–145)
Sodium: 143 mmol/L (ref 135–145)
TCO2: 26 mmol/L (ref 22–32)
TCO2: 26 mmol/L (ref 22–32)
TCO2: 26 mmol/L (ref 22–32)
TCO2: 27 mmol/L (ref 22–32)
TCO2: 28 mmol/L (ref 22–32)
TCO2: 30 mmol/L (ref 22–32)
pCO2 arterial: 34.1 mmHg (ref 32–48)
pCO2 arterial: 34.1 mmHg (ref 32–48)
pCO2 arterial: 34.3 mmHg (ref 32–48)
pCO2 arterial: 36.5 mmHg (ref 32–48)
pCO2 arterial: 37.4 mmHg (ref 32–48)
pCO2 arterial: 39.4 mmHg (ref 32–48)
pH, Arterial: 7.406 (ref 7.35–7.45)
pH, Arterial: 7.46 — ABNORMAL HIGH (ref 7.35–7.45)
pH, Arterial: 7.467 — ABNORMAL HIGH (ref 7.35–7.45)
pH, Arterial: 7.483 — ABNORMAL HIGH (ref 7.35–7.45)
pH, Arterial: 7.494 — ABNORMAL HIGH (ref 7.35–7.45)
pH, Arterial: 7.5 — ABNORMAL HIGH (ref 7.35–7.45)
pO2, Arterial: 321 mmHg — ABNORMAL HIGH (ref 83–108)
pO2, Arterial: 362 mmHg — ABNORMAL HIGH (ref 83–108)
pO2, Arterial: 411 mmHg — ABNORMAL HIGH (ref 83–108)
pO2, Arterial: 412 mmHg — ABNORMAL HIGH (ref 83–108)
pO2, Arterial: 430 mmHg — ABNORMAL HIGH (ref 83–108)
pO2, Arterial: 81 mmHg — ABNORMAL LOW (ref 83–108)

## 2024-07-04 LAB — CBC
HCT: 36 % — ABNORMAL LOW (ref 39.0–52.0)
HCT: 37.5 % — ABNORMAL LOW (ref 39.0–52.0)
HCT: 39.5 % (ref 39.0–52.0)
HCT: 39.5 % (ref 39.0–52.0)
HCT: 41.8 % (ref 39.0–52.0)
Hemoglobin: 12.2 g/dL — ABNORMAL LOW (ref 13.0–17.0)
Hemoglobin: 12.8 g/dL — ABNORMAL LOW (ref 13.0–17.0)
Hemoglobin: 13.1 g/dL (ref 13.0–17.0)
Hemoglobin: 13.2 g/dL (ref 13.0–17.0)
Hemoglobin: 14.1 g/dL (ref 13.0–17.0)
MCH: 28.4 pg (ref 26.0–34.0)
MCH: 28.5 pg (ref 26.0–34.0)
MCH: 28.5 pg (ref 26.0–34.0)
MCH: 28.7 pg (ref 26.0–34.0)
MCH: 28.9 pg (ref 26.0–34.0)
MCHC: 33.2 g/dL (ref 30.0–36.0)
MCHC: 33.4 g/dL (ref 30.0–36.0)
MCHC: 33.7 g/dL (ref 30.0–36.0)
MCHC: 33.9 g/dL (ref 30.0–36.0)
MCHC: 34.1 g/dL (ref 30.0–36.0)
MCV: 84.1 fL (ref 80.0–100.0)
MCV: 84.7 fL (ref 80.0–100.0)
MCV: 84.7 fL (ref 80.0–100.0)
MCV: 85.3 fL (ref 80.0–100.0)
MCV: 85.9 fL (ref 80.0–100.0)
Platelets: 206 K/uL (ref 150–400)
Platelets: 206 K/uL (ref 150–400)
Platelets: 226 K/uL (ref 150–400)
Platelets: 242 K/uL (ref 150–400)
Platelets: 249 K/uL (ref 150–400)
RBC: 4.25 MIL/uL (ref 4.22–5.81)
RBC: 4.43 MIL/uL (ref 4.22–5.81)
RBC: 4.6 MIL/uL (ref 4.22–5.81)
RBC: 4.63 MIL/uL (ref 4.22–5.81)
RBC: 4.97 MIL/uL (ref 4.22–5.81)
RDW: 13.1 % (ref 11.5–15.5)
RDW: 13.1 % (ref 11.5–15.5)
RDW: 13.2 % (ref 11.5–15.5)
RDW: 13.2 % (ref 11.5–15.5)
RDW: 13.2 % (ref 11.5–15.5)
WBC: 11 K/uL — ABNORMAL HIGH (ref 4.0–10.5)
WBC: 8.4 K/uL (ref 4.0–10.5)
WBC: 8.6 K/uL (ref 4.0–10.5)
WBC: 8.9 K/uL (ref 4.0–10.5)
WBC: 9.7 K/uL (ref 4.0–10.5)
nRBC: 0 % (ref 0.0–0.2)
nRBC: 0 % (ref 0.0–0.2)
nRBC: 0 % (ref 0.0–0.2)
nRBC: 0.2 % (ref 0.0–0.2)
nRBC: 0.3 % — ABNORMAL HIGH (ref 0.0–0.2)

## 2024-07-04 LAB — PROTIME-INR
INR: 1.1 (ref 0.8–1.2)
INR: 1.1 (ref 0.8–1.2)
INR: 1.1 (ref 0.8–1.2)
INR: 1.1 (ref 0.8–1.2)
INR: 1.1 (ref 0.8–1.2)
Prothrombin Time: 14.3 s (ref 11.4–15.2)
Prothrombin Time: 14.3 s (ref 11.4–15.2)
Prothrombin Time: 14.4 s (ref 11.4–15.2)
Prothrombin Time: 14.4 s (ref 11.4–15.2)
Prothrombin Time: 14.6 s (ref 11.4–15.2)

## 2024-07-04 LAB — ECHOCARDIOGRAM COMPLETE
AR max vel: 2.8 cm2
AV Area VTI: 2.69 cm2
AV Area mean vel: 2.64 cm2
AV Mean grad: 4 mmHg
AV Peak grad: 7.1 mmHg
Ao pk vel: 1.34 m/s
Area-P 1/2: 3.99 cm2
Height: 68 in
S' Lateral: 3 cm
Weight: 3947.12 [oz_av]

## 2024-07-04 LAB — COMPREHENSIVE METABOLIC PANEL WITH GFR
ALT: 244 U/L — ABNORMAL HIGH (ref 0–44)
ALT: 249 U/L — ABNORMAL HIGH (ref 0–44)
ALT: 253 U/L — ABNORMAL HIGH (ref 0–44)
ALT: 286 U/L — ABNORMAL HIGH (ref 0–44)
ALT: 318 U/L — ABNORMAL HIGH (ref 0–44)
AST: 105 U/L — ABNORMAL HIGH (ref 15–41)
AST: 82 U/L — ABNORMAL HIGH (ref 15–41)
AST: 83 U/L — ABNORMAL HIGH (ref 15–41)
AST: 87 U/L — ABNORMAL HIGH (ref 15–41)
AST: 96 U/L — ABNORMAL HIGH (ref 15–41)
Albumin: 2.3 g/dL — ABNORMAL LOW (ref 3.5–5.0)
Albumin: 2.3 g/dL — ABNORMAL LOW (ref 3.5–5.0)
Albumin: 2.4 g/dL — ABNORMAL LOW (ref 3.5–5.0)
Albumin: 2.6 g/dL — ABNORMAL LOW (ref 3.5–5.0)
Albumin: 2.8 g/dL — ABNORMAL LOW (ref 3.5–5.0)
Alkaline Phosphatase: 57 U/L (ref 38–126)
Alkaline Phosphatase: 59 U/L (ref 38–126)
Alkaline Phosphatase: 61 U/L (ref 38–126)
Alkaline Phosphatase: 72 U/L (ref 38–126)
Alkaline Phosphatase: 73 U/L (ref 38–126)
Anion gap: 10 (ref 5–15)
Anion gap: 11 (ref 5–15)
Anion gap: 13 (ref 5–15)
Anion gap: 8 (ref 5–15)
Anion gap: 9 (ref 5–15)
BUN: 14 mg/dL (ref 6–20)
BUN: 15 mg/dL (ref 6–20)
BUN: 15 mg/dL (ref 6–20)
BUN: 15 mg/dL (ref 6–20)
BUN: 17 mg/dL (ref 6–20)
CO2: 23 mmol/L (ref 22–32)
CO2: 24 mmol/L (ref 22–32)
CO2: 24 mmol/L (ref 22–32)
CO2: 25 mmol/L (ref 22–32)
CO2: 25 mmol/L (ref 22–32)
Calcium: 7.8 mg/dL — ABNORMAL LOW (ref 8.9–10.3)
Calcium: 8.2 mg/dL — ABNORMAL LOW (ref 8.9–10.3)
Calcium: 8.3 mg/dL — ABNORMAL LOW (ref 8.9–10.3)
Calcium: 8.4 mg/dL — ABNORMAL LOW (ref 8.9–10.3)
Calcium: 8.8 mg/dL — ABNORMAL LOW (ref 8.9–10.3)
Chloride: 100 mmol/L (ref 98–111)
Chloride: 101 mmol/L (ref 98–111)
Chloride: 105 mmol/L (ref 98–111)
Chloride: 107 mmol/L (ref 98–111)
Chloride: 109 mmol/L (ref 98–111)
Creatinine, Ser: 0.79 mg/dL (ref 0.61–1.24)
Creatinine, Ser: 0.81 mg/dL (ref 0.61–1.24)
Creatinine, Ser: 0.85 mg/dL (ref 0.61–1.24)
Creatinine, Ser: 0.86 mg/dL (ref 0.61–1.24)
Creatinine, Ser: 0.92 mg/dL (ref 0.61–1.24)
GFR, Estimated: >60 mL/min (ref >60)
GFR, Estimated: >60 mL/min (ref >60)
GFR, Estimated: >60 mL/min (ref >60)
GFR, Estimated: >60 mL/min (ref >60)
GFR, Estimated: >60 mL/min (ref >60)
Glucose, Bld: 100 mg/dL — ABNORMAL HIGH (ref 70–99)
Glucose, Bld: 101 mg/dL — ABNORMAL HIGH (ref 70–99)
Glucose, Bld: 105 mg/dL — ABNORMAL HIGH (ref 70–99)
Glucose, Bld: 94 mg/dL (ref 70–99)
Glucose, Bld: 94 mg/dL (ref 70–99)
Potassium: 3.1 mmol/L — ABNORMAL LOW (ref 3.5–5.1)
Potassium: 3.3 mmol/L — ABNORMAL LOW (ref 3.5–5.1)
Potassium: 3.5 mmol/L (ref 3.5–5.1)
Potassium: 3.6 mmol/L (ref 3.5–5.1)
Potassium: 3.8 mmol/L (ref 3.5–5.1)
Sodium: 136 mmol/L (ref 135–145)
Sodium: 138 mmol/L (ref 135–145)
Sodium: 138 mmol/L (ref 135–145)
Sodium: 140 mmol/L (ref 135–145)
Sodium: 142 mmol/L (ref 135–145)
Total Bilirubin: 0.4 mg/dL (ref 0.0–1.2)
Total Bilirubin: 0.5 mg/dL (ref 0.0–1.2)
Total Bilirubin: 0.5 mg/dL (ref 0.0–1.2)
Total Bilirubin: 0.6 mg/dL (ref 0.0–1.2)
Total Bilirubin: 0.8 mg/dL (ref 0.0–1.2)
Total Protein: 5 g/dL — ABNORMAL LOW (ref 6.5–8.1)
Total Protein: 5.1 g/dL — ABNORMAL LOW (ref 6.5–8.1)
Total Protein: 5.2 g/dL — ABNORMAL LOW (ref 6.5–8.1)
Total Protein: 5.8 g/dL — ABNORMAL LOW (ref 6.5–8.1)
Total Protein: 6.2 g/dL — ABNORMAL LOW (ref 6.5–8.1)

## 2024-07-04 LAB — PHOSPHORUS
Phosphorus: 3.2 mg/dL (ref 2.5–4.6)
Phosphorus: 3.3 mg/dL (ref 2.5–4.6)
Phosphorus: 3.3 mg/dL (ref 2.5–4.6)
Phosphorus: 4.3 mg/dL (ref 2.5–4.6)
Phosphorus: 5 mg/dL — ABNORMAL HIGH (ref 2.5–4.6)

## 2024-07-04 LAB — URINALYSIS, ROUTINE W REFLEX MICROSCOPIC
Bilirubin Urine: NEGATIVE
Glucose, UA: NEGATIVE mg/dL
Hgb urine dipstick: NEGATIVE
Ketones, ur: NEGATIVE mg/dL
Leukocytes,Ua: NEGATIVE
Nitrite: NEGATIVE
Protein, ur: NEGATIVE mg/dL
Specific Gravity, Urine: 1.018 (ref 1.005–1.030)
pH: 8 (ref 5.0–8.0)

## 2024-07-04 LAB — APTT
aPTT: 26 s (ref 24–36)
aPTT: 31 s (ref 24–36)
aPTT: 31 s (ref 24–36)
aPTT: 31 s (ref 24–36)
aPTT: 31 s (ref 24–36)

## 2024-07-04 LAB — RESP PANEL BY RT-PCR (RSV, FLU A&B, COVID)  RVPGX2
Influenza A by PCR: NEGATIVE
Influenza B by PCR: NEGATIVE
Resp Syncytial Virus by PCR: NEGATIVE
SARS Coronavirus 2 by RT PCR: NEGATIVE

## 2024-07-04 LAB — BILIRUBIN, DIRECT
Bilirubin, Direct: 0.1 mg/dL (ref 0.0–0.2)
Bilirubin, Direct: 0.2 mg/dL (ref 0.0–0.2)
Bilirubin, Direct: 0.2 mg/dL (ref 0.0–0.2)
Bilirubin, Direct: 0.2 mg/dL (ref 0.0–0.2)
Bilirubin, Direct: 0.2 mg/dL (ref 0.0–0.2)

## 2024-07-04 LAB — TROPONIN I (HIGH SENSITIVITY)
Troponin I (High Sensitivity): 53 ng/L — ABNORMAL HIGH (ref <18)
Troponin I (High Sensitivity): 42 ng/L — ABNORMAL HIGH (ref <18)
Troponin I (High Sensitivity): 34 ng/L — ABNORMAL HIGH (ref <18)

## 2024-07-04 LAB — FIBRINOGEN
Fibrinogen: 556 mg/dL — ABNORMAL HIGH (ref 210–475)
Fibrinogen: 608 mg/dL — ABNORMAL HIGH (ref 210–475)
Fibrinogen: 618 mg/dL — ABNORMAL HIGH (ref 210–475)
Fibrinogen: 708 mg/dL — ABNORMAL HIGH (ref 210–475)
Fibrinogen: 789 mg/dL — ABNORMAL HIGH (ref 210–475)

## 2024-07-04 LAB — GLUCOSE, CAPILLARY
Glucose-Capillary: 95 mg/dL (ref 70–99)
Glucose-Capillary: 89 mg/dL (ref 70–99)
Glucose-Capillary: 99 mg/dL (ref 70–99)
Glucose-Capillary: 83 mg/dL (ref 70–99)
Glucose-Capillary: 97 mg/dL (ref 70–99)
Glucose-Capillary: 95 mg/dL (ref 70–99)

## 2024-07-04 LAB — MAGNESIUM
Magnesium: 1.6 mg/dL — ABNORMAL LOW (ref 1.7–2.4)
Magnesium: 1.7 mg/dL (ref 1.7–2.4)
Magnesium: 1.7 mg/dL (ref 1.7–2.4)
Magnesium: 1.8 mg/dL (ref 1.7–2.4)
Magnesium: 1.9 mg/dL (ref 1.7–2.4)
Magnesium: 2 mg/dL (ref 1.7–2.4)

## 2024-07-04 LAB — CK TOTAL AND CKMB (NOT AT ARMC)
CK, MB: 2.2 ng/mL (ref 0.5–5.0)
Total CK: 422 U/L — ABNORMAL HIGH (ref 49–397)

## 2024-07-04 LAB — LACTIC ACID, PLASMA
Lactic Acid, Venous: 0.7 mmol/L (ref 0.5–1.9)
Lactic Acid, Venous: 1 mmol/L (ref 0.5–1.9)

## 2024-07-04 LAB — AMYLASE
Amylase: 54 U/L (ref 28–100)
Amylase: 66 U/L (ref 28–100)
Amylase: 66 U/L (ref 28–100)

## 2024-07-04 LAB — CK
Total CK: 324 U/L (ref 49–397)
Total CK: 287 U/L (ref 49–397)

## 2024-07-04 LAB — MRSA NEXT GEN BY PCR, NASAL: MRSA by PCR Next Gen: NOT DETECTED

## 2024-07-04 LAB — LIPASE, BLOOD
Lipase: 74 U/L — ABNORMAL HIGH (ref 11–51)
Lipase: 84 U/L — ABNORMAL HIGH (ref 11–51)
Lipase: 81 U/L — ABNORMAL HIGH (ref 11–51)

## 2024-07-04 MED ORDER — ACETAMINOPHEN 325 MG PO TABS
650.0000 mg | ORAL_TABLET | Freq: Four times a day (QID) | ORAL | Status: DC | PRN
  Administered 2024-07-04: 650 mg
  Filled 2024-07-04: qty 2

## 2024-07-04 MED ORDER — POTASSIUM CHLORIDE 20 MEQ PO PACK
40.0000 meq | PACK | ORAL | Status: AC
  Administered 2024-07-04 (×2): 40 meq
  Filled 2024-07-04 (×2): qty 2

## 2024-07-04 MED ORDER — FUROSEMIDE 10 MG/ML IJ SOLN
60.0000 mg | Freq: Three times a day (TID) | INTRAMUSCULAR | Status: AC
  Administered 2024-07-04 (×2): 60 mg via INTRAVENOUS
  Filled 2024-07-04 (×2): qty 6

## 2024-07-04 MED ORDER — DEXTROSE IN LACTATED RINGERS 5 % IV SOLN: INTRAVENOUS | Status: DC

## 2024-07-04 MED ORDER — POTASSIUM CHLORIDE 10 MEQ/50ML IV SOLN
10.0000 meq | INTRAVENOUS | Status: AC
  Administered 2024-07-04 – 2024-07-05 (×4): 10 meq via INTRAVENOUS
  Filled 2024-07-04 (×4): qty 50

## 2024-07-04 MED ORDER — MAGNESIUM SULFATE 4 GM/100ML IV SOLN
4.0000 g | Freq: Once | INTRAVENOUS | Status: AC
  Administered 2024-07-04: 4 g via INTRAVENOUS
  Filled 2024-07-04: qty 100

## 2024-07-04 MED ORDER — POTASSIUM CHLORIDE 20 MEQ PO PACK
40.0000 meq | PACK | Freq: Once | ORAL | Status: AC
  Administered 2024-07-04: 40 meq
  Filled 2024-07-04: qty 2

## 2024-07-04 NOTE — Progress Notes (Signed)
 Patient now appears to be decerebrate posturing when stimulated or turned. Bronch done at bedside per Dr Gretta, Honor bridge present.

## 2024-07-04 NOTE — Progress Notes (Signed)
 NAME:  Mike Sellers, MRN:  [id number], DOB:  Sep 17, 1991, LOS: 5 ADMISSION DATE:  06/29/2024 CONSULTATION DATE:  06/29/2024 REFERRING MD:  Tegeler - EDP, CHIEF COMPLAINT:  Post-cardiac arrest, found down   History of Present Illness:  Unknown age man who presented to [facility] ED via EMS 8/14 after OOH cardiac arrest with unknown downtime. PMHx unknown.  Patient was reportedly found down outside of the [employer] with drug paraphernalia on site (crack pipe, lighters) with wet clothing/personal belongings. Per police, a Lexicographer reported seeing patient down around his arrival to work at 0400; at that time patient was breathing. On EMS arrival, patient was pulseless and apneic. Narcan was given without significant response. Initial rhythm was asystole and patient received CPR x 13 minutes, Epi x 2 with ROSC. Arrived to ED on Epi gtt. LMA placed. Intubated on arrival, no reported signs of responsiveness en route. On ED arrival, patient was afebrile, HR 80s, RR 25, BP 113/55 on Epi gtt, SpO2 100%. GCS 3.   Labs were notable for WBC 25.8, Hgb 14.7, Plt 231. INR 1.4. Na 145, K 3.9, CO2 16, BUN/Cr 11/2.57. Phos > 30. Mg 3.0. AST/ALT 872/1256, Alk Phos 99, Tbili 0.7. Trop 110. UA gluc 50, mod Hgb, prot 100. UDS +amphetamines. Initial CXR unremarkable. CT Head NAICA, CT C-Spine negative for acute traumatic injury.  PCCM consulted for ICU admission.  Pertinent Medical History:  History reviewed. No pertinent past medical history.  Significant Hospital Events: Including procedures, antibiotic start and stop dates in addition to other pertinent events   8/14 - Presented to [facility] ED as OOH cardiac arrest, [name] with unknown downtime. Initial rhythm asystole, CPR x 13 min, Epi x 2, Epi gtt. Intubated on ED arrival and NE started. CXR unremarkable. Labs/imaging pending. 8/15 neuro exam poor, myclonus overnight, on 1mcg norepi 8/18 No improvement in neuro exam, police able to ID and mom coming in 8/19  Family has requested organ donation  Interim History / Subjective:  No change in neurologic status   Objective:  Blood pressure (!) 144/74, pulse 82, temperature 99.3 F (37.4 C), resp. rate (!) 28, height 5' 8 (1.727 m), weight 111.9 kg, SpO2 99%.    Vent Mode: PRVC FiO2 (%):  [40 %] 40 % Set Rate:  [18 bmp] 18 bmp Vt Set:  [500 mL] 500 mL PEEP:  [8 cmH20] 8 cmH20 Plateau Pressure:  [14 cmH20-22 cmH20] 19 cmH20   Intake/Output Summary (Last 24 hours) at 07/04/2024 0848 Last data filed at 07/04/2024 0800 Gross per 24 hour  Intake 3260.97 ml  Output 3980 ml  Net -719.03 ml   Filed Weights   07/02/24 0500 07/03/24 0500 07/04/24 0350  Weight: 114.1 kg 116.3 kg 111.9 kg   General:  well nourished M, intubated and sedated  HEENT: MM pink/moist, sclera anicteric Neuro: pupils equal and responsive, examined off propofol  and on minimal fentanyl , + gag and triggering vent,not following commands and does not withdraw to pain + corneals, intermittent myoclonus and shivering  CV: s1s2 rrr, no m/r/g PULM:  clear bilaterally and synchronous with vent  GI: soft, non-distended  Extremities: warm/dry, upper extremity edema      Resolved Hospital Problem List:  AKI  Assessment & Plan:    Post-cardiac arrest, out of hospital with unknown downtime Undifferentiated shock, cardiogenic versus septic component Hypoglycemia Ischemic brain injury Suspect hypoxic arrest in the setting of drug use, ?aspiration. - continue  Targeted temperature management/normothermia protocol - Goal MAP > 65, intermittently on  pressors  - Fluid resuscitation as tolerated - Echo with EF 55-60%, RV systolic function mild to moderately reduced with mild RV enlargement, small pericardial effusion  -continue Unasyn  -continue D5, LR, has had some vomiting with TF -family confirms that he would not want a trach and peg and prolonged life support, have requested to speak with honor bridge   Acute hypoxemic  respiratory failure in the setting of cardiac arrest Concern for aspiration PNA - Continue full vent support (4-8cc/kg IBW) - Wean FiO2 for O2 sat > 90% - Daily WUA/SBT once appropriate, not clinically appropriate at this time due to poor mental status and concern for sx - VAP bundle - Pulmonary hygiene - PAD protocol for sedation: Propofol  and Fentanyl  for goal RASS 0 to -1; off sedation at present and remains unresponsive - Follow CXR, ABG - Unasyn  for aspiration coverage   Hypomagnesemia Hypophosphatemia  - Trend BMP - Replete electrolytes as indicated - Monitor I&Os - Avoid nephrotoxic agents as able - Ensure adequate renal perfusion  Shock liver - LFT's improving   Substance abuse Received Narcan with EMS with no response. - UDS +amphetamines   Best Practice: (right click and Reselect all SmartList Selections daily)   Diet/type: NPO DVT prophylaxis: SCDs, SQH GI prophylaxis: PPI Lines: Central line and Arterial Line Foley:  Yes, and it is still needed Code Status:  full code Last date of multidisciplinary goals of care discussion [see IPAL]  Labs:  CBC: Recent Labs  Lab 07/01/24 0348 07/02/24 0330 07/03/24 0146 07/03/24 1501 07/03/24 2353 07/04/24 0005 07/04/24 0340 07/04/24 0457 07/04/24 0821  WBC 12.4* 11.3* 11.1*  --   --  9.7  --  8.4  --   HGB 14.6 13.5 13.6   < > 12.2* 13.1 11.9* 12.8* 10.9*  HCT 44.3 41.2 41.2   < > 36.0* 39.5 35.0* 37.5* 32.0*  MCV 87.0 87.1 85.7  --   --  85.9  --  84.7  --   PLT 158 166 222  --   --  226  --  206  --    < > = values in this interval not displayed.   Basic Metabolic Panel: Recent Labs  Lab 07/01/24 0348 07/02/24 0330 07/03/24 0146 07/03/24 1501 07/03/24 1636 07/03/24 2353 07/04/24 0005 07/04/24 0047 07/04/24 0340 07/04/24 0457 07/04/24 0631 07/04/24 0821  NA 141 137 136   < > 139 143 140  --  140 142  --  139  K 4.2 3.8 3.3*   < > 3.4* 2.9* 3.1*  --  3.5 3.5  --  3.9  CL 109 108 107  --  108   --  105  --   --  109  --   --   CO2 26 22 23   --  24  --  24  --   --  24  --   --   GLUCOSE 87 137* 117*  --  104*  --  105*  --   --  101*  --   --   BUN 17 14 14   --  15  --  17  --   --  15  --   --   CREATININE 1.04 0.91 0.70  --  0.76  --  0.86  --   --  0.81  --   --   CALCIUM  8.0* 8.0* 8.0*  --  7.8*  --  8.3*  --   --  8.4*  --   --  MG 1.9 1.7 1.5*  --  1.8  --   --  1.7  --  1.6* 1.7  --   PHOS 3.2 2.4* 3.0  --   --   --   --   --   --  3.2 3.3  --    < > = values in this interval not displayed.   GFR: Estimated Creatinine Clearance: 158.9 mL/min (by C-G formula based on SCr of 0.81 mg/dL). Recent Labs  Lab 06/29/24 1341 06/29/24 1558 06/30/24 0352 07/02/24 0330 07/03/24 0146 07/04/24 0005 07/04/24 0457 07/04/24 0458  WBC  --   --    < > 11.3* 11.1* 9.7 8.4  --   LATICACIDVEN 3.7* 2.9*  --   --   --  1.0  --  0.7   < > = values in this interval not displayed.   Liver Function Tests: Recent Labs  Lab 06/30/24 0352 06/30/24 1208 07/01/24 0348 07/02/24 0330 07/04/24 0005 07/04/24 0457  AST 840*  --  458* 270* 105* 96*  ALT 1,408*  --  1,069* 691* 318* 286*  ALKPHOS 64  --  61 66 61 59  BILITOT 0.8  --  0.7 0.7 0.5 0.5  PROT 5.1*  --  5.2* 5.3* 5.2* 5.1*  ALBUMIN  2.7* 2.6* 2.6* 2.4* 2.4* 2.3*   No results for input(s): LIPASE, AMYLASE in the last 168 hours. Recent Labs  Lab 07/01/24 0348  AMMONIA 36*    ABG:    Component Value Date/Time   PHART 7.483 (H) 07/04/2024 0821   PCO2ART 34.1 07/04/2024 0821   PO2ART 362 (H) 07/04/2024 0821   HCO3 25.5 07/04/2024 0821   TCO2 26 07/04/2024 0821   ACIDBASEDEF 1.0 06/30/2024 0043   O2SAT 100 07/04/2024 0821    Coagulation Profile: Recent Labs  Lab 06/29/24 0825 06/30/24 0352 07/04/24 0005 07/04/24 0457  INR 1.4* 1.3* 1.1 1.1   Cardiac Enzymes: Recent Labs  Lab 06/30/24 1208 07/04/24 0631  CKTOTAL 4,495* 422*  CKMB  --  2.2    HbA1C: Hgb A1c MFr Bld  Date/Time Value Ref Range Status   06/30/2024 03:52 AM 5.6 4.8 - 5.6 % Final    Comment:    (NOTE) Diagnosis of Diabetes The following HbA1c ranges recommended by the American Diabetes Association (ADA) may be used as an aid in the diagnosis of diabetes mellitus.  Hemoglobin             Suggested A1C NGSP%              Diagnosis  <5.7                   Non Diabetic  5.7-6.4                Pre-Diabetic  >6.4                   Diabetic  <7.0                   Glycemic control for                       adults with diabetes.      CBG: Recent Labs  Lab 07/03/24 1638 07/03/24 1941 07/03/24 2304 07/04/24 0340 07/04/24 0808  GLUCAP 98 105* 106* 95 89   Review of Systems:   Patient is encephalopathic and/or intubated; therefore, history has been obtained from chart review.   Past Medical History:  He,  has no past medical history on file.   Surgical History:  History reviewed. No pertinent surgical history.   Social History:   reports current drug use.   Family History:  His family history is not on file.   Allergies: Not on File   Home Medications: Prior to Admission medications   Not on File    Critical care time: 32 mins    CRITICAL CARE Performed by: [name] Lacharles Altschuler   Total critical care time: 32 minutes  Critical care time was exclusive of separately billable procedures and treating other patients.  Critical care was necessary to treat or prevent imminent or life-threatening deterioration.  Critical care was time spent personally by me on the following activities: development of treatment plan with patient and/or surrogate as well as nursing, discussions with consultants, evaluation of patient's response to treatment, examination of patient, obtaining history from patient or surrogate, ordering and performing treatments and interventions, ordering and review of laboratory studies, ordering and review of radiographic studies, pulse oximetry and re-evaluation of patient's  condition.   [name] Deshawna Mcneece, PA-C  Pulmonary & Critical care See Amion for pager If no response to pager , please call [phone] until 7pm After 7:00 pm call Elink  [phone]

## 2024-07-04 NOTE — Plan of Care (Signed)
  Problem: Fluid Volume: Goal: Ability to maintain a balanced intake and output will improve Outcome: Progressing   Problem: Metabolic: Goal: Ability to maintain appropriate glucose levels will improve Outcome: Progressing   Problem: Nutritional: Goal: Maintenance of adequate nutrition will improve Outcome: Progressing   Problem: Tissue Perfusion: Goal: Adequacy of tissue perfusion will improve Outcome: Progressing

## 2024-07-04 NOTE — Progress Notes (Signed)
 [facility] ADULT ICU REPLACEMENT PROTOCOL   The patient does apply for the [facility] Adult ICU Electrolyte Replacment Protocol based on the criteria listed below:   1.Exclusion criteria: TCTS, ECMO, Dialysis, and Myasthenia Gravis patients 2. Is GFR >/= 30 ml/min? No.  Patient's GFR today is >60 3. Is SCr </= 2? Yes.   Patient's SCr is 0.81 mg/dL 4. Did SCr increase >/= 0.5 in 24 hours? No. 5.Pt's weight >40kg  Yes.   6. Abnormal electrolyte(s): Mag 1.6  7. Electrolytes replaced per protocol 8.  Call MD STAT for K+ </= 2.5, Phos </= 1, or Mag </= 1 Physician:  Dr. Mallory Breed  Mike Sellers ORN 07/04/2024 6:22 AM

## 2024-07-04 NOTE — Progress Notes (Signed)
 Patient exhibiting myoclonus and decorticate posturing during assessment. Versed  given. Will resume propofol  per L Gleason PA.

## 2024-07-04 NOTE — Progress Notes (Signed)
 eLink Physician-Brief Progress Note Patient Name: Mike Sellers DOB: 05-12-1991 MRN: [id number]   Date of Service  07/04/2024  HPI/Events of Note  K+ 3.3, Cr 0.85, patient is due to receive a dose of Lasix .  eICU Interventions  KCL 10 meq iv Q 1 hour x 4 doses + KCL 40 meq via tube x 1.        Marcellina RAYMOND Dub 07/04/2024, 9:28 PM

## 2024-07-04 NOTE — Progress Notes (Signed)
 [facility] ADULT ICU REPLACEMENT PROTOCOL   The patient does apply for the [facility] Adult ICU Electrolyte Replacment Protocol based on the criteria listed below:   1.Exclusion criteria: TCTS, ECMO, Dialysis, and Myasthenia Gravis patients 2. Is GFR >/= 30 ml/min? Yes.    Patient's GFR today is >60 3. Is SCr </= 2? Yes.   Patient's SCr is 0.86 mg/dL 4. Did SCr increase >/= 0.5 in 24 hours? No. 5.Pt's weight >40kg  Yes.   6. Abnormal electrolyte(s): K+3.1 7. Electrolytes replaced per protocol 8.  Call MD STAT for K+ </= 2.5, Phos </= 1, or Mag </= 1 Physician:  Dr. Mallory Breed  Henry Recardo ORN 07/04/2024 1:27 AM

## 2024-07-04 NOTE — Progress Notes (Signed)
 Recruitmant maneuver done per [facility] will obtain arterial blood gas in 30 minutes.

## 2024-07-04 NOTE — TOC Initial Note (Signed)
 Transition of Care [facility]) - Initial/Assessment Note    Patient Details  Name: Mike Sellers MRN: [id number] Date of Birth: 01-05-1991  [facility] CM/SW Contact:    Justina Delcia Czar, RN Phone Number: [phone] 07/04/2024, 2:33 PM  Clinical Narrative:                  Spoke to pt's mother at bedside. States she has set pt up as organ donor. Did not express any needs. She has spoke to Chaplain.     Expected Discharge Plan:  (hospital death) Barriers to Discharge: Continued Medical Work up   Patient Goals and CMS Choice            Expected Discharge Plan and Services   Discharge Planning Services: CM Consult                                          Prior Living Arrangements/Services                       Activities of Daily Living      Permission Sought/Granted                  Emotional Assessment   Attitude/Demeanor/Rapport: Intubated (Following Commands or Not Following Commands)          Admission diagnosis:  Cardiac arrest [facility]) [I46.9] Patient Active Problem List   Diagnosis Date Noted   Cardiac arrest (HCC) 06/29/2024   PCP:  Patient, No Pcp Per Pharmacy:  No Pharmacies Listed    Social Drivers of Health (SDOH) Social History: SDOH Screenings   Food Insecurity: Patient Unable To Answer (06/29/2024)  Housing: Patient Unable To Answer (06/29/2024)  Transportation Needs: Patient Unable To Answer (06/29/2024)  Utilities: Patient Unable To Answer (06/29/2024)  Social Connections: Patient Unable To Answer (06/29/2024)   SDOH Interventions:     Readmission Risk Interventions     No data to display

## 2024-07-04 NOTE — Progress Notes (Signed)
*  PRELIMINARY RESULTS* Echocardiogram 2D Echocardiogram has been performed.  Mike Sellers 07/04/2024, 11:56 AM

## 2024-07-04 NOTE — Procedures (Signed)
 Bronchoscopy Procedure Note  Javarie Crisp  [id number]  09-05-1991  Date:07/04/24  Time:4:29 PM   Provider Performing:Rayli Wiederhold P Gretta   Procedure(s):  Flexible bronchoscopy with bronchial alveolar lavage [id number])  Indication(s) Honor Bridge evaluation  Consent Risks of the procedure as well as the alternatives and risks of each were explained to the patient and/or caregiver.  Consent for the procedure was obtained and is signed in the bedside chart  Anesthesia Per MAR   Time Out Verified patient identification, verified procedure, site/side was marked, verified correct patient position, special equipment/implants available, medications/allergies/relevant history reviewed, required imaging and test results available.   Sterile Technique Usual hand hygiene, masks, gowns, and gloves were used   Procedure Description Bronchoscope advanced through endotracheal tube and into airway.  Airways were examined down to subsegmental level with findings noted below.   Following diagnostic evaluation, BAL(s) performed in LLL with normal saline and return of cloudy fluid Thick endobronchial secretions suctioned out of LLL before BAL for culture. Findings: normal patent airways, mild erythema of airways.    Complications/Tolerance None; patient tolerated the procedure well. Chest X-ray is not needed post procedure.   EBL 0   Specimen(s) Covid sample to [facility]- from BAL Bronchial wash sample from LLL for culture   Leita SHAUNNA Gretta, DO 07/04/24 4:30 PM [facility]  For contact information, see Amion. If no response to pager, please call PCCM consult pager. After hours, 7PM- 7AM, please call Elink.

## 2024-07-05 ENCOUNTER — Inpatient Hospital Stay (HOSPITAL_COMMUNITY)

## 2024-07-05 DIAGNOSIS — J9601 Acute respiratory failure with hypoxia: Secondary | ICD-10-CM | POA: Diagnosis not present

## 2024-07-05 DIAGNOSIS — I469 Cardiac arrest, cause unspecified: Secondary | ICD-10-CM | POA: Diagnosis not present

## 2024-07-05 DIAGNOSIS — I6782 Cerebral ischemia: Secondary | ICD-10-CM | POA: Diagnosis not present

## 2024-07-05 DIAGNOSIS — G931 Anoxic brain damage, not elsewhere classified: Secondary | ICD-10-CM | POA: Diagnosis not present

## 2024-07-05 LAB — POCT I-STAT 7, (LYTES, BLD GAS, ICA,H+H)
Acid-Base Excess: 5 mmol/L — ABNORMAL HIGH (ref 0.0–2.0)
Acid-Base Excess: 5 mmol/L — ABNORMAL HIGH (ref 0.0–2.0)
Acid-Base Excess: 4 mmol/L — ABNORMAL HIGH (ref 0.0–2.0)
Acid-Base Excess: 7 mmol/L — ABNORMAL HIGH (ref 0.0–2.0)
Acid-Base Excess: 5 mmol/L — ABNORMAL HIGH (ref 0.0–2.0)
Acid-Base Excess: 5 mmol/L — ABNORMAL HIGH (ref 0.0–2.0)
Bicarbonate: 28.3 mmol/L — ABNORMAL HIGH (ref 20.0–28.0)
Bicarbonate: 28.4 mmol/L — ABNORMAL HIGH (ref 20.0–28.0)
Bicarbonate: 27.5 mmol/L (ref 20.0–28.0)
Bicarbonate: 31.8 mmol/L — ABNORMAL HIGH (ref 20.0–28.0)
Bicarbonate: 29.3 mmol/L — ABNORMAL HIGH (ref 20.0–28.0)
Bicarbonate: 28.8 mmol/L — ABNORMAL HIGH (ref 20.0–28.0)
Calcium, Ion: 1.17 mmol/L (ref 1.15–1.40)
Calcium, Ion: 1.15 mmol/L (ref 1.15–1.40)
Calcium, Ion: 1.18 mmol/L (ref 1.15–1.40)
Calcium, Ion: 1.17 mmol/L (ref 1.15–1.40)
Calcium, Ion: 1.19 mmol/L (ref 1.15–1.40)
Calcium, Ion: 1.13 mmol/L — ABNORMAL LOW (ref 1.15–1.40)
HCT: 36 % — ABNORMAL LOW (ref 39.0–52.0)
HCT: 33 % — ABNORMAL LOW (ref 39.0–52.0)
HCT: 34 % — ABNORMAL LOW (ref 39.0–52.0)
HCT: 39 % (ref 39.0–52.0)
HCT: 37 % — ABNORMAL LOW (ref 39.0–52.0)
HCT: 37 % — ABNORMAL LOW (ref 39.0–52.0)
Hemoglobin: 12.2 g/dL — ABNORMAL LOW (ref 13.0–17.0)
Hemoglobin: 11.2 g/dL — ABNORMAL LOW (ref 13.0–17.0)
Hemoglobin: 11.6 g/dL — ABNORMAL LOW (ref 13.0–17.0)
Hemoglobin: 13.3 g/dL (ref 13.0–17.0)
Hemoglobin: 12.6 g/dL — ABNORMAL LOW (ref 13.0–17.0)
Hemoglobin: 12.6 g/dL — ABNORMAL LOW (ref 13.0–17.0)
O2 Saturation: 100 %
O2 Saturation: 100 %
O2 Saturation: 100 %
O2 Saturation: 100 %
O2 Saturation: 99 %
O2 Saturation: 100 %
Patient temperature: 98.6
Patient temperature: 37.3
Patient temperature: 37.1
Patient temperature: 99.1
Patient temperature: 37.8
Potassium: 3.7 mmol/L (ref 3.5–5.1)
Potassium: 3.7 mmol/L (ref 3.5–5.1)
Potassium: 3.8 mmol/L (ref 3.5–5.1)
Potassium: 3.7 mmol/L (ref 3.5–5.1)
Potassium: 3.6 mmol/L (ref 3.5–5.1)
Potassium: 3.5 mmol/L (ref 3.5–5.1)
Sodium: 139 mmol/L (ref 135–145)
Sodium: 138 mmol/L (ref 135–145)
Sodium: 138 mmol/L (ref 135–145)
Sodium: 138 mmol/L (ref 135–145)
Sodium: 139 mmol/L (ref 135–145)
Sodium: 140 mmol/L (ref 135–145)
TCO2: 29 mmol/L (ref 22–32)
TCO2: 30 mmol/L (ref 22–32)
TCO2: 29 mmol/L (ref 22–32)
TCO2: 33 mmol/L — ABNORMAL HIGH (ref 22–32)
TCO2: 31 mmol/L (ref 22–32)
TCO2: 30 mmol/L (ref 22–32)
pCO2 arterial: 36.2 mmHg (ref 32–48)
pCO2 arterial: 37.7 mmHg (ref 32–48)
pCO2 arterial: 37.6 mmHg (ref 32–48)
pCO2 arterial: 42.7 mmHg (ref 32–48)
pCO2 arterial: 43.2 mmHg (ref 32–48)
pCO2 arterial: 38.7 mmHg (ref 32–48)
pH, Arterial: 7.5 — ABNORMAL HIGH (ref 7.35–7.45)
pH, Arterial: 7.486 — ABNORMAL HIGH (ref 7.35–7.45)
pH, Arterial: 7.472 — ABNORMAL HIGH (ref 7.35–7.45)
pH, Arterial: 7.48 — ABNORMAL HIGH (ref 7.35–7.45)
pH, Arterial: 7.44 (ref 7.35–7.45)
pH, Arterial: 7.483 — ABNORMAL HIGH (ref 7.35–7.45)
pO2, Arterial: 435 mmHg — ABNORMAL HIGH (ref 83–108)
pO2, Arterial: 416 mmHg — ABNORMAL HIGH (ref 83–108)
pO2, Arterial: 386 mmHg — ABNORMAL HIGH (ref 83–108)
pO2, Arterial: 344 mmHg — ABNORMAL HIGH (ref 83–108)
pO2, Arterial: 119 mmHg — ABNORMAL HIGH (ref 83–108)
pO2, Arterial: 361 mmHg — ABNORMAL HIGH (ref 83–108)

## 2024-07-05 LAB — CBC
HCT: 37.6 % — ABNORMAL LOW (ref 39.0–52.0)
HCT: 37.2 % — ABNORMAL LOW (ref 39.0–52.0)
HCT: 40.4 % (ref 39.0–52.0)
HCT: 42.5 % (ref 39.0–52.0)
Hemoglobin: 12.6 g/dL — ABNORMAL LOW (ref 13.0–17.0)
Hemoglobin: 12.5 g/dL — ABNORMAL LOW (ref 13.0–17.0)
Hemoglobin: 13.8 g/dL (ref 13.0–17.0)
Hemoglobin: 14.5 g/dL (ref 13.0–17.0)
MCH: 28.3 pg (ref 26.0–34.0)
MCH: 28.4 pg (ref 26.0–34.0)
MCH: 28.6 pg (ref 26.0–34.0)
MCH: 28.7 pg (ref 26.0–34.0)
MCHC: 33.5 g/dL (ref 30.0–36.0)
MCHC: 33.6 g/dL (ref 30.0–36.0)
MCHC: 34.2 g/dL (ref 30.0–36.0)
MCHC: 34.1 g/dL (ref 30.0–36.0)
MCV: 84.3 fL (ref 80.0–100.0)
MCV: 84.5 fL (ref 80.0–100.0)
MCV: 83.6 fL (ref 80.0–100.0)
MCV: 84.2 fL (ref 80.0–100.0)
Platelets: 224 K/uL (ref 150–400)
Platelets: 246 K/uL (ref 150–400)
Platelets: 258 K/uL (ref 150–400)
Platelets: 272 K/uL (ref 150–400)
RBC: 4.46 MIL/uL (ref 4.22–5.81)
RBC: 4.4 MIL/uL (ref 4.22–5.81)
RBC: 4.83 MIL/uL (ref 4.22–5.81)
RBC: 5.05 MIL/uL (ref 4.22–5.81)
RDW: 13 % (ref 11.5–15.5)
RDW: 12.9 % (ref 11.5–15.5)
RDW: 12.8 % (ref 11.5–15.5)
RDW: 12.7 % (ref 11.5–15.5)
WBC: 9.3 K/uL (ref 4.0–10.5)
WBC: 9.8 K/uL (ref 4.0–10.5)
WBC: 10.1 K/uL (ref 4.0–10.5)
WBC: 10.2 K/uL (ref 4.0–10.5)
nRBC: 0 % (ref 0.0–0.2)
nRBC: 0 % (ref 0.0–0.2)
nRBC: 0 % (ref 0.0–0.2)
nRBC: 0 % (ref 0.0–0.2)

## 2024-07-05 LAB — COMPREHENSIVE METABOLIC PANEL WITH GFR
ALT: 198 U/L — ABNORMAL HIGH (ref 0–44)
ALT: 177 U/L — ABNORMAL HIGH (ref 0–44)
ALT: 183 U/L — ABNORMAL HIGH (ref 0–44)
ALT: 179 U/L — ABNORMAL HIGH (ref 0–44)
AST: 67 U/L — ABNORMAL HIGH (ref 15–41)
AST: 61 U/L — ABNORMAL HIGH (ref 15–41)
AST: 65 U/L — ABNORMAL HIGH (ref 15–41)
AST: 67 U/L — ABNORMAL HIGH (ref 15–41)
Albumin: 2.5 g/dL — ABNORMAL LOW (ref 3.5–5.0)
Albumin: 2.4 g/dL — ABNORMAL LOW (ref 3.5–5.0)
Albumin: 2.7 g/dL — ABNORMAL LOW (ref 3.5–5.0)
Albumin: 2.9 g/dL — ABNORMAL LOW (ref 3.5–5.0)
Alkaline Phosphatase: 67 U/L (ref 38–126)
Alkaline Phosphatase: 68 U/L (ref 38–126)
Alkaline Phosphatase: 75 U/L (ref 38–126)
Alkaline Phosphatase: 77 U/L (ref 38–126)
Anion gap: 10 (ref 5–15)
Anion gap: 11 (ref 5–15)
Anion gap: 13 (ref 5–15)
Anion gap: 12 (ref 5–15)
BUN: 14 mg/dL (ref 6–20)
BUN: 14 mg/dL (ref 6–20)
BUN: 14 mg/dL (ref 6–20)
BUN: 14 mg/dL (ref 6–20)
CO2: 26 mmol/L (ref 22–32)
CO2: 26 mmol/L (ref 22–32)
CO2: 27 mmol/L (ref 22–32)
CO2: 27 mmol/L (ref 22–32)
Calcium: 8.2 mg/dL — ABNORMAL LOW (ref 8.9–10.3)
Calcium: 8.3 mg/dL — ABNORMAL LOW (ref 8.9–10.3)
Calcium: 8.6 mg/dL — ABNORMAL LOW (ref 8.9–10.3)
Calcium: 8.9 mg/dL (ref 8.9–10.3)
Chloride: 103 mmol/L (ref 98–111)
Chloride: 103 mmol/L (ref 98–111)
Chloride: 99 mmol/L (ref 98–111)
Chloride: 100 mmol/L (ref 98–111)
Creatinine, Ser: 0.85 mg/dL (ref 0.61–1.24)
Creatinine, Ser: 0.83 mg/dL (ref 0.61–1.24)
Creatinine, Ser: 0.83 mg/dL (ref 0.61–1.24)
Creatinine, Ser: 0.86 mg/dL (ref 0.61–1.24)
GFR, Estimated: >60 mL/min (ref >60)
GFR, Estimated: >60 mL/min (ref >60)
GFR, Estimated: >60 mL/min (ref >60)
GFR, Estimated: >60 mL/min (ref >60)
Glucose, Bld: 95 mg/dL (ref 70–99)
Glucose, Bld: 93 mg/dL (ref 70–99)
Glucose, Bld: 106 mg/dL — ABNORMAL HIGH (ref 70–99)
Glucose, Bld: 109 mg/dL — ABNORMAL HIGH (ref 70–99)
Potassium: 3.7 mmol/L (ref 3.5–5.1)
Potassium: 3.8 mmol/L (ref 3.5–5.1)
Potassium: 3.7 mmol/L (ref 3.5–5.1)
Potassium: 3.9 mmol/L (ref 3.5–5.1)
Sodium: 139 mmol/L (ref 135–145)
Sodium: 140 mmol/L (ref 135–145)
Sodium: 139 mmol/L (ref 135–145)
Sodium: 139 mmol/L (ref 135–145)
Total Bilirubin: 0.8 mg/dL (ref 0.0–1.2)
Total Bilirubin: 0.8 mg/dL (ref 0.0–1.2)
Total Bilirubin: 1 mg/dL (ref 0.0–1.2)
Total Bilirubin: 1 mg/dL (ref 0.0–1.2)
Total Protein: 5.6 g/dL — ABNORMAL LOW (ref 6.5–8.1)
Total Protein: 5.5 g/dL — ABNORMAL LOW (ref 6.5–8.1)
Total Protein: 6.3 g/dL — ABNORMAL LOW (ref 6.5–8.1)
Total Protein: 6.7 g/dL (ref 6.5–8.1)

## 2024-07-05 LAB — AMYLASE
Amylase: 56 U/L (ref 28–100)
Amylase: 62 U/L (ref 28–100)
Amylase: 61 U/L (ref 28–100)
Amylase: 66 U/L (ref 28–100)

## 2024-07-05 LAB — URINALYSIS, ROUTINE W REFLEX MICROSCOPIC
Bilirubin Urine: NEGATIVE
Glucose, UA: NEGATIVE mg/dL
Hgb urine dipstick: NEGATIVE
Ketones, ur: NEGATIVE mg/dL
Leukocytes,Ua: NEGATIVE
Nitrite: NEGATIVE
Protein, ur: NEGATIVE mg/dL
Specific Gravity, Urine: 1.017 (ref 1.005–1.030)
pH: 8 (ref 5.0–8.0)

## 2024-07-05 LAB — BASIC METABOLIC PANEL WITH GFR
Anion gap: 9 (ref 5–15)
BUN: 13 mg/dL (ref 6–20)
CO2: 26 mmol/L (ref 22–32)
Calcium: 8.1 mg/dL — ABNORMAL LOW (ref 8.9–10.3)
Chloride: 101 mmol/L (ref 98–111)
Creatinine, Ser: 0.88 mg/dL (ref 0.61–1.24)
GFR, Estimated: >60 mL/min (ref >60)
Glucose, Bld: 98 mg/dL (ref 70–99)
Potassium: 3.8 mmol/L (ref 3.5–5.1)
Sodium: 136 mmol/L (ref 135–145)

## 2024-07-05 LAB — FIBRINOGEN
Fibrinogen: 737 mg/dL — ABNORMAL HIGH (ref 210–475)
Fibrinogen: 758 mg/dL — ABNORMAL HIGH (ref 210–475)
Fibrinogen: >800 mg/dL — ABNORMAL HIGH (ref 210–475)
Fibrinogen: >800 mg/dL — ABNORMAL HIGH (ref 210–475)

## 2024-07-05 LAB — MAGNESIUM
Magnesium: 1.9 mg/dL (ref 1.7–2.4)
Magnesium: 1.8 mg/dL (ref 1.7–2.4)
Magnesium: 1.8 mg/dL (ref 1.7–2.4)
Magnesium: 1.8 mg/dL (ref 1.7–2.4)

## 2024-07-05 LAB — APTT
aPTT: 31 s (ref 24–36)
aPTT: 30 s (ref 24–36)
aPTT: 30 s (ref 24–36)
aPTT: 28 s (ref 24–36)

## 2024-07-05 LAB — URINE CULTURE: Culture: NO GROWTH

## 2024-07-05 LAB — LIPASE, BLOOD
Lipase: 75 U/L — ABNORMAL HIGH (ref 11–51)
Lipase: 83 U/L — ABNORMAL HIGH (ref 11–51)
Lipase: 78 U/L — ABNORMAL HIGH (ref 11–51)
Lipase: 82 U/L — ABNORMAL HIGH (ref 11–51)

## 2024-07-05 LAB — PHOSPHORUS
Phosphorus: 5.4 mg/dL — ABNORMAL HIGH (ref 2.5–4.6)
Phosphorus: 4.9 mg/dL — ABNORMAL HIGH (ref 2.5–4.6)
Phosphorus: 4.9 mg/dL — ABNORMAL HIGH (ref 2.5–4.6)
Phosphorus: 4.4 mg/dL (ref 2.5–4.6)

## 2024-07-05 LAB — PROTIME-INR
INR: 1.1 (ref 0.8–1.2)
INR: 1.1 (ref 0.8–1.2)
INR: 1.1 (ref 0.8–1.2)
INR: 1.1 (ref 0.8–1.2)
Prothrombin Time: 14.8 s (ref 11.4–15.2)
Prothrombin Time: 14.5 s (ref 11.4–15.2)
Prothrombin Time: 15 s (ref 11.4–15.2)
Prothrombin Time: 14.8 s (ref 11.4–15.2)

## 2024-07-05 LAB — CK
Total CK: 238 U/L (ref 49–397)
Total CK: 226 U/L (ref 49–397)

## 2024-07-05 LAB — BILIRUBIN, DIRECT
Bilirubin, Direct: 0.2 mg/dL (ref 0.0–0.2)
Bilirubin, Direct: 0.2 mg/dL (ref 0.0–0.2)
Bilirubin, Direct: 0.3 mg/dL — ABNORMAL HIGH (ref 0.0–0.2)
Bilirubin, Direct: 0.3 mg/dL — ABNORMAL HIGH (ref 0.0–0.2)

## 2024-07-05 LAB — GLUCOSE, CAPILLARY
Glucose-Capillary: 103 mg/dL — ABNORMAL HIGH (ref 70–99)
Glucose-Capillary: 108 mg/dL — ABNORMAL HIGH (ref 70–99)
Glucose-Capillary: 110 mg/dL — ABNORMAL HIGH (ref 70–99)
Glucose-Capillary: 89 mg/dL (ref 70–99)
Glucose-Capillary: 96 mg/dL (ref 70–99)
Glucose-Capillary: 98 mg/dL (ref 70–99)
Glucose-Capillary: 99 mg/dL (ref 70–99)

## 2024-07-05 MED ORDER — OXYCODONE HCL 5 MG PO TABS
10.0000 mg | ORAL_TABLET | ORAL | Status: DC
  Administered 2024-07-05 – 2024-07-06 (×8): 10 mg
  Filled 2024-07-05 (×8): qty 2

## 2024-07-05 MED ORDER — MIDAZOLAM-SODIUM CHLORIDE 100-0.9 MG/100ML-% IV SOLN
0.5000 mg/h | INTRAVENOUS | Status: DC
  Administered 2024-07-05: 0.5 mg/h via INTRAVENOUS
  Administered 2024-07-06: 4 mg/h via INTRAVENOUS
  Filled 2024-07-05 (×2): qty 100

## 2024-07-05 MED ORDER — POTASSIUM CHLORIDE 20 MEQ PO PACK
60.0000 meq | PACK | Freq: Once | ORAL | Status: AC
  Administered 2024-07-05: 60 meq
  Filled 2024-07-05: qty 3

## 2024-07-05 MED ORDER — CLONAZEPAM 1 MG PO TABS
2.0000 mg | ORAL_TABLET | Freq: Four times a day (QID) | ORAL | Status: DC
  Administered 2024-07-05 – 2024-07-06 (×6): 2 mg
  Filled 2024-07-05 (×6): qty 2

## 2024-07-05 MED ORDER — FUROSEMIDE 10 MG/ML IJ SOLN
60.0000 mg | Freq: Three times a day (TID) | INTRAMUSCULAR | Status: AC
Start: 2024-07-05 — End: 2024-07-05
  Administered 2024-07-05 (×2): 60 mg via INTRAVENOUS
  Filled 2024-07-05 (×2): qty 6

## 2024-07-05 NOTE — Progress Notes (Signed)
 Communication error with ISTAT machine to where ABG results would not cross over to computer.  Results as follows:  Ph: 7.48 pCo2: 42.7 PaO2: 344 HCO3: 31.8  Results obtained after patient on 100% FIO2 and recruitment performed.

## 2024-07-05 NOTE — Progress Notes (Signed)
 Communication error occurred with ISTAT in which results for ABG will not cross over to computer.  Results are as follows:  pH: 7.47 pCO2: 37.6 PaO2:386 HCO3: 27.5 sO2%: 100  Sample obtained after patient had been on FIo2 for and recruitment performed.

## 2024-07-05 NOTE — Progress Notes (Signed)
 NAME:  Mike Sellers, MRN:  [id number], DOB:  04/29/1991, LOS: 6 ADMISSION DATE:  06/29/2024 CONSULTATION DATE:  06/29/2024 REFERRING MD:  Tegeler - EDP, CHIEF COMPLAINT:  Post-cardiac arrest, found down   History of Present Illness:  Unknown age man who presented to [facility] ED via EMS 8/14 after OOH cardiac arrest with unknown downtime. PMHx unknown.  Patient was reportedly found down outside of the [employer] with drug paraphernalia on site (crack pipe, lighters) with wet clothing/personal belongings. Per police, a Lexicographer reported seeing patient down around his arrival to work at 0400; at that time patient was breathing. On EMS arrival, patient was pulseless and apneic. Narcan was given without significant response. Initial rhythm was asystole and patient received CPR x 13 minutes, Epi x 2 with ROSC. Arrived to ED on Epi gtt. LMA placed. Intubated on arrival, no reported signs of responsiveness en route. On ED arrival, patient was afebrile, HR 80s, RR 25, BP 113/55 on Epi gtt, SpO2 100%. GCS 3.   Labs were notable for WBC 25.8, Hgb 14.7, Plt 231. INR 1.4. Na 145, K 3.9, CO2 16, BUN/Cr 11/2.57. Phos > 30. Mg 3.0. AST/ALT 872/1256, Alk Phos 99, Tbili 0.7. Trop 110. UA gluc 50, mod Hgb, prot 100. UDS +amphetamines. Initial CXR unremarkable. CT Head NAICA, CT C-Spine negative for acute traumatic injury.  PCCM consulted for ICU admission.  Pertinent Medical History:  History reviewed. No pertinent past medical history.  Significant Hospital Events: Including procedures, antibiotic start and stop dates in addition to other pertinent events   8/14 - Presented to [facility] ED as OOH cardiac arrest, [name] with unknown downtime. Initial rhythm asystole, CPR x 13 min, Epi x 2, Epi gtt. Intubated on ED arrival and NE started. CXR unremarkable. Labs/imaging pending. 8/15 neuro exam poor, myclonus overnight, on 1mcg norepi 8/18 No improvement in neuro exam, police able to ID and mom coming in 8/19  Family has requested organ donation 8/20 discussions with [facility], continued severe myoclonus with sedation wean   Interim History / Subjective:  Pt tolerated PSV 10/5 for about 5hrs overnight Severe myoclonus with any stimulation on propofol  40 and fentanyl   Objective:  Blood pressure 114/61, pulse 72, temperature 99.3 F (37.4 C), resp. rate (!) 23, height 5' 8 (1.727 m), weight 108.7 kg, SpO2 98%.    Vent Mode: PRVC FiO2 (%):  [40 %-100 %] 100 % Set Rate:  [18 bmp] 18 bmp Vt Set:  [500 mL] 500 mL PEEP:  [5 cmH20-8 cmH20] 5 cmH20 Pressure Support:  [10 cmH20-15 cmH20] 10 cmH20 Plateau Pressure:  [16 cmH20-18 cmH20] 16 cmH20   Intake/Output Summary (Last 24 hours) at 07/05/2024 9081 Last data filed at 07/05/2024 0700 Gross per 24 hour  Intake 2099.27 ml  Output 7787 ml  Net -5687.73 ml   Filed Weights   07/03/24 0500 07/04/24 0350 07/05/24 0500  Weight: 116.3 kg 111.9 kg 108.7 kg   General:  well nourished M, critically ill intubated and sedated  HEENT: MM pink/moist, sclera anicteric, PERRLA Neuro:, + gag and triggering vent,not following commands and does not withdraw to pain + corneals, intermittent myoclonus and shivering  CV: s1s2 rrr, no m/r/g PULM:  clear bilaterally and synchronous with vent  GI: soft, non-distended  Extremities: warm/dry, upper extremity edema      Resolved Hospital Problem List:  AKI Undifferentiated shock, cardiogenic versus septic component  Assessment & Plan:    Post-cardiac arrest, out of hospital with unknown downtime Hypoglycemia Ischemic brain  injury Aspiration pneumonia  Suspect hypoxic arrest in the setting of drug use -MRI and exam consistent with severe hypoxic injury, given severe myoclonus transition propofol  to versed  gtt and po oxy and clonazepam   - continue  supportive care with normothermia protocol - Goal MAP > 65, off pressors  - Fluid resuscitation as tolerated - Echo with EF 55-60%, RV systolic function mild  to moderately reduced with mild RV enlargement, small pericardial effusion  -continue Unasyn  -continue D5, LR, has had some vomiting with TF -family confirms that he would not want a trach and peg and prolonged life support, in discussions with [facility]   Acute hypoxemic respiratory failure in the setting of cardiac arrest Concern for aspiration PNA - Continue full vent support (4-8cc/kg IBW) - Wean FiO2 for O2 sat > 90% - Daily WUA/SBT once appropriate, not clinically appropriate at this time due to poor mental status and concern for sx - VAP bundle - Pulmonary hygiene - PAD protocol for sedation: Propofol  and Fentanyl  for goal RASS 0 to -1; off sedation at present and remains unresponsive - Follow CXR, ABG - Unasyn  for aspiration coverage   Hypomagnesemia Hypophosphatemia  - Trend BMP - Replete electrolytes as indicated - Monitor I&Os - Avoid nephrotoxic agents as able - Ensure adequate renal perfusion  Shock liver - LFT's improving   Substance abuse Received Narcan with EMS with no response. - UDS +amphetamines   Best Practice: (right click and Reselect all SmartList Selections daily)   Diet/type: NPO DVT prophylaxis: SCDs, SQH GI prophylaxis: PPI Lines: Central line and Arterial Line Foley:  Yes, and it is still needed Code Status:  full code Last date of multidisciplinary goals of care discussion [see IPAL]  Labs:  CBC: Recent Labs  Lab 07/04/24 0457 07/04/24 0821 07/04/24 1114 07/04/24 1210 07/04/24 1638 07/04/24 1724 07/04/24 2249 07/04/24 2328 07/05/24 0501 07/05/24 0847  WBC 8.4  --  8.6  --  8.9  --  11.0*  --  9.3  --   HGB 12.8*   < > 12.2*   < > 13.2 12.6* 14.1 12.9* 12.6* 11.2*  HCT 37.5*   < > 36.0*   < > 39.5 37.0* 41.8 38.0* 37.6* 33.0*  MCV 84.7  --  84.7  --  85.3  --  84.1  --  84.3  --   PLT 206  --  206  --  242  --  249  --  224  --    < > = values in this interval not displayed.   Basic Metabolic Panel: Recent Labs  Lab  07/04/24 0631 07/04/24 0821 07/04/24 1114 07/04/24 1210 07/04/24 1638 07/04/24 1724 07/04/24 2249 07/04/24 2328 07/05/24 0257 07/05/24 0501 07/05/24 0847  NA  --    < > 138   < > 136   < > 138 138 136 139 138  K  --    < > 3.6   < > 3.3*   < > 3.8 3.7 3.8 3.7 3.7  CL  --   --  107  --  101  --  100  --  101 103  --   CO2  --   --  23  --  25  --  25  --  26 26  --   GLUCOSE  --   --  94  --  94  --  100*  --  98 95  --   BUN  --   --  15  --  15  --  14  --  13 14  --   CREATININE  --   --  0.79  --  0.85  --  0.92  --  0.88 0.85  --   CALCIUM   --   --  7.8*  --  8.2*  --  8.8*  --  8.1* 8.2*  --   MG 1.7  --  1.9  --  1.8  --  2.0  --   --  1.9  --   PHOS 3.3  --  3.3  --  4.3  --  5.0*  --   --  5.4*  --    < > = values in this interval not displayed.   GFR: Estimated Creatinine Clearance: 149.1 mL/min (by C-G formula based on SCr of 0.85 mg/dL). Recent Labs  Lab 06/29/24 1341 06/29/24 1558 06/30/24 0352 07/04/24 0005 07/04/24 0457 07/04/24 0458 07/04/24 1114 07/04/24 1638 07/04/24 2249 07/05/24 0501  WBC  --   --    < > 9.7   < >  --  8.6 8.9 11.0* 9.3  LATICACIDVEN 3.7* 2.9*  --  1.0  --  0.7  --   --   --   --    < > = values in this interval not displayed.   Liver Function Tests: Recent Labs  Lab 07/04/24 0457 07/04/24 1114 07/04/24 1638 07/04/24 2249 07/05/24 0501  AST 96* 83* 87* 82* 67*  ALT 286* 244* 253* 249* 198*  ALKPHOS 59 57 73 72 67  BILITOT 0.5 0.6 0.8 0.4 0.8  PROT 5.1* 5.0* 5.8* 6.2* 5.6*  ALBUMIN  2.3* 2.3* 2.6* 2.8* 2.5*   Recent Labs  Lab 07/04/24 1142 07/04/24 1638 07/04/24 2249 07/05/24 0501  LIPASE 74* 84* 81* 75*  AMYLASE 54 66 66 56   Recent Labs  Lab 07/01/24 0348  AMMONIA 36*    ABG:    Component Value Date/Time   PHART 7.486 (H) 07/05/2024 0847   PCO2ART 37.7 07/05/2024 0847   PO2ART 416 (H) 07/05/2024 0847   HCO3 28.4 (H) 07/05/2024 0847   TCO2 30 07/05/2024 0847   ACIDBASEDEF 1.0 06/30/2024 0043   O2SAT  100 07/05/2024 0847    Coagulation Profile: Recent Labs  Lab 07/04/24 0457 07/04/24 1114 07/04/24 1638 07/04/24 2249 07/05/24 0501  INR 1.1 1.1 1.1 1.1 1.1   Cardiac Enzymes: Recent Labs  Lab 06/30/24 1208 07/04/24 0631 07/04/24 1638 07/04/24 2021 07/05/24 0257  CKTOTAL 4,495* 422* 324 287 238  CKMB  --  2.2  --   --   --     HbA1C: Hgb A1c MFr Bld  Date/Time Value Ref Range Status  06/30/2024 03:52 AM 5.6 4.8 - 5.6 % Final    Comment:    (NOTE) Diagnosis of Diabetes The following HbA1c ranges recommended by the American Diabetes Association (ADA) may be used as an aid in the diagnosis of diabetes mellitus.  Hemoglobin             Suggested A1C NGSP%              Diagnosis  <5.7                   Non Diabetic  5.7-6.4                Pre-Diabetic  >6.4                   Diabetic  <7.0  Glycemic control for                       adults with diabetes.      CBG: Recent Labs  Lab 07/04/24 2008 07/04/24 2316 07/05/24 0254 07/05/24 0457 07/05/24 0812  GLUCAP 97 95 98 96 89   Review of Systems:   Patient is encephalopathic and/or intubated; therefore, history has been obtained from chart review.   Past Medical History:  He,  has no past medical history on file.   Surgical History:  History reviewed. No pertinent surgical history.   Social History:   reports current drug use.   Family History:  His family history is not on file.   Allergies: Not on File   Home Medications: Prior to Admission medications   Not on File    Critical care time: 30 mins    CRITICAL CARE Performed by: [name] Doyel Mulkern   Total critical care time: 30 minutes  Critical care time was exclusive of separately billable procedures and treating other patients.  Critical care was necessary to treat or prevent imminent or life-threatening deterioration.  Critical care was time spent personally by me on the following activities: development of  treatment plan with patient and/or surrogate as well as nursing, discussions with consultants, evaluation of patient's response to treatment, examination of patient, obtaining history from patient or surrogate, ordering and performing treatments and interventions, ordering and review of laboratory studies, ordering and review of radiographic studies, pulse oximetry and re-evaluation of patient's condition.   [name] Dineen Conradt, PA-C [facility] See Amion for pager If no response to pager , please call [phone] until 7pm After 7:00 pm call Elink  [phone]

## 2024-07-05 NOTE — Plan of Care (Signed)
  Problem: Coping: Goal: Ability to adjust to condition or change in health will improve Outcome: Not Applicable   Problem: Fluid Volume: Goal: Ability to maintain a balanced intake and output will improve Outcome: Progressing   Problem: Metabolic: Goal: Ability to maintain appropriate glucose levels will improve Outcome: Progressing   Problem: Nutritional: Goal: Maintenance of adequate nutrition will improve Outcome: Not Applicable Goal: Progress toward achieving an optimal weight will improve Outcome: Not Applicable   Problem: Skin Integrity: Goal: Risk for impaired skin integrity will decrease Outcome: Progressing

## 2024-07-05 NOTE — Progress Notes (Signed)
 ISTAT communication error, results not transported to chart. ABG results as follows:  pH: 7.44 pCO2: 38.8 PaO2: 344 HCO3: 26.5 SaO2: 98%  ABG collected via arterial line @ 2000, post O2 challenge (100% FiO2/30 min).

## 2024-07-05 NOTE — Progress Notes (Signed)
 Communication error with the I-stat machine where the ABG would not cross over into EPIC. Vent settings Pressure Support of 10cm and PEEP of 5cm and 100% fio2. pH=7.50 PCO2=36.2 PO2=435 HCO3=28.3 BE=5 TCO2=29 S02=100 [facility] is aware of ABG results

## 2024-07-05 NOTE — Progress Notes (Signed)
 I updated Mr. Rekowski mother at bedside. She knows how ill he is and that he would not want to live dependent on machines long-term, but she admits she is struggling with the idea of hoping for a miracle. She has multiple times referenced knowing what is happening on Thursday (the tentative date of organ procurement if DCD), but feeling the strong loss as a mother.   We discussed the med changes (midazolam  substituted for propofol , added fentanyl ) made to help maintain comfort and control his myoclonus (triggered by routine parts of exam this morning).  Leita SHAUNNA Gaskins, DO 07/05/24 2:09 PM [facility]  For contact information, see Amion. If no response to pager, please call PCCM consult pager. After hours, 7PM- 7AM, please call Elink.

## 2024-07-05 NOTE — Progress Notes (Signed)
 RT NOTE: patient placed back on full support ventilation per [facility].  Tolerating well at this time.  Will continue to monitor.

## 2024-07-06 ENCOUNTER — Other Ambulatory Visit: Payer: Self-pay

## 2024-07-06 ENCOUNTER — Encounter (HOSPITAL_COMMUNITY): Payer: Self-pay | Admitting: Internal Medicine

## 2024-07-06 ENCOUNTER — Inpatient Hospital Stay (HOSPITAL_COMMUNITY)

## 2024-07-06 ENCOUNTER — Inpatient Hospital Stay (HOSPITAL_COMMUNITY): Payer: Self-pay | Admitting: Certified Registered"

## 2024-07-06 ENCOUNTER — Encounter (HOSPITAL_COMMUNITY): Admission: EM | Disposition: E | Payer: Self-pay | Source: Home / Self Care | Attending: Critical Care Medicine

## 2024-07-06 DIAGNOSIS — I469 Cardiac arrest, cause unspecified: Secondary | ICD-10-CM | POA: Diagnosis not present

## 2024-07-06 DIAGNOSIS — R7401 Elevation of levels of liver transaminase levels: Secondary | ICD-10-CM

## 2024-07-06 DIAGNOSIS — J690 Pneumonitis due to inhalation of food and vomit: Secondary | ICD-10-CM

## 2024-07-06 DIAGNOSIS — G9382 Brain death: Secondary | ICD-10-CM | POA: Diagnosis not present

## 2024-07-06 DIAGNOSIS — Z539 Procedure and treatment not carried out, unspecified reason: Secondary | ICD-10-CM

## 2024-07-06 DIAGNOSIS — G931 Anoxic brain damage, not elsewhere classified: Secondary | ICD-10-CM | POA: Diagnosis not present

## 2024-07-06 DIAGNOSIS — I6782 Cerebral ischemia: Secondary | ICD-10-CM | POA: Diagnosis not present

## 2024-07-06 DIAGNOSIS — J9601 Acute respiratory failure with hypoxia: Secondary | ICD-10-CM | POA: Diagnosis not present

## 2024-07-06 LAB — POCT I-STAT 7, (LYTES, BLD GAS, ICA,H+H)
Acid-Base Excess: 4 mmol/L — ABNORMAL HIGH (ref 0.0–2.0)
Acid-Base Excess: 4 mmol/L — ABNORMAL HIGH (ref 0.0–2.0)
Acid-Base Excess: 4 mmol/L — ABNORMAL HIGH (ref 0.0–2.0)
Acid-Base Excess: 4 mmol/L — ABNORMAL HIGH (ref 0.0–2.0)
Acid-Base Excess: 5 mmol/L — ABNORMAL HIGH (ref 0.0–2.0)
Bicarbonate: 28.9 mmol/L — ABNORMAL HIGH (ref 20.0–28.0)
Bicarbonate: 28.8 mmol/L — ABNORMAL HIGH (ref 20.0–28.0)
Bicarbonate: 28.9 mmol/L — ABNORMAL HIGH (ref 20.0–28.0)
Bicarbonate: 27.8 mmol/L (ref 20.0–28.0)
Bicarbonate: 29.6 mmol/L — ABNORMAL HIGH (ref 20.0–28.0)
Calcium, Ion: 1.21 mmol/L (ref 1.15–1.40)
Calcium, Ion: 1.23 mmol/L (ref 1.15–1.40)
Calcium, Ion: 1.24 mmol/L (ref 1.15–1.40)
Calcium, Ion: 1.19 mmol/L (ref 1.15–1.40)
Calcium, Ion: 1.15 mmol/L (ref 1.15–1.40)
HCT: 39 % (ref 39.0–52.0)
HCT: 38 % — ABNORMAL LOW (ref 39.0–52.0)
HCT: 38 % — ABNORMAL LOW (ref 39.0–52.0)
HCT: 36 % — ABNORMAL LOW (ref 39.0–52.0)
HCT: 36 % — ABNORMAL LOW (ref 39.0–52.0)
Hemoglobin: 13.3 g/dL (ref 13.0–17.0)
Hemoglobin: 12.9 g/dL — ABNORMAL LOW (ref 13.0–17.0)
Hemoglobin: 12.9 g/dL — ABNORMAL LOW (ref 13.0–17.0)
Hemoglobin: 12.2 g/dL — ABNORMAL LOW (ref 13.0–17.0)
Hemoglobin: 12.2 g/dL — ABNORMAL LOW (ref 13.0–17.0)
O2 Saturation: 100 %
O2 Saturation: 98 %
O2 Saturation: 99 %
O2 Saturation: 100 %
O2 Saturation: 100 %
Patient temperature: 37.4
Patient temperature: 37.6
Patient temperature: 98.9
Patient temperature: 37.5
Patient temperature: 37.5
Potassium: 4.2 mmol/L (ref 3.5–5.1)
Potassium: 4.2 mmol/L (ref 3.5–5.1)
Potassium: 4.4 mmol/L (ref 3.5–5.1)
Potassium: 4.2 mmol/L (ref 3.5–5.1)
Potassium: 4.1 mmol/L (ref 3.5–5.1)
Sodium: 139 mmol/L (ref 135–145)
Sodium: 137 mmol/L (ref 135–145)
Sodium: 139 mmol/L (ref 135–145)
Sodium: 137 mmol/L (ref 135–145)
Sodium: 136 mmol/L (ref 135–145)
TCO2: 30 mmol/L (ref 22–32)
TCO2: 30 mmol/L (ref 22–32)
TCO2: 30 mmol/L (ref 22–32)
TCO2: 29 mmol/L (ref 22–32)
TCO2: 31 mmol/L (ref 22–32)
pCO2 arterial: 43.2 mmHg (ref 32–48)
pCO2 arterial: 43.2 mmHg (ref 32–48)
pCO2 arterial: 45.2 mmHg (ref 32–48)
pCO2 arterial: 41 mmHg (ref 32–48)
pCO2 arterial: 44.2 mmHg (ref 32–48)
pH, Arterial: 7.435 (ref 7.35–7.45)
pH, Arterial: 7.417 (ref 7.35–7.45)
pH, Arterial: 7.432 (ref 7.35–7.45)
pH, Arterial: 7.442 (ref 7.35–7.45)
pH, Arterial: 7.436 (ref 7.35–7.45)
pO2, Arterial: 340 mmHg — ABNORMAL HIGH (ref 83–108)
pO2, Arterial: 107 mmHg (ref 83–108)
pO2, Arterial: 120 mmHg — ABNORMAL HIGH (ref 83–108)
pO2, Arterial: 363 mmHg — ABNORMAL HIGH (ref 83–108)
pO2, Arterial: 377 mmHg — ABNORMAL HIGH (ref 83–108)

## 2024-07-06 LAB — TYPE AND SCREEN
ABO/RH(D): O POS
Antibody Screen: NEGATIVE
Unit division: 0
Unit division: 0
Unit division: 0
Unit division: 0
Unit division: 0
Unit division: 0
Unit division: 0
Unit division: 0
Unit division: 0
Unit division: 0
Unit division: 0

## 2024-07-06 LAB — AMYLASE
Amylase: 65 U/L (ref 28–100)
Amylase: 70 U/L (ref 28–100)

## 2024-07-06 LAB — BPAM RBC
Blood Product Expiration Date: 202509162359
Blood Product Expiration Date: 202509172359
Blood Product Expiration Date: 202509172359
Blood Product Expiration Date: 202509172359
Blood Product Expiration Date: 202509172359
Blood Product Expiration Date: 202509182359
Blood Product Expiration Date: 202509182359
Blood Product Expiration Date: 202509182359
Blood Product Expiration Date: 202509182359
Blood Product Expiration Date: 202509192359
Blood Product Expiration Date: 202509192359
ISSUE DATE / TIME: 202508211633
ISSUE DATE / TIME: 202508211633
ISSUE DATE / TIME: 202508211633
ISSUE DATE / TIME: 202508211633
ISSUE DATE / TIME: 202508211633
ISSUE DATE / TIME: 202508211633
ISSUE DATE / TIME: 202508211633
ISSUE DATE / TIME: 202508211633
ISSUE DATE / TIME: 202508211633
ISSUE DATE / TIME: 202508211633
ISSUE DATE / TIME: 202508211633
Unit Type and Rh: 5100
Unit Type and Rh: 5100
Unit Type and Rh: 5100
Unit Type and Rh: 5100
Unit Type and Rh: 5100
Unit Type and Rh: 5100
Unit Type and Rh: 5100
Unit Type and Rh: 5100
Unit Type and Rh: 5100
Unit Type and Rh: 5100
Unit Type and Rh: 5100

## 2024-07-06 LAB — COMPREHENSIVE METABOLIC PANEL WITH GFR
ALT: 157 U/L — ABNORMAL HIGH (ref 0–44)
ALT: 131 U/L — ABNORMAL HIGH (ref 0–44)
AST: 60 U/L — ABNORMAL HIGH (ref 15–41)
AST: 56 U/L — ABNORMAL HIGH (ref 15–41)
Albumin: 2.7 g/dL — ABNORMAL LOW (ref 3.5–5.0)
Albumin: 2.6 g/dL — ABNORMAL LOW (ref 3.5–5.0)
Alkaline Phosphatase: 74 U/L (ref 38–126)
Alkaline Phosphatase: 69 U/L (ref 38–126)
Anion gap: 13 (ref 5–15)
Anion gap: 10 (ref 5–15)
BUN: 14 mg/dL (ref 6–20)
BUN: 17 mg/dL (ref 6–20)
CO2: 26 mmol/L (ref 22–32)
CO2: 26 mmol/L (ref 22–32)
Calcium: 8.8 mg/dL — ABNORMAL LOW (ref 8.9–10.3)
Calcium: 8.8 mg/dL — ABNORMAL LOW (ref 8.9–10.3)
Chloride: 100 mmol/L (ref 98–111)
Chloride: 101 mmol/L (ref 98–111)
Creatinine, Ser: 0.89 mg/dL (ref 0.61–1.24)
Creatinine, Ser: 0.96 mg/dL (ref 0.61–1.24)
GFR, Estimated: >60 mL/min (ref >60)
GFR, Estimated: >60 mL/min (ref >60)
Glucose, Bld: 107 mg/dL — ABNORMAL HIGH (ref 70–99)
Glucose, Bld: 99 mg/dL (ref 70–99)
Potassium: 4.2 mmol/L (ref 3.5–5.1)
Potassium: 4.3 mmol/L (ref 3.5–5.1)
Sodium: 139 mmol/L (ref 135–145)
Sodium: 137 mmol/L (ref 135–145)
Total Bilirubin: 1 mg/dL (ref 0.0–1.2)
Total Bilirubin: 1 mg/dL (ref 0.0–1.2)
Total Protein: 6.5 g/dL (ref 6.5–8.1)
Total Protein: 6.2 g/dL — ABNORMAL LOW (ref 6.5–8.1)

## 2024-07-06 LAB — CBC
HCT: 41.1 % (ref 39.0–52.0)
HCT: 40.3 % (ref 39.0–52.0)
Hemoglobin: 14 g/dL (ref 13.0–17.0)
Hemoglobin: 13.3 g/dL (ref 13.0–17.0)
MCH: 28.7 pg (ref 26.0–34.0)
MCH: 28.2 pg (ref 26.0–34.0)
MCHC: 34.1 g/dL (ref 30.0–36.0)
MCHC: 33 g/dL (ref 30.0–36.0)
MCV: 84.4 fL (ref 80.0–100.0)
MCV: 85.4 fL (ref 80.0–100.0)
Platelets: 256 K/uL (ref 150–400)
Platelets: 266 K/uL (ref 150–400)
RBC: 4.87 MIL/uL (ref 4.22–5.81)
RBC: 4.72 MIL/uL (ref 4.22–5.81)
RDW: 13 % (ref 11.5–15.5)
RDW: 12.9 % (ref 11.5–15.5)
WBC: 9.3 K/uL (ref 4.0–10.5)
WBC: 10.4 K/uL (ref 4.0–10.5)
nRBC: 0 % (ref 0.0–0.2)
nRBC: 0 % (ref 0.0–0.2)

## 2024-07-06 LAB — PHOSPHORUS
Phosphorus: 4.2 mg/dL (ref 2.5–4.6)
Phosphorus: 3.6 mg/dL (ref 2.5–4.6)

## 2024-07-06 LAB — GLUCOSE, CAPILLARY
Glucose-Capillary: 109 mg/dL — ABNORMAL HIGH (ref 70–99)
Glucose-Capillary: 105 mg/dL — ABNORMAL HIGH (ref 70–99)
Glucose-Capillary: 94 mg/dL (ref 70–99)
Glucose-Capillary: 102 mg/dL — ABNORMAL HIGH (ref 70–99)

## 2024-07-06 LAB — URINALYSIS, ROUTINE W REFLEX MICROSCOPIC
Bacteria, UA: NONE SEEN
Bilirubin Urine: NEGATIVE
Glucose, UA: NEGATIVE mg/dL
Hgb urine dipstick: NEGATIVE
Ketones, ur: NEGATIVE mg/dL
Leukocytes,Ua: NEGATIVE
Nitrite: NEGATIVE
Protein, ur: NEGATIVE mg/dL
Specific Gravity, Urine: 1.011 (ref 1.005–1.030)
pH: 7 (ref 5.0–8.0)

## 2024-07-06 LAB — CK
Total CK: 196 U/L (ref 49–397)
Total CK: 142 U/L (ref 49–397)
Total CK: 115 U/L (ref 49–397)

## 2024-07-06 LAB — LIPASE, BLOOD
Lipase: 79 U/L — ABNORMAL HIGH (ref 11–51)
Lipase: 90 U/L — ABNORMAL HIGH (ref 11–51)

## 2024-07-06 LAB — PREPARE RBC (CROSSMATCH)

## 2024-07-06 LAB — BILIRUBIN, DIRECT
Bilirubin, Direct: 0.3 mg/dL — ABNORMAL HIGH (ref 0.0–0.2)
Bilirubin, Direct: 0.3 mg/dL — ABNORMAL HIGH (ref 0.0–0.2)

## 2024-07-06 LAB — FIBRINOGEN
Fibrinogen: >800 mg/dL — ABNORMAL HIGH (ref 210–475)
Fibrinogen: >800 mg/dL — ABNORMAL HIGH (ref 210–475)

## 2024-07-06 LAB — MAGNESIUM
Magnesium: 2 mg/dL (ref 1.7–2.4)
Magnesium: 1.9 mg/dL (ref 1.7–2.4)

## 2024-07-06 LAB — PROTIME-INR
INR: 1.1 (ref 0.8–1.2)
INR: 1.1 (ref 0.8–1.2)
Prothrombin Time: 15.3 s — ABNORMAL HIGH (ref 11.4–15.2)
Prothrombin Time: 15 s (ref 11.4–15.2)

## 2024-07-06 LAB — APTT
aPTT: 31 s (ref 24–36)
aPTT: 35 s (ref 24–36)

## 2024-07-06 LAB — TRIGLYCERIDES: Triglycerides: 167 mg/dL — ABNORMAL HIGH (ref <150)

## 2024-07-06 SURGERY — CANCELLED PROCEDURE
Anesthesia: Monitor Anesthesia Care

## 2024-07-06 MED ORDER — ALBUMIN HUMAN 5 % IV SOLN
50.0000 g | Freq: Once | INTRAVENOUS | Status: DC
  Filled 2024-07-06: qty 1000

## 2024-07-06 MED ORDER — MIDAZOLAM BOLUS VIA INFUSION: 4.0000 mg | INTRAVENOUS | Status: DC | PRN

## 2024-07-06 MED ORDER — MIDAZOLAM HCL 2 MG/2ML IJ SOLN
1.0000 mg | INTRAMUSCULAR | Status: DC | PRN
  Filled 2024-07-06: qty 2

## 2024-07-06 MED ORDER — FENTANYL BOLUS VIA INFUSION: 50.0000 ug | INTRAVENOUS | Status: DC | PRN

## 2024-07-06 MED ORDER — DEXMEDETOMIDINE HCL IN NACL 80 MCG/20ML IV SOLN
INTRAVENOUS | Status: AC
  Filled 2024-07-06: qty 20

## 2024-07-06 MED ORDER — GLYCOPYRROLATE 1 MG PO TABS: 1.0000 mg | ORAL_TABLET | ORAL | Status: DC | PRN

## 2024-07-06 MED ORDER — MIDAZOLAM-SODIUM CHLORIDE 100-0.9 MG/100ML-% IV SOLN: 0.0000 mg/h | INTRAVENOUS | Status: DC

## 2024-07-06 MED ORDER — POLYVINYL ALCOHOL 1.4 % OP SOLN: 1.0000 [drp] | Freq: Four times a day (QID) | OPHTHALMIC | Status: DC | PRN

## 2024-07-06 MED ORDER — GLYCOPYRROLATE 0.2 MG/ML IJ SOLN: 0.2000 mg | INTRAMUSCULAR | Status: DC | PRN

## 2024-07-06 MED ORDER — MORPHINE 100MG IN NS 100ML (1MG/ML) PREMIX INFUSION: 0.0000 mg/h | INTRAVENOUS | Status: DC

## 2024-07-06 MED ORDER — SODIUM CHLORIDE 0.9% IV SOLUTION: Freq: Once | INTRAVENOUS | Status: DC

## 2024-07-06 MED ORDER — FENTANYL BOLUS VIA INFUSION: 100.0000 ug | INTRAVENOUS | Status: DC | PRN

## 2024-07-06 MED ORDER — MORPHINE BOLUS VIA INFUSION: 5.0000 mg | INTRAVENOUS | Status: DC | PRN

## 2024-07-06 MED ORDER — HEPARIN SODIUM (PORCINE) 10000 UNIT/ML IJ SOLN
30000.0000 [IU] | Freq: Once | INTRAMUSCULAR | Status: AC
  Administered 2024-07-06: 30000 [IU] via INTRAVENOUS
  Filled 2024-07-06 (×4): qty 3

## 2024-07-06 MED ORDER — DEXMEDETOMIDINE HCL IN NACL 400 MCG/100ML IV SOLN
1.0000 ug/kg/h | INTRAVENOUS | Status: DC
  Filled 2024-07-06: qty 100

## 2024-07-06 MED ORDER — ACETAMINOPHEN 650 MG RE SUPP: 650.0000 mg | Freq: Four times a day (QID) | RECTAL | Status: DC | PRN

## 2024-07-06 MED ORDER — ACETAMINOPHEN 160 MG/5ML PO SOLN: 650.0000 mg | Freq: Four times a day (QID) | ORAL | Status: DC | PRN

## 2024-07-06 MED ORDER — SODIUM CHLORIDE 0.9 % IV SOLN: INTRAVENOUS | Status: DC

## 2024-07-06 MED ORDER — HEPARIN SODIUM 10000 UNIT/ML FOR ORGAN DONATION
30000.0000 [IU] | INTRAMUSCULAR | Status: DC
  Filled 2024-07-06: qty 3

## 2024-07-06 MED ORDER — GLYCOPYRROLATE 0.2 MG/ML IJ SOLN
0.2000 mg | INTRAMUSCULAR | Status: DC | PRN
  Administered 2024-07-06: 0.2 mg via INTRAVENOUS
  Filled 2024-07-06: qty 1

## 2024-07-06 MED ORDER — ALBUMIN HUMAN 25 % IV SOLN
12.5000 g | Freq: Once | INTRAVENOUS | Status: DC
  Filled 2024-07-06: qty 50

## 2024-07-06 MED ORDER — MIDAZOLAM BOLUS VIA INFUSION (WITHDRAWAL LIFE SUSTAINING TX): 2.0000 mg | INTRAVENOUS | Status: DC | PRN

## 2024-07-06 MED ORDER — MIDAZOLAM BOLUS VIA INFUSION: 2.0000 mg | INTRAVENOUS | Status: DC | PRN

## 2024-07-06 MED ORDER — HEPARIN SODIUM 10000 UNIT/ML FOR ORGAN DONATION: 300.0000 [IU]/kg | Freq: Once | INTRAMUSCULAR | Status: DC

## 2024-07-06 MED ORDER — HEPARIN SODIUM 10000 UNIT/ML FOR ORGAN DONATION
10870.0000 [IU] | INTRAMUSCULAR | Status: DC
Start: 2024-07-06 — End: 2024-07-06
  Filled 2024-07-06: qty 2

## 2024-07-06 SURGICAL SUPPLY — 21 items
BLADE CLIPPER SURG (BLADE) IMPLANT
BLADE SAW STERNAL (BLADE) ×2 IMPLANT
CLIP APPLIE 11 MED OPEN (CLIP) ×1 IMPLANT
COVER MAYO STAND STRL (DRAPES) IMPLANT
COVER SURGICAL LIGHT HANDLE (MISCELLANEOUS) ×1 IMPLANT
CUTTER ECHEON FLEX ENDO 45 340 (ENDOMECHANICALS) IMPLANT
DRAPE SLUSH MACHINE 52X66 (DRAPES) ×1 IMPLANT
DURAPREP 26ML APPLICATOR (WOUND CARE) IMPLANT
ELECTRODE REM PT RTRN 9FT ADLT (ELECTROSURGICAL) ×2 IMPLANT
GOWN STRL REUS W/ TWL LRG LVL3 (GOWN DISPOSABLE) ×4 IMPLANT
GOWN STRL REUS W/ TWL XL LVL3 (GOWN DISPOSABLE) ×2 IMPLANT
HANDLE SUCTION POOLE (INSTRUMENTS) IMPLANT
KIT TURNOVER KIT B (KITS) ×2 IMPLANT
NS IRRIG 1000ML POUR BTL (IV SOLUTION) IMPLANT
PACK AORTA (CUSTOM PROCEDURE TRAY) ×1 IMPLANT
PENCIL BUTTON HOLSTER BLD 10FT (ELECTRODE) ×1 IMPLANT
STAPLER SKIN PROX 35W (STAPLE) ×1 IMPLANT
SUT BONE WAX W31G (SUTURE) IMPLANT
SUT SILK 1 TIES 10X30 (SUTURE) IMPLANT
TUBE CONNECTING 12X1/4 (SUCTIONS) ×1 IMPLANT
YANKAUER SUCT BULB TIP NO VENT (SUCTIONS) ×1 IMPLANT

## 2024-07-07 ENCOUNTER — Encounter (HOSPITAL_COMMUNITY): Payer: Self-pay | Admitting: Orthopedic Surgery

## 2024-07-07 LAB — CULTURE, RESPIRATORY W GRAM STAIN

## 2024-07-09 LAB — CULTURE, BLOOD (ROUTINE X 2)
Culture: NO GROWTH
Culture: NO GROWTH
Special Requests: ADEQUATE

## 2024-07-17 NOTE — IPAL (Signed)
  Interdisciplinary Goals of Care Family Meeting   Date carried out: 07-10-2024  Location of Mike meeting: Bedside  Member's involved: Nurse Practitioner, Bedside Registered Nurse, and Family Member or next of kin  Durable Power of Attorney or acting medical decision maker: mom / dad     Discussion: We discussed goals of care for Mike Sellers .  Talked w pts parents, condolences expressed for this difficult situation. We talked about plan for OR later this afternoon, and comfort care process in OR- including code status change to DNR  Code status:   Code Status: Do not attempt resuscitation (DNR) - Comfort care   Disposition: In-patient comfort care  Time spent for Mike meeting:    Mike Sellers Gave, NP  07/10/2024, 9:28 AM

## 2024-07-17 NOTE — Transfer of Care (Signed)
 Immediate Anesthesia Transfer of Care Note  Patient: Mike Sellers  Procedure(s) Performed: CANCELLED PROCEDURE  Patient Location: SICU  Anesthesia Type: Patient extubated for organ procurement.   Level of Consciousness:   Airway & Oxygen Therapy: No airway and room air; patient comfort care  Post-op Assessment: Bedside nurse with patient and will transport back to SICU  Post vital signs:   Last Vitals:  Vitals Value Taken Time  BP    Temp    Pulse    Resp 0 July 31, 2024 20:14  SpO2    Vitals shown include unfiled device data.  Last Pain:  Vitals:   07-31-24 1200  TempSrc: Esophageal         Complications: No notable events documented.

## 2024-07-17 NOTE — Death Summary Note (Signed)
 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 year old male who was an unknown age man who presented to [facility] ED via EMS 2024/07/16 after OOH cardiac arrest with unknown downtime. PMHx unknown.   Patient was reportedly found down outside of the [employer] with drug paraphernalia on site (crack pipe, lighters) with wet clothing/personal belongings. Per police, a Lexicographer reported seeing patient down around his arrival to work at 0400; at that time patient was breathing. On EMS arrival, patient was pulseless and apneic. Narcan was given without significant response. Initial rhythm was asystole and patient received CPR x 13 minutes, Epi x 2 with ROSC. Arrived to ED on Epi gtt. LMA placed. Intubated on arrival, no reported signs of responsiveness en route. On ED arrival, patient was afebrile, HR 80s, RR 25, BP 113/55 on Epi gtt, SpO2 100%. GCS 3.    Labs were notable for WBC 25.8, Hgb 14.7, Plt 231. INR 1.4. Na 145, K 3.9, CO2 16, BUN/Cr 11/2.57. Phos > 30. Mg 3.0. AST/ALT 872/1256, Alk Phos 99, Tbili  0.7. Trop 110. UA gluc 50, mod Hgb, prot 100. UDS +amphetamines. Initial CXR unremarkable. CT Head NAICA, CT C-Spine negative for acute traumatic injury.   PCCM consulted for ICU admission.  [employer] were able to identify him by fingerprints allowing us  to contact his family during his admission.  Unfortunately during his admission he had refractory myoclonus and encephalopathy, never regaining consciousness. His family reported unanimously that he would not want long-term life on machines, and they elected to pursue comfort care. His family was interested in honoring his wishes to be an organ donor, but unfortunately he was not ultimately able to be a donor. He passed away with family at bedside.   Pertinent Labs and Studies  Significant Diagnostic Studies DG CHEST PORT 1 VIEW Result Date: July 23, 2024 CLINICAL DATA:  [id number].  History of ETT. EXAM: PORTABLE CHEST 1 VIEW COMPARISON:  Portable chest yesterday at 10:02 a.m. FINDINGS: 520 in the a.m. ETT tip is 3.5 cm from the carina. NGT is partially coiled in the stomach to the left with the tip in the upper fundal area. There is a left IJ central line terminating at the superior cavoatrial junction. Cardiac size is normal. Mediastinum is normally outlined. There is opacity in the left lower lobe consistent with atelectasis, pneumonia or aspiration. Remaining lungs are clear. Overall aeration seems unchanged. No significant pleural effusion. IMPRESSION: 1. ETT tip 3.5 cm from the carina. 2. Left lower lobe opacity consistent with atelectasis, pneumonia or aspiration. 3. No significant change from yesterday's study. Electronically Signed   By: Francis Quam M.D.   On: 07-23-2024 07:52   DG CHEST  PORT 1 VIEW Result Date: 07/05/2024 CLINICAL DATA:  Endotracheal tube. EXAM: PORTABLE CHEST 1 VIEW COMPARISON:  07/04/2024. FINDINGS: Endotracheal tube tip is located 4.1 cm above the carina. Stable left IJ CVC catheter. Enteric tube courses below the left  hemidiaphragm, beyond the field of view. Similar mild left basilar opacity. The right lung appears clear. No sizable pleural effusion or pneumothorax. Visualized osseous structures are unchanged. IMPRESSION: 1. Stable support apparatus, as above. 2. Similar mild left basilar opacity. Electronically Signed   By: Harrietta Sherry M.D.   On: 07/05/2024 11:18   ECHOCARDIOGRAM COMPLETE Result Date: 07/04/2024    ECHOCARDIOGRAM REPORT   Patient Name:   Mike Sellers Date of Exam: 07/04/2024 Medical Rec #:  [id number]         Height:       68.0 in Accession #:    [id number]        Weight:       246.7 lb Date of Birth:  September 13, 1991         BSA:          2.234 m Patient Age:    33 years          BP:           144/74 mmHg Patient Gender: M                 HR:           74 bpm. Exam Location:  Inpatient Procedure: 2D Echo, Cardiac Doppler and Color Doppler (Both Spectral and Color            Flow Doppler were utilized during procedure). Indications:    Cardiac arrest  History:        Patient has prior history of Echocardiogram examinations, most                 recent 06/20/2024. Cardia arrest.  Sonographer:    Benard Stallion Referring Phys: [id number] ANTON M DELA CRUZ IMPRESSIONS  1. Left ventricular ejection fraction, by estimation, is 60 to 65%. The left ventricle has normal function. The left ventricle has no regional wall motion abnormalities. Left ventricular diastolic parameters were normal.  2. Right ventricular systolic function is normal. The right ventricular size is normal.  3. The mitral valve is normal in structure. No evidence of mitral valve regurgitation. No evidence of mitral stenosis.  4. The aortic valve is normal in structure. Aortic valve regurgitation is not visualized. No aortic stenosis is present.  5. The inferior vena cava is normal in size with greater than 50% respiratory variability, suggesting right atrial pressure of 3 mmHg. Comparison(s): No significant change from prior study. Prior images  reviewed side by side. FINDINGS  Left Ventricle: Left ventricular ejection fraction, by estimation, is 60 to 65%. The left ventricle has normal function. The left ventricle has no regional wall motion abnormalities. The left ventricular internal cavity size was normal in size. There is  no left ventricular hypertrophy. Left ventricular diastolic parameters were normal. Right Ventricle: The right ventricular size is normal. No increase in right ventricular wall thickness. Right ventricular systolic function is normal. Left Atrium: Left atrial size was normal in size. Right Atrium: Right atrial size was normal in size. Pericardium: There is no evidence of pericardial effusion. Mitral Valve: The mitral valve is normal in structure. No evidence of mitral valve regurgitation. No evidence of mitral valve stenosis. Tricuspid Valve: The tricuspid valve is normal in structure. Tricuspid valve regurgitation is not demonstrated.  No evidence of tricuspid stenosis. Aortic Valve: The aortic valve is normal in structure. Aortic valve regurgitation is not visualized. No aortic stenosis is present. Aortic valve mean gradient measures 4.0 mmHg. Aortic valve peak gradient measures 7.1 mmHg. Aortic valve area, by VTI measures 2.69 cm. Pulmonic Valve: The pulmonic valve was normal in structure. Pulmonic valve regurgitation is not visualized. No evidence of pulmonic stenosis. Aorta: The aortic root is normal in size and structure. Venous: The inferior vena cava is normal in size with greater than 50% respiratory variability, suggesting right atrial pressure of 3 mmHg. IAS/Shunts: No atrial level shunt detected by color flow Doppler.  LEFT VENTRICLE PLAX 2D LVIDd:         4.80 cm   Diastology LVIDs:         3.00 cm   LV e' medial:    12.40 cm/s LV PW:         1.10 cm   LV E/e' medial:  5.9 LV IVS:        1.10 cm   LV e' lateral:   14.10 cm/s LVOT diam:     2.00 cm   LV E/e' lateral: 5.2 LV SV:         65 LV SV Index:   29 LVOT Area:      3.14 cm  RIGHT VENTRICLE TAPSE (M-mode): 2.7 cm LEFT ATRIUM             Index        RIGHT ATRIUM           Index LA diam:        2.90 cm 1.30 cm/m   RA Area:     16.30 cm LA Vol (A2C):   40.7 ml 18.22 ml/m  RA Volume:   43.80 ml  19.60 ml/m LA Vol (A4C):   37.8 ml 16.92 ml/m LA Biplane Vol: 41.0 ml 18.35 ml/m  AORTIC VALVE AV Area (Vmax):    2.80 cm AV Area (Vmean):   2.64 cm AV Area (VTI):     2.69 cm AV Vmax:           133.50 cm/s AV Vmean:          90.250 cm/s AV VTI:            0.242 m AV Peak Grad:      7.1 mmHg AV Mean Grad:      4.0 mmHg LVOT Vmax:         119.00 cm/s LVOT Vmean:        75.900 cm/s LVOT VTI:          0.207 m LVOT/AV VTI ratio: 0.86  AORTA Ao Root diam: 2.80 cm MITRAL VALVE MV Area (PHT): 3.99 cm    SHUNTS MV Decel Time: 190 msec    Systemic VTI:  0.21 m MV E velocity: 73.70 cm/s  Systemic Diam: 2.00 cm MV A velocity: 59.50 cm/s MV E/A ratio:  1.24 Mihai Croitoru MD Electronically signed by Jerel Balding MD Signature Date/Time: 07/04/2024/2:35:19 PM    Final    DG CHEST PORT 1 VIEW Result Date: 07/04/2024 EXAM: 1 VIEW XRAY OF THE CHEST 07/04/2024 12:18:00 PM COMPARISON: AP radiograph of the chest dated 06/30/2024. CLINICAL HISTORY: Organ donor. FINDINGS: LUNGS AND PLEURA: Mild hazy opacification of the left lung base. No pulmonary edema. No pleural effusion. No pneumothorax. HEART AND MEDIASTINUM: No acute abnormality of the cardiac and mediastinal silhouettes. BONES AND SOFT TISSUES: No acute osseous abnormality. LINES AND  TUBES: An endotracheal tube, left internal jugular central venous line and a gastric catheter remain in satisfactory position. IMPRESSION: 1. Mild hazy opacification of the left lung base. Electronically signed by: Evalene Coho MD 07/04/2024 12:56 PM EDT RP Workstation: [id number]   CT CHEST ABDOMEN PELVIS WO CONTRAST Result Date: 07/03/2024 CLINICAL DATA:  Evaluation for organ donation EXAM: CT CHEST, ABDOMEN AND PELVIS WITHOUT CONTRAST TECHNIQUE:  Multidetector CT imaging of the chest, abdomen and pelvis was performed following the standard protocol without IV contrast. RADIATION DOSE REDUCTION: This exam was performed according to the departmental dose-optimization program which includes automated exposure control, adjustment of the mA and/or kV according to patient size and/or use of iterative reconstruction technique. COMPARISON:  None Available. FINDINGS: CT CHEST FINDINGS Cardiovascular: Heart is normal size. Aorta is normal caliber. Mediastinum/Nodes: Endotracheal tube tip in the midtrachea. No mediastinal, hilar, or axillary adenopathy. Trachea and esophagus are unremarkable. Thyroid unremarkable. Lungs/Pleura: Bilateral lower lobe airspace opacities are noted which could reflect pneumonia. Aspiration is possible. No effusions or pneumothorax. Musculoskeletal: Chest wall soft tissues are unremarkable. No acute bony abnormality. CT ABDOMEN PELVIS FINDINGS Hepatobiliary: No focal hepatic abnormality. Gallbladder unremarkable. Pancreas: No focal abnormality or ductal dilatation. Spleen: No focal abnormality.  Normal size. Adrenals/Urinary Tract: No adrenal abnormality. No focal renal abnormality. No stones or hydronephrosis. Foley catheter in the bladder which is decompressed. Stomach/Bowel: NG tube in the fundus of the stomach. Stomach, large and small bowel grossly unremarkable. Vascular/Lymphatic: No evidence of aneurysm or adenopathy. Reproductive: No visible focal abnormality. Other: No free fluid or free air. Musculoskeletal: No acute bony abnormality. IMPRESSION: Bilateral lower lobe airspace opacities with air bronchograms could reflect pneumonia. Aspiration is possible. No acute findings in the abdomen or pelvis. Electronically Signed   By: Franky Crease M.D.   On: 07/03/2024 22:24   DG Abd 1 View Result Date: 07/03/2024 CLINICAL DATA:  Ileus. EXAM: ABDOMEN - 1 VIEW COMPARISON:  07/02/2024 FINDINGS: Interval progression of gaseous distension of  the transverse colon with gas distended small bowel in the right abdomen. Bowel gas pattern is not frankly obstructive. Visualized bony anatomy unremarkable. NG tube tip is in the stomach with proximal side port positioned in the distal esophagus. IMPRESSION: 1. Interval progression of gaseous distension of the transverse colon with gas distended small bowel in the right abdomen. Imaging features compatible with ileus. 2. NG tube tip is in the stomach with proximal side port positioned in the distal esophagus. NG tube could be advanced about 5 cm to place the side port below the GE junction. Repeat x-ray recommended after repositioning. Electronically Signed   By: Camellia Candle M.D.   On: 07/03/2024 09:12   MR BRAIN WO CONTRAST Result Date: 07/02/2024 CLINICAL DATA:  Anoxic brain damage.  Cardiac arrest. EXAM: MRI HEAD WITHOUT CONTRAST TECHNIQUE: Multiplanar, multiecho pulse sequences of the brain and surrounding structures were obtained without intravenous contrast. COMPARISON:  Head CT 06/29/2024 FINDINGS: Brain: There is widespread, symmetrically restricted diffusion throughout cortex and subcortical white matter of the majority of both cerebral hemispheres with some relative sparing primarily anteriorly in the frontal and temporal lobes. The basal ganglia and thalami are also diffusely involved. There is associated T2 FLAIR hyperintensity with mild swelling but no midline shift or other evidence of herniation. The brainstem and cerebellum appear spared. No intracranial hemorrhage, hydrocephalus, or extra-axial fluid collection is identified. A normal variant mega cisterna magna is noted. 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 Vascular: Major intracranial vascular flow voids are preserved. Skull and upper cervical spine: No suspicious marrow lesion. Sinuses/Orbits: Unremarkable orbits.  Moderate mucosal thickening in the ethmoid and sphenoid sinuses. Trace right and small left mastoid effusions. Other: None. IMPRESSION: Widespread supratentorial restricted diffusion consistent with hypoxic-ischemic injury. Electronically Signed   By: Dasie Hamburg M.D.   On: 07/02/2024 18:32   DG Abd 1 View Result Date: 07/02/2024 CLINICAL DATA:  Ileus EXAM: ABDOMEN - 1 VIEW COMPARISON:  06/29/2024 FINDINGS: 2 supine frontal views of the abdomen and pelvis are obtained. Enteric catheter identified, tip in the region of the gastric body. Contrast is seen outlining the gastric rugal folds. No transit of oral contrast into the small bowel or colon. No evidence of high-grade bowel obstruction or ileus. No masses or abnormal calcifications. Persistent retrocardiac consolidation may reflect airspace disease or atelectasis. IMPRESSION: 1. Enteric catheter tip in the region of the gastric body. 2. Oral contrast outlining the gastric rugal folds. 3. Unremarkable bowel gas pattern. 4. Persistent left lower lobe consolidation. Electronically Signed   By: Ozell Daring M.D.   On: 07/02/2024 10:57   VAS US  LOWER EXTREMITY VENOUS (DVT) Result Date: 07/01/2024  Lower Venous DVT Study Patient Name:  Mike Sellers  Date of Exam:   06/30/2024 Medical Rec #: [id number]       Accession #:    [id number] Date of Birth: 11/16/1874      Patient Gender: M Patient Age:   89 years Exam Location:  [facility] Procedure:      VAS US  LOWER EXTREMITY VENOUS (DVT) Referring Phys: TORIBIO SHARPS --------------------------------------------------------------------------------  Indications: Edema, and Cardiac arrest.  Limitations: [place], bandages. Performing Technologist: Alberta Lis RVS  Examination Guidelines: A complete evaluation includes B-mode imaging, spectral Doppler, color Doppler, and power Doppler as needed of all accessible portions of each vessel. Bilateral testing is considered an integral part of a complete  examination. Limited examinations for reoccurring indications may be performed as noted. The reflux portion of the exam is performed with the patient in reverse Trendelenburg.  +---------+---------------+---------+-----------+----------+--------------+ RIGHT    CompressibilityPhasicitySpontaneityPropertiesThrombus Aging +---------+---------------+---------+-----------+----------+--------------+ CFV      Full           Yes      Yes                                 +---------+---------------+---------+-----------+----------+--------------+ SFJ      Full                                                        +---------+---------------+---------+-----------+----------+--------------+ FV Prox  Full           Yes      Yes                                 +---------+---------------+---------+-----------+----------+--------------+ FV Mid   Full                                                        +---------+---------------+---------+-----------+----------+--------------+  FV DistalFull           Yes      Yes                                 +---------+---------------+---------+-----------+----------+--------------+ PFV      Full           Yes      Yes                                 +---------+---------------+---------+-----------+----------+--------------+ POP      Full           Yes      Yes                                 +---------+---------------+---------+-----------+----------+--------------+ PTV      Full                                                        +---------+---------------+---------+-----------+----------+--------------+ PERO     Full                                                        +---------+---------------+---------+-----------+----------+--------------+ Gastroc  Full                                                        +---------+---------------+---------+-----------+----------+--------------+    +---------+---------------+---------+-----------+----------+-------------------+ LEFT     CompressibilityPhasicitySpontaneityPropertiesThrombus Aging      +---------+---------------+---------+-----------+----------+-------------------+ CFV      Full           Yes      Yes                                      +---------+---------------+---------+-----------+----------+-------------------+ SFJ      Full                                                             +---------+---------------+---------+-----------+----------+-------------------+ FV Prox  Full           Yes      Yes                                      +---------+---------------+---------+-----------+----------+-------------------+ FV Mid   Full           Yes      Yes                                      +---------+---------------+---------+-----------+----------+-------------------+  FV DistalFull           Yes      Yes                                      +---------+---------------+---------+-----------+----------+-------------------+ PFV                                                   Not well visualized +---------+---------------+---------+-----------+----------+-------------------+ POP      Full           Yes      Yes                                      +---------+---------------+---------+-----------+----------+-------------------+ PTV      Full                                                             +---------+---------------+---------+-----------+----------+-------------------+ PERO     Full                                                             +---------+---------------+---------+-----------+----------+-------------------+     Summary: RIGHT: - There is no evidence of deep vein thrombosis in the lower extremity. However, portions of this examination were limited- see technologist comments above.  LEFT: - There is no evidence of deep vein thrombosis in the lower  extremity. However, portions of this examination were limited- see technologist comments above.  *See table(s) above for measurements and observations. Electronically signed by Penne Colorado MD on 07/01/2024 at 12:12:30 PM.    Final    Overnight EEG with video Result Date: 06/30/2024 Shelton Arlin KIDD, MD     07/01/2024 11:26 AM Patient Name: Mike Sellers MRN: [id number] Epilepsy Attending: Arlin KIDD Shelton Referring Physician/Provider: Khaliqdina, Salman, MD Duration: 06/30/2024 0015 to  07/01/2024 0015  Patient history: unknown age male s/p cardiac arrest. EEG to evaluate for seizure  Level of alertness: comatose  AEDs during EEG study: Propofol , LEV  Technical aspects: This EEG study was done with scalp electrodes positioned according to the 10-20 International system of electrode placement. Electrical activity was reviewed with band pass filter of 1-70Hz , sensitivity of 7 uV/mm, display speed of 41mm/sec with a 60Hz  notched filter applied as appropriate. EEG data were recorded continuously and digitally stored.  Video monitoring was available and reviewed as appropriate.  Description: EEG showed continuous generalized polymorphic 3 to 6 Hz theta-delta slowing. Hyperventilation and photic stimulation were not performed.   ABNORMALITY - Continuous slow, generalized IMPRESSION: This study is suggestive of moderate to severe diffuse encephalopathy. No seizures or epileptiform discharges were seen throughout the recording. Arlin KIDD Shelton   ECHOCARDIOGRAM COMPLETE Result Date: 06/30/2024    ECHOCARDIOGRAM REPORT   Patient Name:   Mike Sellers Date of Exam: 06/30/2024  Medical Rec #:  [id number]      Height:       68.0 in Accession #:    [id number]     Weight:       239.4 lb Date of Birth:  11/16/1874     BSA:          2.206 m Patient Age:    89 years      BP:           101/59 mmHg Patient Gender: M              HR:           90 bpm. Exam Location:  Inpatient Procedure: 2D Echo (Both Spectral and Color Flow  Doppler were utilized during            procedure). Indications:    cardiac arrest  History:        Patient has no prior history of Echocardiogram examinations.  Sonographer:    Tinnie Barefoot RDCS Referring Phys: [id number] [facility] M REESE  Sonographer Comments: Echo performed with patient supine and on artificial respirator and Technically difficult study due to poor echo windows. IMPRESSIONS  1. Left ventricular ejection fraction, by estimation, is 55 to 60%. The left ventricle has normal function. The left ventricle has no regional wall motion abnormalities.  2. Right ventricular systolic function mild to moderately reduced. The right ventricular size is mildly enlarged.  3. A small pericardial effusion is present.  4. The mitral valve is normal in structure. Trivial mitral valve regurgitation.  5. The aortic valve is tricuspid. Aortic valve regurgitation is not visualized.  6. The inferior vena cava is dilated in size with <50% respiratory variability, suggesting right atrial pressure of 15 mmHg. FINDINGS  Left Ventricle: Left ventricular ejection fraction, by estimation, is 55 to 60%. The left ventricle has normal function. The left ventricle has no regional wall motion abnormalities. The left ventricular internal cavity size was normal in size. There is  no left ventricular hypertrophy. Right Ventricle: The right ventricular size is mildly enlarged. Right vetricular wall thickness was not assessed. Right ventricular systolic function mild to moderately reduced. Left Atrium: Left atrial size was normal in size. Right Atrium: Right atrial size was normal in size. Pericardium: A small pericardial effusion is present. Mitral Valve: The mitral valve is normal in structure. Trivial mitral valve regurgitation. Tricuspid Valve: The tricuspid valve is normal in structure. Tricuspid valve regurgitation is trivial. Aortic Valve: The aortic valve is tricuspid. Aortic valve regurgitation is not visualized. Pulmonic  Valve: The pulmonic valve was not well visualized. Pulmonic valve regurgitation is not visualized. No evidence of pulmonic stenosis. Aorta: The aortic root is normal in size and structure. Venous: The inferior vena cava is dilated in size with less than 50% respiratory variability, suggesting right atrial pressure of 15 mmHg. IAS/Shunts: No atrial level shunt detected by color flow Doppler.  LEFT VENTRICLE PLAX 2D LVIDd:         4.50 cm   Diastology LVIDs:         3.20 cm   LV e' medial:    7.83 cm/s LV PW:         0.90 cm   LV E/e' medial:  5.4 LV IVS:        1.10 cm   LV e' lateral:   7.18 cm/s LVOT diam:     1.80 cm   LV E/e' lateral: 5.8 LV SV:  29 LV SV Index:   13 LVOT Area:     2.54 cm  RIGHT VENTRICLE             IVC RV Basal diam:  2.80 cm     IVC diam: 2.30 cm RV S prime:     10.60 cm/s TAPSE (M-mode): 1.8 cm LEFT ATRIUM           Index       RIGHT ATRIUM           Index LA diam:      2.50 cm 1.13 cm/m  RA Area:     12.40 cm LA Vol (A4C): 21.4 ml 9.70 ml/m  RA Volume:   30.10 ml  13.64 ml/m  AORTIC VALVE LVOT Vmax:   68.70 cm/s LVOT Vmean:  45.600 cm/s LVOT VTI:    0.113 m  AORTA Ao Root diam: 2.80 cm MITRAL VALVE MV Area (PHT): 3.60 cm    SHUNTS MV Decel Time: 211 msec    Systemic VTI:  0.11 m MV E velocity: 41.90 cm/s  Systemic Diam: 1.80 cm MV A velocity: 48.20 cm/s MV E/A ratio:  0.87 Vina Gull MD Electronically signed by Vina Gull MD Signature Date/Time: 06/30/2024/11:41:14 AM    Final    DG Chest Port 1 View Result Date: 06/30/2024 EXAM: 1 VIEW XRAY OF THE CHEST 06/30/2024 05:44:00 AM COMPARISON: 06/29/2024 CLINICAL HISTORY: Aspiration. FINDINGS: LUNGS AND PLEURA: Persistent asymmetric increased opacification within the left lower lung which appears improved from the previous exam. No significant pleural effusion or pneumothorax. Right lung appears clear. HEART AND MEDIASTINUM: Stable cardiomediastinal contours. BONES AND SOFT TISSUES: No acute osseous abnormality. LINES AND TUBES:  Stable position of the endotracheal tube with tip above the carina. The enteric tube tip is in the expected location at the gastric fundus. Left IJ catheter is stable in position with tip over the cavoatrial junction. IMPRESSION: 1. Persistent asymmetric increased opacification within the left lower lung, improved from the previous exam. 2. No significant pleural effusion or pneumothorax. Electronically signed by: Waddell Calk MD 06/30/2024 06:46 AM EDT RP Workstation: [id number]   EEG adult Result Date: 06/29/2024 Shelton Arlin KIDD, MD     06/29/2024  8:18 PM Patient Name: Mike Sellers MRN: [id number] Epilepsy Attending: Arlin KIDD Shelton Referring Physician/Provider: Harold Scholz, MD Date: 06/29/2024 Duration: 21.28 mins Patient history: unknown age male s/p cardiac arrest. EEG to evaluate for seizure Level of alertness: comatose AEDs during EEG study: Propofol  Technical aspects: This EEG study was done with scalp electrodes positioned according to the 10-20 International system of electrode placement. Electrical activity was reviewed with band pass filter of 1-70Hz , sensitivity of 7 uV/mm, display speed of 65mm/sec with a 60Hz  notched filter applied as appropriate. EEG data were recorded continuously and digitally stored.  Video monitoring was available and reviewed as appropriate. Description: EEG showed burst suppression with epileptiform bursts lasting 0.5-1 seconds alternating with 8-12 seconds of generalized background suppression. Hyperventilation and photic stimulation were not performed.   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 No seizures were seen throughout the recording. Arlin KIDD Shelton   DG Abd 1 View Result Date: 06/29/2024 CLINICAL DATA:  Orogastric tube placement. EXAM: ABDOMEN - 1 VIEW COMPARISON:  Earlier today  FINDINGS: The enteric tube has been advanced, the side port is now below the diaphragm in the stomach. Nonobstructive upper abdominal  bowel gas pattern. IMPRESSION: Advancement of enteric tube, the side port is now below the diaphragm in the stomach. Electronically Signed   By: Andrea Gasman M.D.   On: 06/29/2024 16:16   DG Abd 1 View Result Date: 06/29/2024 CLINICAL DATA:  Orogastric tube placement. EXAM: ABDOMEN - 1 VIEW COMPARISON:  None Available. FINDINGS: Tip of the enteric tube below the diaphragm in the stomach, the side port is just at or beyond the gastroesophageal junction. Nonobstructive upper abdominal bowel gas pattern. IMPRESSION: Tip of the enteric tube below the diaphragm in the stomach, the side port is just at or beyond the gastroesophageal junction. Electronically Signed   By: Andrea Gasman M.D.   On: 06/29/2024 16:03   DG CHEST PORT 1 VIEW Result Date: 06/29/2024 CLINICAL DATA:  [id number] Encounter for central line placement [id number] EXAM: PORTABLE CHEST 1 VIEW COMPARISON:  Same day chest radiograph dated 06/29/2024 at 9 a.m. FINDINGS: Left IJ CVC catheter tip overlies the lower SVC. Endotracheal tube tip is located 4 cm above the carina. Enteric tube side port at the level of the GE junction, recommend advancement by 3 6 cm. No pneumothorax. The heart size and mediastinal contours are unchanged. Mild left basilar atelectasis. Otherwise, no significant change. IMPRESSION: 1. Left IJ CVC catheter tip overlies the lower SVC. 2. Enteric tube side port at the level of the GE junction, recommend advancement by 3 6 cm. 3. Endotracheal tube tip is located 4 cm above the carina. 4. Mild left basilar atelectasis. Electronically Signed   By: Harrietta Sherry M.D.   On: 06/29/2024 13:47   DG Chest Portable 1 View Result Date: 06/29/2024 CLINICAL DATA:  Cervical spine CT today. EXAM: PORTABLE CHEST 1 VIEW COMPARISON:  Portable AP supine view at 0900 hours. Enteric tube courses to the left upper  quadrant, appears to loop there and the stomach is distended with gas. Endotracheal tube tip appears to terminates above the carina on this image. Normal cardiac size and mediastinal contours. Left lower chest pacer/resuscitation pad. Allowing for portable technique the lungs are clear. No pneumothorax or pleural effusion identified on this supine view. No displaced rib fracture identified. FINDINGS: The heart size and mediastinal contours are within normal limits. Both lungs are clear. The visualized skeletal structures are unremarkable. IMPRESSION: 1. ETT tip position appears satisfactory on this image, enteric tube looped in the stomach which is distended with gas. 2. No acute cardiopulmonary abnormality identified. Electronically Signed   By: VEAR Hurst M.D.   On: 06/29/2024 10:03   CT Cervical Spine Wo Contrast Result Date: 06/29/2024 CLINICAL DATA:  Male of unknown age, found down, asystole, CPR, intubated. EXAM: CT CERVICAL SPINE WITHOUT CONTRAST TECHNIQUE: Multidetector CT imaging of the cervical spine was performed without intravenous contrast. Multiplanar CT image reconstructions were also generated. RADIATION DOSE REDUCTION: This exam was performed according to the departmental dose-optimization program which includes automated exposure control, adjustment of the mA and/or kV according to patient size and/or use of iterative reconstruction technique. COMPARISON:  Head CT today. FINDINGS: Alignment: Straightening of cervical lordosis. Cervicothoracic junction alignment is within normal limits. Bilateral posterior element alignment is within normal limits. Skull base and vertebrae: Bone mineralization is within normal limits. Visualized skull base is intact. No atlanto-occipital dissociation. C1 and C2 appear intact and aligned. No osseous abnormality identified. Soft tissues and spinal canal: No prevertebral fluid or swelling. No visible canal hematoma. Intubated, satisfactory course of the visible ET tube  into the subglottic trachea. Enteric tube courses into  the esophagus after briefly looping in the pharynx, which does not appear agree just. Negative other visible noncontrast neck soft tissues. Disc levels:  Negative. Upper chest: Endotracheal tube tip may extend to the carina on series 4, image 125. Enteric tube within the esophagus. Confluent dependent lung opacity in the apices. Visible upper thoracic levels appear intact. IMPRESSION: 1. No acute traumatic injury identified in the cervical spine. 2. Endotracheal tube tip may extend to the carina. Enteric tube courses into the esophagus after briefly looping in the pharynx - satisfactory and does not require repositioning. 3. Confluent dependent lung opacity in the apices, possibly Aspiration. Electronically Signed   By: VEAR Hurst M.D.   On: 06/29/2024 10:01   CT HEAD WO CONTRAST Result Date: 06/29/2024 CLINICAL DATA:  Male of unknown age, found down, asystole, CPR, intubated. EXAM: CT HEAD WITHOUT CONTRAST TECHNIQUE: Contiguous axial images were obtained from the base of the skull through the vertex without intravenous contrast. RADIATION DOSE REDUCTION: This exam was performed according to the departmental dose-optimization program which includes automated exposure control, adjustment of the mA and/or kV according to patient size and/or use of iterative reconstruction technique. COMPARISON:  Head CT 07/10/2020. FINDINGS: Brain: Maintained gray-white differentiation. Relatively normal cerebral volume. No midline shift, ventriculomegaly, mass effect, evidence of mass lesion, intracranial hemorrhage or evidence of cortically based acute infarction. Vascular: No suspicious intracranial vascular hyperdensity. Skull: No fracture identified. Sinuses/Orbits: Intubated, mild paranasal sinus mucosal thickening. Tympanic cavities and mastoids remain clear. Other: No acute orbit or scalp soft tissue injury identified. Oral enteric tube is looped in the pharynx on series  4, image 1. IMPRESSION: 1. No acute intracranial abnormality by CT. No acute traumatic injury identified. 2. Oral enteric tube looped in the pharynx, recommend repositioning. Electronically Signed   By: VEAR Hurst M.D.   On: 06/29/2024 09:59   DG Abd Portable 1V Result Date: 06/29/2024 CLINICAL DATA:  Endotracheal and nasogastric tube placement. History of cardiac arrest. EXAM: PORTABLE ABDOMEN - 1 VIEW COMPARISON:  None Available. FINDINGS: The bowel gas pattern is non-obstructive. No evidence of pneumoperitoneum, within the limitations of a supine film. No acute osseous abnormalities. The soft tissues are within normal limits. Surgical changes, devices, tubes and lines: Multiple monitoring electrodes seen overlying the upper abdomen. Enteric tube is not discretely seen. Please refer to contemporaneously obtained chest radiograph for details. IMPRESSION: Nonobstructive bowel gas pattern. Electronically Signed   By: Ree Molt M.D.   On: 06/29/2024 08:53   DG Chest Port 1 View Result Date: 06/29/2024 CLINICAL DATA:  Endotracheal and OG tube placement. History of cardiac arrest. EXAM: PORTABLE CHEST 1 VIEW COMPARISON:  04/27/2022. FINDINGS: Left lateral lower hemithorax is not included in the film. Visualized bilateral lung fields are grossly clear. Right lateral costophrenic angle is clear. Normal cardio-mediastinal silhouette. No acute osseous abnormalities. The soft tissues are within normal limits. Endotracheal tube is seen with its tip approximately 3 cm above the carina. Enteric tube is seen coursing below the left hemidiaphragm with its tip overlying the left upper quadrant, overlying the fundus of the stomach region. The side hole is likely at or just above the left hemidiaphragm level. Consider further advancement by 2-3 inches to confidently put the side hole into the stomach. IMPRESSION: No active disease. Support apparatus, as described above. Electronically Signed   By: Ree Molt M.D.   On:  06/29/2024 08:51    Microbiology Recent Results (from the past 240 hours)  MRSA Next Gen by PCR, Nasal  Status: None   Collection Time: 07/04/24 12:05 AM   Specimen: Nasal Mucosa; Nasal Swab  Result Value Ref Range Status   MRSA by PCR Next Gen NOT DETECTED NOT DETECTED Final    Comment: (NOTE) The GeneXpert MRSA Assay (FDA approved for NASAL specimens only), is one component of a comprehensive MRSA colonization surveillance program. It is not intended to diagnose MRSA infection nor to guide or monitor treatment for MRSA infections. Test performance is not FDA approved in patients less than 75 years old. Performed at [facility], [address] [place], [place] [address]   Culture, blood (Routine X 2) w Reflex to ID Panel     Status: None   Collection Time: 07/04/24  2:44 AM   Specimen: BLOOD LEFT HAND  Result Value Ref Range Status   Specimen Description BLOOD LEFT HAND  Final   Special Requests   Final    BOTTLES DRAWN AEROBIC AND ANAEROBIC Blood Culture adequate volume   Culture   Final    NO GROWTH 5 DAYS Performed at [facility], [address] [place], [place] [address]    Report Status 07/09/2024 FINAL  Final  Urine Culture (for pregnant, neutropenic or urologic patients or patients with an indwelling urinary catheter)     Status: None   Collection Time: 07/04/24  2:46 AM   Specimen: Urine, Clean Catch  Result Value Ref Range Status   Specimen Description URINE, CLEAN CATCH  Final   Special Requests NONE  Final   Culture   Final    NO GROWTH Performed at [facility], [address], [place], [place] [address]    Report Status 07/05/2024 FINAL  Final  Culture, blood (Routine X 2) w Reflex to ID Panel     Status: None   Collection Time: 07/04/24  2:46 AM   Specimen: BLOOD RIGHT HAND  Result Value Ref Range Status   Specimen Description BLOOD RIGHT HAND  Final   Special Requests   Final    BOTTLES DRAWN AEROBIC AND ANAEROBIC Blood Culture  results may not be optimal due to an inadequate volume of blood received in culture bottles   Culture   Final    NO GROWTH 5 DAYS Performed at [facility], [address] [place], [place] [address]    Report Status 07/09/2024 FINAL  Final  Resp panel by RT-PCR (RSV, Flu A&B, Covid) Anterior Nasal Swab     Status: None   Collection Time: 07/04/24  2:46 AM   Specimen: Anterior Nasal Swab  Result Value Ref Range Status   SARS Coronavirus 2 by RT PCR NEGATIVE NEGATIVE Final   Influenza A by PCR NEGATIVE NEGATIVE Final   Influenza B by PCR NEGATIVE NEGATIVE Final    Comment: (NOTE) The Xpert Xpress SARS-CoV-2/FLU/RSV plus assay is intended as an aid in the diagnosis of influenza from Nasopharyngeal swab specimens and should not be used as a sole basis for treatment. Nasal washings and aspirates are unacceptable for Xpert Xpress SARS-CoV-2/FLU/RSV testing.  Fact Sheet for Patients: BloggerCourse.com  Fact Sheet for Healthcare Providers: SeriousBroker.it  This test is not yet approved or cleared by the [place]  FDA and has been authorized for detection and/or diagnosis of SARS-CoV-2 by FDA under an Emergency Use Authorization (EUA). This EUA will remain in effect (meaning this test can be used) for the duration of the COVID-19 declaration under Section 564(b)(1) of the Act, 21 U.S.C. section 360bbb-3(b)(1), unless the authorization  is terminated or revoked.     Resp Syncytial Virus by PCR NEGATIVE NEGATIVE Final    Comment: (NOTE) Fact Sheet for Patients: BloggerCourse.com  Fact Sheet for Healthcare Providers: SeriousBroker.it  This test is not yet approved or cleared by the [place]  FDA and has been authorized for detection and/or diagnosis of SARS-CoV-2 by FDA under an Emergency Use Authorization (EUA). This EUA will remain in effect (meaning this test can be  used) for the duration of the COVID-19 declaration under Section 564(b)(1) of the Act, 21 U.S.C. section 360bbb-3(b)(1), unless the authorization is terminated or revoked.  Performed at [facility], [address], [place], [place] [address]   Culture, Respiratory w Gram Stain     Status: None   Collection Time: 07/04/24  4:29 PM   Specimen: Bronchial Wash; Respiratory  Result Value Ref Range Status   Specimen Description BRONCHIAL WASHINGS  Final   Special Requests LEFT  Final   Gram Stain   Final    RARE WBC PRESENT, PREDOMINANTLY PMN NO ORGANISMS SEEN Performed at [facility], [address] [place], [place] [address]    Culture RARE STAPHYLOCOCCUS AUREUS  Final   Report Status 07/07/2024 FINAL  Final   Organism ID, Bacteria STAPHYLOCOCCUS AUREUS  Final      Susceptibility   Staphylococcus aureus - MIC*    CIPROFLOXACIN <=0.5 SENSITIVE Sensitive     ERYTHROMYCIN >=8 RESISTANT Resistant     GENTAMICIN <=0.5 SENSITIVE Sensitive     OXACILLIN 0.5 SENSITIVE Sensitive     TETRACYCLINE <=1 SENSITIVE Sensitive     VANCOMYCIN  1 SENSITIVE Sensitive     TRIMETH/SULFA <=10 SENSITIVE Sensitive     CLINDAMYCIN <=0.25 SENSITIVE Sensitive     RIFAMPIN <=0.5 SENSITIVE Sensitive     Inducible Clindamycin NEGATIVE Sensitive     LINEZOLID 2 SENSITIVE Sensitive     * RARE STAPHYLOCOCCUS AUREUS    Lab Basic Metabolic Panel: Recent Labs  Lab 07/05/24 1122 07/05/24 1211 07/05/24 1508 07/05/24 1630 07/05/24 2214 07/05/24 2320 2024-07-25 0418 2024/07/25 0454 25-Jul-2024 0732 Jul 25, 2024 0821 07-25-24 1137 2024-07-25 1143 2024-07-25 1546  NA 140   < > 139   < > 139   < > 139   < > 139 137 137 137 136  K 3.8   < > 3.7   < > 3.9   < > 4.2   < > 4.4 4.2 4.3 4.2 4.1  CL 103  --  99  --  100  --  100  --   --   --  101  --   --   CO2 26  --  27  --  27  --  26  --   --   --  26  --   --   GLUCOSE 93  --  106*  --  109*  --  107*  --   --   --  99  --   --   BUN 14  --  14  --  14  --   14  --   --   --  17  --   --   CREATININE 0.83  --  0.83  --  0.86  --  0.89  --   --   --  0.96  --   --   CALCIUM  8.3*  --  8.6*  --  8.9  --  8.8*  --   --   --  8.8*  --   --  MG 1.8  --  1.8  --  1.8  --  2.0  --   --   --  1.9  --   --   PHOS 4.9*  --  4.9*  --  4.4  --  4.2  --   --   --  3.6  --   --    < > = values in this interval not displayed.   Liver Function Tests: Recent Labs  Lab 07/05/24 1122 07/05/24 1508 07/05/24 2214 07/26/2024 0418 2024-07-26 1137  AST 61* 65* 67* 60* 56*  ALT 177* 183* 179* 157* 131*  ALKPHOS 68 75 77 74 69  BILITOT 0.8 1.0 1.0 1.0 1.0  PROT 5.5* 6.3* 6.7 6.5 6.2*  ALBUMIN  2.4* 2.7* 2.9* 2.7* 2.6*   Recent Labs  Lab 07/05/24 1122 07/05/24 1508 07/05/24 2214 2024/07/26 0418 07-26-2024 1137  LIPASE 83* 78* 82* 79* 90*  AMYLASE 62 61 66 65 70   No results for input(s): AMMONIA in the last 168 hours. CBC: Recent Labs  Lab 07/05/24 1122 07/05/24 1211 07/05/24 1508 07/05/24 1630 07/05/24 2214 07/05/24 2320 07-26-24 0418 Jul 26, 2024 0454 07/26/2024 0732 26-Jul-2024 0821 Jul 26, 2024 1137 26-Jul-2024 1143 2024/07/26 1546  WBC 9.8  --  10.1  --  10.2  --  9.3  --   --   --  10.4  --   --   HGB 12.5*   < > 13.8   < > 14.5   < > 14.0   < > 12.9* 12.9* 13.3 12.2* 12.2*  HCT 37.2*   < > 40.4   < > 42.5   < > 41.1   < > 38.0* 38.0* 40.3 36.0* 36.0*  MCV 84.5  --  83.6  --  84.2  --  84.4  --   --   --  85.4  --   --   PLT 246  --  258  --  272  --  256  --   --   --  266  --   --    < > = values in this interval not displayed.   Cardiac Enzymes: Recent Labs  Lab 07/04/24 0631 07/04/24 1638 07/05/24 0257 07/05/24 2214 07/26/2024 0418 2024/07/26 1006 07-26-24 1436  CKTOTAL 422*   < > 238 226 196 142 115  CKMB 2.2  --   --   --   --   --   --    < > = values in this interval not displayed.   Sepsis Labs: Recent Labs  Lab 07/04/24 0005 07/04/24 0457 07/04/24 0458 07/04/24 1114 07/05/24 1508 07/05/24 2214 07-26-24 0418 2024-07-26 1137  WBC  9.7   < >  --    < > 10.1 10.2 9.3 10.4  LATICACIDVEN 1.0  --  0.7  --   --   --   --   --    < > = values in this interval not displayed.    Procedures/Operations  CV[facility] Aline Bronchoscopy    Leita SHAUNNA Gaskins 07/10/2024, 7:33 PM

## 2024-07-17 NOTE — Progress Notes (Signed)
 Nutrition Brief Note  Chart reviewed. Pt DNR with plans for comfort care and OR this evening for organ donation.   No further nutrition interventions planned at this time.  Please re-consult as needed.   Betsey Finger MS, RDN, LDN, CNSC Registered Dietitian 3 Clinical Nutrition RD Inpatient Contact Info in Amion

## 2024-07-17 NOTE — Anesthesia Postprocedure Evaluation (Signed)
 Anesthesia Post Note  Patient: Mike Sellers  Procedure(s) Performed: CANCELLED PROCEDURE     Patient location during evaluation: ICU Anesthesia Type: MAC Post-procedure mental status: unresponsive. Anesthetic complications: no Comments: Pt was extubated in the OR for organ procurement. After 90 minutes the patient had not passed away so he was taken back to the ICU.   No notable events documented.  Last Vitals:  Vitals:   08-03-24 1500 08-03-2024 1600  BP:    Pulse: 66 63  Resp: 18 20  Temp: 37.4 C 37.5 C  SpO2: 97% 99%    Last Pain:  Vitals:   08-03-2024 1200  TempSrc: Esophageal                 Neven Fina,W. EDMOND

## 2024-07-17 NOTE — Progress Notes (Signed)
 NAME:  Mike Sellers, MRN:  [id number], DOB:  1991/09/05, LOS: 7 ADMISSION DATE:  06/29/2024 CONSULTATION DATE:  06/29/2024 REFERRING MD:  Tegeler - EDP, CHIEF COMPLAINT:  Post-cardiac arrest, found down   History of Present Illness:  Unknown age man who presented to [facility] ED via EMS 8/14 after OOH cardiac arrest with unknown downtime. PMHx unknown.  Patient was reportedly found down outside of the [employer] with drug paraphernalia on site (crack pipe, lighters) with wet clothing/personal belongings. Per police, a Lexicographer reported seeing patient down around his arrival to work at 0400; at that time patient was breathing. On EMS arrival, patient was pulseless and apneic. Narcan was given without significant response. Initial rhythm was asystole and patient received CPR x 13 minutes, Epi x 2 with ROSC. Arrived to ED on Epi gtt. LMA placed. Intubated on arrival, no reported signs of responsiveness en route. On ED arrival, patient was afebrile, HR 80s, RR 25, BP 113/55 on Epi gtt, SpO2 100%. GCS 3.   Labs were notable for WBC 25.8, Hgb 14.7, Plt 231. INR 1.4. Na 145, K 3.9, CO2 16, BUN/Cr 11/2.57. Phos > 30. Mg 3.0. AST/ALT 872/1256, Alk Phos 99, Tbili 0.7. Trop 110. UA gluc 50, mod Hgb, prot 100. UDS +amphetamines. Initial CXR unremarkable. CT Head NAICA, CT C-Spine negative for acute traumatic injury.  PCCM consulted for ICU admission.  Pertinent Medical History:  History reviewed. No pertinent past medical history.  Significant Hospital Events: Including procedures, antibiotic start and stop dates in addition to other pertinent events   8/14 - Presented to [facility] ED as OOH cardiac arrest, [name] with unknown downtime. Initial rhythm asystole, CPR x 13 min, Epi x 2, Epi gtt. Intubated on ED arrival and NE started. CXR unremarkable. Labs/imaging pending. 8/15 neuro exam poor, myclonus overnight, on 1mcg norepi 8/18 No improvement in neuro exam, police able to ID and mom coming in 8/19  Family has requested organ donation 8/20 discussions with [facility], continued severe myoclonus with sedation wean  14-Jul-2024 plan for comfort care, OR possible DCD   Interim History / Subjective:  NAEO   Objective:  Blood pressure 114/61, pulse 75, temperature 99.7 F (37.6 C), resp. rate 18, height 5' 8 (1.727 m), weight 100 kg, SpO2 95%.    Vent Mode: PRVC FiO2 (%):  [40 %-100 %] 40 % Set Rate:  [18 bmp] 18 bmp Vt Set:  [500 mL] 500 mL PEEP:  [5 cmH20] 5 cmH20 Plateau Pressure:  [13 cmH20-17 cmH20] 17 cmH20   Intake/Output Summary (Last 24 hours) at 07-14-2024 0824 Last data filed at 07-14-2024 0800 Gross per 24 hour  Intake 1769.51 ml  Output 6928 ml  Net -5158.49 ml   Filed Weights   07/04/24 0350 07/05/24 0500 2024/07/14 0400  Weight: 111.9 kg 108.7 kg 100 kg   General:  wdwn critically ill young adult M  HEENT: NCAT ETT secure  Neuro: + cough gag spontaneous respirations. Sedated  CV:rr cap refill < 3 sec  PULM:  Mechanically ventilated. Low  Vt with rapid rate on PSV  GI: soft   GU: foley Extremities: no obvious acute joint deformity    Resolved Hospital Problem List:  AKI Undifferentiated shock, cardiogenic versus septic component Hypomag Hypophos Hypoglycemia  Assessment & Plan:   Cardiac arrest Anoxic brain injury Acute respiratory failure with hypoxia Aspiration PNA Substance use disorder Elevated LFTs  Goals of care P -intent is for DCD, 2024/07/14 approx 1700 to OR  -coordinating with [facility]  re orders needed -- have d/w blood bank ask for 11 PRBC  -will discuss with pt mother -- at this time he remains full code and for DCD case we would need to discuss comfort care + DNR status  -cont MV, AEDs, meds to minimize myoclonus    Best Practice: (right click and Reselect all SmartList Selections daily)   Diet/type: NPO DVT prophylaxis: SCDs, SQH GI prophylaxis: PPI Lines: Central line and Arterial Line Foley:  Yes, and it is still needed Code  Status:  full code Last date of multidisciplinary goals of care discussion [see IPAL]  Labs:  CBC: Recent Labs  Lab 07/05/24 0501 07/05/24 0847 07/05/24 1122 07/05/24 1211 07/05/24 1508 07/05/24 1630 07/05/24 2214 07/05/24 2320 08-05-24 0418 Aug 05, 2024 0454 Aug 05, 2024 0732 05-Aug-2024 0821  WBC 9.3  --  9.8  --  10.1  --  10.2  --  9.3  --   --   --   HGB 12.6*   < > 12.5*   < > 13.8   < > 14.5 12.6* 14.0 13.3 12.9* 12.9*  HCT 37.6*   < > 37.2*   < > 40.4   < > 42.5 37.0* 41.1 39.0 38.0* 38.0*  MCV 84.3  --  84.5  --  83.6  --  84.2  --  84.4  --   --   --   PLT 224  --  246  --  258  --  272  --  256  --   --   --    < > = values in this interval not displayed.   Basic Metabolic Panel: Recent Labs  Lab 07/05/24 0501 07/05/24 0847 07/05/24 1122 07/05/24 1211 07/05/24 1508 07/05/24 1630 07/05/24 2214 07/05/24 2320 2024/08/05 0418 2024/08/05 0454 August 05, 2024 0732 2024/08/05 0821  NA 139   < > 140   < > 139   < > 139 140 139 139 139 137  K 3.7   < > 3.8   < > 3.7   < > 3.9 3.5 4.2 4.2 4.4 4.2  CL 103  --  103  --  99  --  100  --  100  --   --   --   CO2 26  --  26  --  27  --  27  --  26  --   --   --   GLUCOSE 95  --  93  --  106*  --  109*  --  107*  --   --   --   BUN 14  --  14  --  14  --  14  --  14  --   --   --   CREATININE 0.85  --  0.83  --  0.83  --  0.86  --  0.89  --   --   --   CALCIUM  8.2*  --  8.3*  --  8.6*  --  8.9  --  8.8*  --   --   --   MG 1.9  --  1.8  --  1.8  --  1.8  --  2.0  --   --   --   PHOS 5.4*  --  4.9*  --  4.9*  --  4.4  --  4.2  --   --   --    < > = values in this interval not displayed.   GFR: Estimated Creatinine Clearance: 136.5 mL/min (by C-G formula  based on SCr of 0.89 mg/dL). Recent Labs  Lab 06/29/24 1341 06/29/24 1558 06/30/24 0352 07/04/24 0005 07/04/24 0457 07/04/24 0458 07/04/24 1114 07/05/24 1122 07/05/24 1508 07/05/24 2214 07/23/2024 0418  WBC  --   --    < > 9.7   < >  --    < > 9.8 10.1 10.2 9.3  LATICACIDVEN 3.7*  2.9*  --  1.0  --  0.7  --   --   --   --   --    < > = values in this interval not displayed.   Liver Function Tests: Recent Labs  Lab 07/05/24 0501 07/05/24 1122 07/05/24 1508 07/05/24 2214 July 23, 2024 0418  AST 67* 61* 65* 67* 60*  ALT 198* 177* 183* 179* 157*  ALKPHOS 67 68 75 77 74  BILITOT 0.8 0.8 1.0 1.0 1.0  PROT 5.6* 5.5* 6.3* 6.7 6.5  ALBUMIN  2.5* 2.4* 2.7* 2.9* 2.7*   Recent Labs  Lab 07/05/24 0501 07/05/24 1122 07/05/24 1508 07/05/24 2214 2024/07/23 0418  LIPASE 75* 83* 78* 82* 79*  AMYLASE 56 62 61 66 65   Recent Labs  Lab 07/01/24 0348  AMMONIA 36*    ABG:    Component Value Date/Time   PHART 7.432 07-23-2024 0821   PCO2ART 43.2 2024/07/23 0821   PO2ART 107 July 23, 2024 0821   HCO3 28.8 (H) July 23, 2024 0821   TCO2 30 07/23/24 0821   ACIDBASEDEF 1.0 06/30/2024 0043   O2SAT 98 July 23, 2024 0821    Coagulation Profile: Recent Labs  Lab 07/05/24 0501 07/05/24 1122 07/05/24 1508 07/05/24 2214 07-23-2024 0418  INR 1.1 1.1 1.1 1.1 1.1   Cardiac Enzymes: Recent Labs  Lab 07/04/24 0631 07/04/24 1638 07/04/24 2021 07/05/24 0257 07/05/24 2214 23-Jul-2024 0418  CKTOTAL 422* 324 287 238 226 196  CKMB 2.2  --   --   --   --   --     HbA1C: Hgb A1c MFr Bld  Date/Time Value Ref Range Status  06/30/2024 03:52 AM 5.6 4.8 - 5.6 % Final    Comment:    (NOTE) Diagnosis of Diabetes The following HbA1c ranges recommended by the American Diabetes Association (ADA) may be used as an aid in the diagnosis of diabetes mellitus.  Hemoglobin             Suggested A1C NGSP%              Diagnosis  <5.7                   Non Diabetic  5.7-6.4                Pre-Diabetic  >6.4                   Diabetic  <7.0                   Glycemic control for                       adults with diabetes.      CBG: Recent Labs  Lab 07/05/24 1600 07/05/24 1955 07/05/24 2343 23-Jul-2024 0337 July 23, 2024 0813  GLUCAP 108* 103* 110* 109* 105*   CRITICAL CARE Performed  by: Ronnald FORBES Gave   Total critical care time: 36 minutes  Critical care time was exclusive of separately billable procedures and treating other patients. Critical care was necessary to treat or prevent imminent or life-threatening deterioration.  Critical care was time  spent personally by me on the following activities: development of treatment plan with patient and/or surrogate as well as nursing, discussions with consultants, evaluation of patient's response to treatment, examination of patient, obtaining history from patient or surrogate, ordering and performing treatments and interventions, ordering and review of laboratory studies, ordering and review of radiographic studies, pulse oximetry and re-evaluation of patient's condition.  Ronnald Gave MSN, AGACNP-BC [facility] Amion for pager  2024-07-18, 8:24 AM

## 2024-07-17 NOTE — Progress Notes (Signed)
   2024-08-02 1726  Spiritual Encounters  Reason for visit End-of-life  OnCall Visit Yes   Attended [facility] another chaplain was providing spiritual care to family.

## 2024-07-17 NOTE — Progress Notes (Signed)
 Chaplain responded to page request for honor walk presence and prayer. Provided compassionate presence, prayer and emotional support. Family is large and very supportive of one another. Chaplains remain available as needs arise.

## 2024-07-17 NOTE — Anesthesia Preprocedure Evaluation (Addendum)
 Anesthesia Evaluation  Patient identified by MRN, date of birth, ID band Patient unresponsive    Reviewed: Allergy & Precautions, Patient's Chart, lab work & pertinent test results, Unable to perform ROS - Chart review only  Airway Mallampati: Intubated       Dental   Pulmonary  VDRF: full vent support   breath sounds clear to auscultation       Cardiovascular  Rhythm:Regular Rate:Normal  07/04/2024 ECHO: EF 60 to 65%.  1. The LV has normal function, no regional wall motion abnormalities. Left ventricular diastolic parameters were normal.   2. RVF is normal. The right ventricular size is normal.   3. The mitral valve is normal in structure. No evidence of mitral valve regurgitation. No evidence of mitral stenosis.   4. The aortic valve is normal in structure. Aortic valve regurgitation is not visualized. No aortic stenosis is present.     Neuro/Psych Anoxic brain injury; MRI consistent with severe anoxic brain injury    GI/Hepatic ,,,(+)     substance abuse  OG in place   Endo/Other  BMI 33.5  Renal/GU negative Renal ROS     Musculoskeletal   Abdominal   Peds  Hematology   Anesthesia Other Findings Mike Sellers is a 33 y/o gentleman with a history of drug abuse who presented with cardiac arrest after being found down with unknown down time  Reproductive/Obstetrics                              Anesthesia Physical Anesthesia Plan  ASA: 6  Anesthesia Plan: MAC   Post-op Pain Management:    Induction:   PONV Risk Score and Plan:   Airway Management Planned:   Additional Equipment:   Intra-op Plan:   Post-operative Plan:   Informed Consent:      History available from chart only  Plan Discussed with: Surgeon and CRNA  Anesthesia Plan Comments: (Organ procurement: [employer] paperwork completed, family will be present)         Anesthesia Quick Evaluation

## 2024-07-17 NOTE — Progress Notes (Signed)
 Pt passed 1954, med boluses given are documented via [facility]. Other meds charted at given times.

## 2024-07-17 DEATH — deceased
